# Patient Record
Sex: Male | Born: 1965 | Race: Black or African American | Hispanic: No | Marital: Married | Smoking: Never smoker
Health system: Southern US, Community
[De-identification: ages and names within clinical notes are randomized; demographics above are authoritative.]

## PROBLEM LIST (undated history)

## (undated) DIAGNOSIS — T7840XA Allergy, unspecified, initial encounter: Secondary | ICD-10-CM

## (undated) DIAGNOSIS — Z91010 Allergy to peanuts: Secondary | ICD-10-CM

## (undated) DIAGNOSIS — S83209A Unspecified tear of unspecified meniscus, current injury, unspecified knee, initial encounter: Secondary | ICD-10-CM

## (undated) DIAGNOSIS — H409 Unspecified glaucoma: Secondary | ICD-10-CM

## (undated) DIAGNOSIS — S83249A Other tear of medial meniscus, current injury, unspecified knee, initial encounter: Secondary | ICD-10-CM

## (undated) HISTORY — DX: Unspecified tear of unspecified meniscus, current injury, unspecified knee, initial encounter: S83.209A

## (undated) HISTORY — DX: Allergy to peanuts: Z91.010

## (undated) HISTORY — DX: Allergy, unspecified, initial encounter: T78.40XA

## (undated) HISTORY — PX: EYE SURGERY: SHX253

---

## 2005-02-24 ENCOUNTER — Ambulatory Visit (HOSPITAL_COMMUNITY): Admission: RE | Admit: 2005-02-24 | Discharge: 2005-02-24 | Payer: Self-pay | Admitting: Cardiology

## 2006-08-05 ENCOUNTER — Emergency Department (HOSPITAL_COMMUNITY): Admission: EM | Admit: 2006-08-05 | Discharge: 2006-08-05 | Payer: Self-pay | Admitting: Emergency Medicine

## 2007-09-14 ENCOUNTER — Emergency Department (HOSPITAL_COMMUNITY): Admission: EM | Admit: 2007-09-14 | Discharge: 2007-09-15 | Payer: Self-pay | Admitting: Emergency Medicine

## 2010-01-13 ENCOUNTER — Emergency Department (HOSPITAL_COMMUNITY): Admission: EM | Admit: 2010-01-13 | Discharge: 2010-01-13 | Payer: Self-pay | Admitting: Family Medicine

## 2010-02-26 ENCOUNTER — Emergency Department (HOSPITAL_COMMUNITY): Admission: EM | Admit: 2010-02-26 | Discharge: 2010-02-27 | Payer: Self-pay | Admitting: Emergency Medicine

## 2010-04-06 ENCOUNTER — Encounter: Admission: RE | Admit: 2010-04-06 | Discharge: 2010-04-06 | Payer: Self-pay | Admitting: Family Medicine

## 2011-09-30 ENCOUNTER — Ambulatory Visit: Payer: BC Managed Care – PPO

## 2011-10-08 ENCOUNTER — Encounter (INDEPENDENT_AMBULATORY_CARE_PROVIDER_SITE_OTHER): Payer: BC Managed Care – PPO

## 2011-10-08 DIAGNOSIS — Z0000 Encounter for general adult medical examination without abnormal findings: Secondary | ICD-10-CM

## 2011-11-02 ENCOUNTER — Emergency Department (HOSPITAL_COMMUNITY)
Admission: EM | Admit: 2011-11-02 | Discharge: 2011-11-02 | Discharge status: Absent without leave | Payer: Self-pay | Source: Home / Self Care

## 2011-11-05 ENCOUNTER — Encounter: Payer: Self-pay | Admitting: Family Medicine

## 2011-11-07 ENCOUNTER — Telehealth: Payer: Self-pay

## 2011-11-07 NOTE — Telephone Encounter (Signed)
PT RETURNING A CALL FROM OUR OFFICE NOT SURE IF IT IS ABOUT LABS, ED RX THAT NEEDED PRIOR AUTH   PT PHONE NUMBER IS ONLY A TEMPORARY NUMBER [phone]

## 2011-11-18 NOTE — Telephone Encounter (Signed)
LM WITH LADY TO CB.  IT LOOKS LIKE IN HIS CHART THAT WE NEEDED TO KNOW IF HE WANTED TO TRY VIAGRA INSTEAD

## 2011-11-20 ENCOUNTER — Telehealth: Payer: Self-pay

## 2011-11-20 NOTE — Telephone Encounter (Signed)
Pt calling back concerning last phone message. Asked pt if he would like to try a Rx for Viagra since his insurance does not cover Cialis. Pt stated he would like a Rx sent to [facility], [place]. Told pt how to get a coupon for free sample off of the Viagra website. Dr Patsy Lager, do you want to RX?

## 2011-11-20 NOTE — Telephone Encounter (Signed)
LMOM for pt that we were trying to reach him back again about a Rx that he had requested. Asked pt to CB if he still needs anything.

## 2011-11-23 ENCOUNTER — Other Ambulatory Visit: Payer: Self-pay | Admitting: Family Medicine

## 2011-11-23 MED ORDER — SILDENAFIL CITRATE 100 MG PO TABS: 50.0000 mg | ORAL_TABLET | Freq: Every day | ORAL | Status: DC | PRN

## 2011-11-25 NOTE — Progress Notes (Signed)
Called and told wife that Rx was mailed to pt.

## 2011-11-26 NOTE — Telephone Encounter (Signed)
Dr Patsy Lager sent in Rx for Viagra on 11/23/11

## 2011-11-26 NOTE — Progress Notes (Signed)
This encounter has been completed °

## 2012-06-28 ENCOUNTER — Ambulatory Visit (INDEPENDENT_AMBULATORY_CARE_PROVIDER_SITE_OTHER): Admitting: Physician Assistant

## 2012-06-28 VITALS — BP 132/82 | HR 51 | Temp 98.7°F | Resp 18 | Ht 69.0 in | Wt 165.0 lb

## 2012-06-28 DIAGNOSIS — Z23 Encounter for immunization: Secondary | ICD-10-CM

## 2012-06-28 NOTE — Progress Notes (Signed)
  Subjective:    Patient ID: Alejandro Beltran, male    DOB: 03-16-1966, 46 y.o.   MRN: [id number]  HPI  Pt presents to clinic with the need for TDap.  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  Unsure of when his last tetanus was.  Review of Systems     Objective:   Physical Exam  Vitals reviewed. Constitutional: He is oriented to person, place, and time. He appears well-developed and well-nourished.  HENT:  Head: Normocephalic and atraumatic.  Right Ear: External ear normal.  Left Ear: External ear normal.  Pulmonary/Chest: Effort normal.  Neurological: He is alert and oriented to person, place, and time.  Skin: Skin is warm and dry.  Psychiatric: He has a normal mood and affect. His behavior is normal. Judgment and thought content normal.          Assessment & Plan:   1. Need for diphtheria-tetanus-pertussis (Tdap) vaccine, adult/adolescent  Tdap vaccine greater than or equal to 7yo IM

## 2012-10-08 ENCOUNTER — Encounter: Payer: Self-pay | Admitting: Family Medicine

## 2012-10-08 ENCOUNTER — Ambulatory Visit (INDEPENDENT_AMBULATORY_CARE_PROVIDER_SITE_OTHER): Payer: 59 | Admitting: Family Medicine

## 2012-10-08 VITALS — BP 114/64 | HR 55 | Temp 97.8°F | Resp 16 | Ht 67.25 in | Wt 163.4 lb

## 2012-10-08 DIAGNOSIS — R51 Headache: Secondary | ICD-10-CM

## 2012-10-08 MED ORDER — TRAMADOL HCL 50 MG PO TABS: 50.0000 mg | ORAL_TABLET | Freq: Three times a day (TID) | ORAL | Status: DC | PRN
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
## 2012-10-08 NOTE — Patient Instructions (Addendum)
Use the tramadol as needed for headache.  If the headache is becoming worse, is not resolved in the next 2 days, or is associated with any other symptoms such as nausea, vomiting or weakness please let us know or otherwise seek care right away.

## 2012-11-11 ENCOUNTER — Ambulatory Visit (INDEPENDENT_AMBULATORY_CARE_PROVIDER_SITE_OTHER): Payer: 59 | Admitting: Family Medicine

## 2012-11-11 VITALS — BP 127/83 | HR 59 | Temp 98.0°F | Resp 16 | Ht 67.5 in | Wt 163.8 lb

## 2012-11-11 DIAGNOSIS — S0591XA Unspecified injury of right eye and orbit, initial encounter: Secondary | ICD-10-CM | POA: Insufficient documentation

## 2012-11-11 LAB — COMPREHENSIVE METABOLIC PANEL
ALT: 30 U/L (ref 0–53)
AST: 37 U/L (ref 0–37)
Alkaline Phosphatase: 62 U/L (ref 39–117)
Calcium: 9.7 mg/dL (ref 8.4–10.5)
Chloride: 99 mEq/L (ref 96–112)
Potassium: 4.4 mEq/L (ref 3.5–5.3)
Total Bilirubin: 0.6 mg/dL (ref 0.3–1.2)

## 2012-11-11 LAB — POCT CBC
Granulocyte percent: 68.3 %G (ref 37–80)
HCT, POC: 49.4 % (ref 43.5–53.7)
Hemoglobin: 15.5 g/dL (ref 14.1–18.1)
MPV: 9 fL (ref 0–99.8)
POC MID %: 7.1 %M (ref 0–12)
Platelet Count, POC: 209 10*3/uL (ref 142–424)
RBC: 5.29 M/uL (ref 4.69–6.13)
RDW, POC: 13.5 %

## 2012-11-11 LAB — POCT URINALYSIS DIPSTICK
Glucose, UA: NEGATIVE
Nitrite, UA: NEGATIVE
Urobilinogen, UA: 0.2
pH, UA: 7

## 2012-11-11 LAB — POCT UA - MICROSCOPIC ONLY
Bacteria, U Microscopic: NEGATIVE
RBC, urine, microscopic: NEGATIVE
WBC, Ur, HPF, POC: NEGATIVE

## 2012-11-11 LAB — LIPID PANEL
Cholesterol: 184 mg/dL (ref 0–200)
HDL: 54 mg/dL (ref >39)
Triglycerides: 44 mg/dL (ref <150)

## 2012-11-11 NOTE — Progress Notes (Signed)
[facility] [address], [place] [address] [phone]  Date:  11/11/2012   Name:  Alejandro Beltran   DOB:  1965-10-04   MRN:  [id number]  PCP:  No primary provider on file.    Chief Complaint: Annual Exam   History of Present Illness:  Alejandro Beltran is a 47 y.o. very pleasant male patient who presents with the following:  Here for a CPE today.  He is generally very heathy. He has been exercising as much as possible and following a healthy lifestyle He is doing well.  He has had a couple of HA as of late, but advil got rid of his symptoms.   He is fasting today- he would like to have his health maintenance labs as well as STI screening today.  He has no new partners but would like to be tested to be sure all negative ROS sheet is negative except for episodic LBP which has been with him for years. No current flare.   There is no problem list on file for this patient.   History reviewed. No pertinent past medical history.  Past Surgical History  Procedure Laterality Date  . Eye surgery      History  Substance Use Topics  . Smoking status: Never Smoker   . Smokeless tobacco: Not on file  . Alcohol Use: 1.0 oz/week    2 drink(s) per week    Family History  Problem Relation Age of Onset  . Hypertension Mother   . Anuerysm Father   . Diabetes Maternal Grandmother   . Stroke Maternal Grandmother   . Cancer Maternal Grandfather     Allergies  Allergen Reactions  . Peanuts (Peanut Oil)     Exotic nuts    Medication list has been reviewed and updated.  Current Outpatient Prescriptions on File Prior to Visit  Medication Sig Dispense Refill  . Multiple Vitamin (MULTIVITAMIN) tablet Take 1 tablet by mouth daily.      . sildenafil (VIAGRA) 100 MG tablet Take 0.5-1 tablets (50-100 mg total) by mouth daily as needed for erectile dysfunction. Start with a half tablet- can take a whole tablet if necessary  5 tablet  11  . traMADol (ULTRAM) 50 MG  tablet Take 1 tablet (50 mg total) by mouth every 8 (eight) hours as needed for pain.  30 tablet  0   No current facility-administered medications on file prior to visit.    Review of Systems:  As per HPI- otherwise negative.   Physical Examination: Filed Vitals:   11/11/12 1526  BP: 127/83  Pulse: 59  Temp: 98 F (36.7 C)  Resp: 16   Filed Vitals:   11/11/12 1526  Height: 5' 7.5" (1.715 m)  Weight: 163 lb 12.8 oz (74.299 kg)   Body mass index is 25.26 kg/(m^2). Ideal Body Weight: Weight in (lb) to have BMI = 25: 161.7  GEN: WDWN, NAD, Non-toxic, A & O x 3, trim build HEENT: Atraumatic, Normocephalic. Neck supple. No masses, No LAD.  Bilateral TM wnl, oropharynx normal.  PEERL,EOMI.   Ears and Nose: No external deformity. CV: RRR, No M/G/R. No JVD. No thrill. No extra heart sounds. PULM: CTA B, no wheezes, crackles, rhonchi. No retractions. No resp. distress. No accessory muscle use. ABD: S, NT, ND, +BS. No rebound. No HSM. EXTR: No c/c/e NEURO Normal gait.  PSYCH: Normally interactive. Conversant. Not depressed or anxious appearing.  Calm demeanor.  Gu: normal scrotum and contents, normal penis, DRE  is normal, prostate smooth  Results for orders placed in visit on 11/11/12  POCT CBC      Result Value Range   WBC 7.7  4.6 - 10.2 K/uL   Lymph, poc 1.9  0.6 - 3.4   POC LYMPH PERCENT 24.6  10 - 50 %L   MID (cbc) 0.5  0 - 0.9   POC MID % 7.1  0 - 12 %M   POC Granulocyte 5.3  2 - 6.9   Granulocyte percent 68.3  37 - 80 %G   RBC 5.29  4.69 - 6.13 M/uL   Hemoglobin 15.5  14.1 - 18.1 g/dL   HCT, POC 09.8  11.9 - 53.7 %   MCV 93.3  80 - 97 fL   MCH, POC 29.3  27 - 31.2 pg   MCHC 31.4 (*) 31.8 - 35.4 g/dL   RDW, POC 14.7     Platelet Count, POC 209  142 - 424 K/uL   MPV 9.0  0 - 99.8 fL  POCT URINALYSIS DIPSTICK      Result Value Range   Color, UA yellow     Clarity, UA clear     Glucose, UA neg     Bilirubin, UA neg     Ketones, UA neg     Spec Grav, UA 1.010      Blood, UA trace-intact     pH, UA 7.0     Protein, UA neg     Urobilinogen, UA 0.2     Nitrite, UA neg     Leukocytes, UA Negative    POCT UA - MICROSCOPIC ONLY      Result Value Range   WBC, Ur, HPF, POC neg     RBC, urine, microscopic neg     Bacteria, U Microscopic neg     Mucus, UA neg     Epithelial cells, urine per micros neg     Crystals, Ur, HPF, POC neg     Casts, Ur, LPF, POC neg     Yeast, UA neg      Assessment and Plan: Physical exam, annual - Plan: POCT CBC, Comprehensive metabolic panel, Lipid panel, PSA, POCT urinalysis dipstick, POCT UA - Microscopic Only, GC/chlamydia probe amp, urine, HIV antibody, RPR, Hepatitis B surface antigen, Hepatitis C antibody  Right eye injury  healthy male here for CPE.  Await labs for further follow- up Mild microhematuria- will plan to recheck a UA in about one month. Discussed with patient and ordered future labs.   Abbe Amsterdam, MD

## 2012-11-11 NOTE — Patient Instructions (Addendum)
I will be in touch with your labs as soon as they come in.  Let me know if you have any other problems in the meantime

## 2012-11-12 ENCOUNTER — Encounter: Payer: Self-pay | Admitting: Family Medicine

## 2012-11-12 LAB — HEPATITIS B SURFACE ANTIGEN: Hepatitis B Surface Ag: NEGATIVE

## 2012-11-12 LAB — HEPATITIS C ANTIBODY: HCV Ab: NEGATIVE

## 2012-11-12 NOTE — Addendum Note (Signed)
Addended by: Abbe Amsterdam C on: 11/12/2012 11:14 AM   Modules accepted: Orders

## 2012-11-12 NOTE — Progress Notes (Signed)
Old PSA numbers- 09/2011 0.66; 08/2007 0.6

## 2013-01-25 ENCOUNTER — Other Ambulatory Visit: Payer: Self-pay | Admitting: Emergency Medicine

## 2013-01-25 DIAGNOSIS — M25562 Pain in left knee: Secondary | ICD-10-CM

## 2013-01-26 ENCOUNTER — Ambulatory Visit (HOSPITAL_COMMUNITY): Admission: RE | Admit: 2013-01-26 | Payer: 59 | Source: Ambulatory Visit

## 2013-01-28 ENCOUNTER — Other Ambulatory Visit (HOSPITAL_COMMUNITY): Payer: 59

## 2013-01-28 ENCOUNTER — Ambulatory Visit (HOSPITAL_COMMUNITY)
Admission: RE | Admit: 2013-01-28 | Discharge: 2013-01-28 | Disposition: A | Discharge status: Home / Self Care | Payer: 59 | Source: Ambulatory Visit | Attending: Emergency Medicine | Admitting: Emergency Medicine

## 2013-01-28 DIAGNOSIS — M2240 Chondromalacia patellae, unspecified knee: Secondary | ICD-10-CM | POA: Insufficient documentation

## 2013-01-28 DIAGNOSIS — M25469 Effusion, unspecified knee: Secondary | ICD-10-CM | POA: Insufficient documentation

## 2013-01-28 DIAGNOSIS — M25562 Pain in left knee: Secondary | ICD-10-CM

## 2013-01-28 DIAGNOSIS — M25569 Pain in unspecified knee: Secondary | ICD-10-CM | POA: Insufficient documentation

## 2013-01-28 DIAGNOSIS — M6789 Other specified disorders of synovium and tendon, multiple sites: Secondary | ICD-10-CM | POA: Insufficient documentation

## 2013-03-23 ENCOUNTER — Ambulatory Visit (INDEPENDENT_AMBULATORY_CARE_PROVIDER_SITE_OTHER): Payer: 59 | Admitting: Family Medicine

## 2013-03-23 VITALS — BP 142/90 | HR 61 | Temp 98.1°F | Resp 16 | Ht 67.5 in | Wt 159.6 lb

## 2013-03-23 DIAGNOSIS — K625 Hemorrhage of anus and rectum: Secondary | ICD-10-CM

## 2013-03-23 DIAGNOSIS — M79609 Pain in unspecified limb: Secondary | ICD-10-CM | POA: Diagnosis not present

## 2013-03-23 DIAGNOSIS — M25562 Pain in left knee: Secondary | ICD-10-CM

## 2013-03-23 DIAGNOSIS — R3129 Other microscopic hematuria: Secondary | ICD-10-CM | POA: Diagnosis not present

## 2013-03-23 DIAGNOSIS — M25569 Pain in unspecified knee: Secondary | ICD-10-CM | POA: Diagnosis not present

## 2013-03-23 DIAGNOSIS — Z889 Allergy status to unspecified drugs, medicaments and biological substances status: Secondary | ICD-10-CM

## 2013-03-23 DIAGNOSIS — M79661 Pain in right lower leg: Secondary | ICD-10-CM

## 2013-03-23 DIAGNOSIS — Z9109 Other allergy status, other than to drugs and biological substances: Secondary | ICD-10-CM

## 2013-03-23 LAB — IFOBT (OCCULT BLOOD): IFOBT: NEGATIVE

## 2013-03-23 LAB — POCT URINALYSIS DIPSTICK
Bilirubin, UA: NEGATIVE
Blood, UA: NEGATIVE
Glucose, UA: NEGATIVE
Ketones, UA: 15
Nitrite, UA: NEGATIVE
Protein, UA: NEGATIVE
Spec Grav, UA: 1.015
pH, UA: 6

## 2013-03-23 MED ORDER — EPINEPHRINE 0.3 MG/0.3ML IJ SOAJ: 0.3000 mg | Freq: Once | INTRAMUSCULAR | Status: DC

## 2013-03-23 NOTE — Patient Instructions (Addendum)
We will be in touch with you regarding your GI referral.  If you do not hear from [place] in the next week or so please let us know.   If any other problems please be in touch Please give your orthopedist a call and schedule a follow-up.

## 2013-03-23 NOTE — Progress Notes (Signed)
[facility] [address] [place] [address] [phone]  Date:  03/23/2013   Name:  Alejandro Beltran   DOB:  [birth year]   MRN:  [id number]  PCP:  Orson Aloe MD, Tanna Furry, MD    Chief Complaint: Leg Pain, Knee Pain, 3 weeks ago saw blood in stool - want to get check and Medication Refill   History of Present Illness:  Alejandro Beltran is a 47 y.o. very pleasant male patient who presents with the following:  He has noted some pain in his right knee since April of this year.  He went to [facility] ortho and has an MRI and had a cortisone shot.  He was told he has a meniscal tear (he thinks) per MRI. He felt "great" after the shot for a pretty good while.  He has been running for exercise and did a 10k about a month ago.  Then yesterday he noted return of the knee pain while he was doing yoga.  It is not giving way, and he is able to get around.  However, he is not sure what he should do next.    2 days ago he was running and somehow hurt his right calf. He was able to keep running, but did notice pain.  His calf still feels a little tight and sore.  He is supposed to run the [place] 10[place] this weekend, but is not sure if he will be able to do this.   About 3 weeks ago he noted some blood in his stool. He has bright red bleeding, just one episode.  He did not notice any pain.  The stool was not hard.  He has blood in the toilet bowl, and is not sure if it was mixed in his stool.    He also needs a refill of his epipen (peanut allergy) and needs to recheck his UA from his visit in February.    Patient Active Problem List   Diagnosis Date Noted  . Right eye injury 11/11/2012    History reviewed. No pertinent past medical history.  Past Surgical History  Procedure Laterality Date  . Eye surgery      History  Substance Use Topics  . Smoking status: Never Smoker   . Smokeless tobacco: Not on file  . Alcohol Use: 1.0 oz/week    2 drink(s) per week    Family History   Problem Relation Age of Onset  . Hypertension Mother   . Anuerysm Father   . Diabetes Maternal Grandmother   . Stroke Maternal Grandmother   . Cancer Maternal Grandfather     Allergies  Allergen Reactions  . Peanuts (Peanut Oil)     Exotic nuts    Medication list has been reviewed and updated.  Current Outpatient Prescriptions on File Prior to Visit  Medication Sig Dispense Refill  . Multiple Vitamin (MULTIVITAMIN) tablet Take 1 tablet by mouth daily.      . sildenafil (VIAGRA) 100 MG tablet Take 0.5-1 tablets (50-100 mg total) by mouth daily as needed for erectile dysfunction. Start with a half tablet- can take a whole tablet if necessary  5 tablet  11  . traMADol (ULTRAM) 50 MG tablet Take 1 tablet (50 mg total) by mouth every 8 (eight) hours as needed for pain.  30 tablet  0   No current facility-administered medications on file prior to visit.    Review of Systems:  As per HPI- otherwise negative.   Physical Examination:  Filed Vitals:   03/23/13 1834  BP: 142/90  Pulse: 61  Temp: 98.1 F (36.7 C)  Resp: 16   Filed Vitals:   03/23/13 1834  Height: 5' 7.5" (1.715 m)  Weight: 159 lb 9.6 oz (72.394 kg)   Body mass index is 24.61 kg/(m^2). Ideal Body Weight: Weight in (lb) to have BMI = 25: 161.7  GEN: WDWN, NAD, Non-toxic, A & O x 3, looks well.   HEENT: Atraumatic, Normocephalic. Neck supple. No masses, No LAD. Ears and Nose: No external deformity. CV: RRR, No M/G/R. No JVD. No thrill. No extra heart sounds. PULM: CTA B, no wheezes, crackles, rhonchi. No retractions. No resp. distress. No accessory muscle use. ABD: S, NT, ND, +BS. No rebound. No HSM. EXTR: No c/c/e NEURO Normal gait.   Normal LE strength, DTR bilaterally PSYCH: Normally interactive. Conversant. Not depressed or anxious appearing.  Calm demeanor.  Left knee: mild tenderness along the medial joint line. Normal flexion and extension.  No crepitus or effusion Right calf:  He has a small area of  tenderness along the central gastroc muscle.  Achilles is intact and non- tender.  Normal strength of the calf muscle.   Rectal exam: normal, no bleeding or hemorhoid noted  Results for orders placed in visit on 03/23/13  POCT URINALYSIS DIPSTICK      Result Value Range   Color, UA yellow     Clarity, UA clear     Glucose, UA neg     Bilirubin, UA neg     Ketones, UA 15     Spec Grav, UA 1.015     Blood, UA neg     pH, UA 6.0     Protein, UA neg     Urobilinogen, UA 0.2     Nitrite, UA neg     Leukocytes, UA Negative    IFOBT (OCCULT BLOOD)      Result Value Range   IFOBT Negative       Assessment and Plan: Knee pain, left - Plan: EPINEPHrine (EPIPEN) 0.3 mg/0.3 mL SOAJ  History of allergic reaction - Plan: EPINEPHrine (EPIPEN) 0.3 mg/0.3 mL SOAJ, Ambulatory referral to Gastroenterology  Rectal bleeding - Plan: IFOBT POC (occult bld, rslt in office)  Calf pain, right  Microhematuria - Plan: POCT urinalysis dipstick  He will call MW tomorrow and schedule a recheck.  As he is injured, advised him not to run the 10K this weekend.  He may need a repair of his meniscal tear as he is a young and active person Refilled epipen Calf strain: no evidence of a serious injury.  Encouraged relative rest and gentle stretching.  He is using a heat patch and a calf brace- may continue these measures as needed.  Referral to GI for rectal bleeding.  Hemorrhoid seems likely but he is close to 50, so suspect GI will favor a colonoscopy Microhematuria (on dip only, not on micro at last OV) is now resolved.    Signed Abbe Amsterdam, MD

## 2013-03-29 ENCOUNTER — Encounter: Payer: Self-pay | Admitting: Gastroenterology

## 2013-04-12 ENCOUNTER — Encounter: Payer: Self-pay | Admitting: Gastroenterology

## 2013-04-12 ENCOUNTER — Ambulatory Visit (INDEPENDENT_AMBULATORY_CARE_PROVIDER_SITE_OTHER): Payer: 59 | Admitting: Gastroenterology

## 2013-04-12 VITALS — BP 100/68 | HR 60 | Ht 67.5 in | Wt 163.8 lb

## 2013-04-12 DIAGNOSIS — K921 Melena: Secondary | ICD-10-CM

## 2013-04-12 MED ORDER — PEG-KCL-NACL-NASULF-NA ASC-C 100 G PO SOLR: 1.0000 | Freq: Once | ORAL | Status: DC

## 2013-04-12 NOTE — Patient Instructions (Addendum)
You have been scheduled for a colonoscopy with propofol. Please follow written instructions given to you at your visit today.  Please pick up your prep kit at the pharmacy within the next 1-3 days. If you use inhalers (even only as needed), please bring them with you on the day of your procedure. Your physician has requested that you go to www.startemmi.com and enter the access code given to you at your visit today. This web site gives a general overview about your procedure. However, you should still follow specific instructions given to you by our office regarding your preparation for the procedure.  Thank you for choosing me and [facility].  Malcolm T. Stark, Jr., MD., FACG  

## 2013-04-12 NOTE — Progress Notes (Signed)
History of Present Illness: This is a 47 year old male with a history of hematochezia associated with a bowel movement on one occasion about 2 months ago. It has not recurred. He has no other GI complaints. Denies weight loss, abdominal pain, constipation, diarrhea, change in stool caliber, melena, nausea, vomiting, dysphagia, reflux symptoms, chest pain.  Review of Systems: Pertinent positive and negative review of systems were noted in the above HPI section. All other review of systems were otherwise negative.  Current Medications, Allergies, Past Medical History, Past Surgical History, Family History and Social History were reviewed in [employer] record.  Physical Exam: General: Well developed , well nourished, no acute distress Head: Normocephalic and atraumatic Eyes:  sclerae anicteric, EOMI Ears: Normal auditory acuity Mouth: No deformity or lesions Neck: Supple, no masses or thyromegaly Lungs: Clear throughout to auscultation Heart: Regular rate and rhythm; no murmurs, rubs or bruits Abdomen: Soft, non tender and non distended. No masses, hepatosplenomegaly or hernias noted. Normal Bowel sounds Rectal: Deferred to colonoscopy Musculoskeletal: Symmetrical with no gross deformities  Skin: No lesions on visible extremities Pulses:  Normal pulses noted Extremities: No clubbing, cyanosis, edema or deformities noted Neurological: Alert oriented x 4, grossly nonfocal Cervical Nodes:  No significant cervical adenopathy Inguinal Nodes: No significant inguinal adenopathy Psychological:  Alert and cooperative. Normal mood and affect  Assessment and Recommendations:  1. Hematochezia. I suspect a benign anorectal source of bleeding such as hemorrhoids. Need to exclude colorectal neoplasms. The risks, benefits, and alternatives to colonoscopy with possible biopsy and possible polypectomy were discussed with the patient and they consent to proceed.

## 2013-04-13 ENCOUNTER — Ambulatory Visit (AMBULATORY_SURGERY_CENTER): Payer: 59 | Admitting: Gastroenterology

## 2013-04-13 ENCOUNTER — Encounter: Payer: Self-pay | Admitting: Gastroenterology

## 2013-04-13 VITALS — BP 110/76 | HR 56 | Temp 96.8°F | Resp 34 | Ht 67.5 in | Wt 163.0 lb

## 2013-04-13 DIAGNOSIS — K921 Melena: Secondary | ICD-10-CM

## 2013-04-13 MED ORDER — SODIUM CHLORIDE 0.9 % IV SOLN: 500.0000 mL | INTRAVENOUS | Status: DC

## 2013-04-13 NOTE — Progress Notes (Signed)
Procedure ends, to recovery, report given and VSS. 

## 2013-04-13 NOTE — Op Note (Signed)
[facility] [address] [place], [address]   COLONOSCOPY PROCEDURE REPORT  PATIENT: Alejandro, Beltran  MR#: [id number] BIRTHDATE: [birth year] , 47  yrs. old GENDER: Male ENDOSCOPIST: Meryl Dare, MD, [facility] REFERRED XB:[id number] Copland, M.D. PROCEDURE DATE:  04/13/2013 PROCEDURE:   Colonoscopy, diagnostic ASA CLASS:   Class I INDICATIONS:hematochezia. MEDICATIONS: MAC sedation, administered by CRNA and propofol (Diprivan) 240mg  IV DESCRIPTION OF PROCEDURE:   After the risks benefits and alternatives of the procedure were thoroughly explained, informed consent was obtained.  A digital rectal exam revealed no abnormalities of the rectum.   The LB [id number]  endoscope was introduced through the anus and advanced to the cecum, which was identified by both the appendix and ileocecal valve. No adverse events experienced.   The quality of the prep was good, using MoviPrep  The instrument was then slowly withdrawn as the colon was fully examined.  COLON FINDINGS: A normal appearing cecum, ileocecal valve, and appendiceal orifice were identified.  The ascending, hepatic flexure, transverse, splenic flexure, descending, sigmoid colon and rectum appeared unremarkable.  No polyps or cancers were seen. Retroflexed views revealed moderate internal hemorrhoids. The time to cecum=1 minutes 11 seconds.  Withdrawal time=8 minutes 39 seconds.  The scope was withdrawn and the procedure completed.  COMPLICATIONS: There were no complications.  ENDOSCOPIC IMPRESSION: 1.  Normal colon 2.  Moderate internal hemorrhoids  RECOMMENDATIONS: 1.  Continue current colorectal screening recommendations for "routine risk" patients with a repeat colonoscopy in 10 years.   eSigned:  Meryl Dare, MD, [facility] 04/13/2013 3:08 PM

## 2013-04-13 NOTE — Progress Notes (Signed)
Patient did not experience any of the following events: a burn prior to discharge; a fall within the facility; wrong site/side/patient/procedure/implant event; or a hospital transfer or hospital admission upon discharge from the facility. (G8907) Patient did not have preoperative order for IV antibiotic SSI prophylaxis. (G8918)  

## 2013-04-13 NOTE — Patient Instructions (Addendum)
YOU HAD AN ENDOSCOPIC PROCEDURE TODAY AT THE [facility]: Refer to the procedure report that was given to you for any specific questions about what was found during the examination.  If the procedure report does not answer your questions, please call your gastroenterologist to clarify.  If you requested that your care partner not be given the details of your procedure findings, then the procedure report has been included in a sealed envelope for you to review at your convenience later.  YOU SHOULD EXPECT: Some feelings of bloating in the abdomen. Passage of more gas than usual.  Walking can help get rid of the air that was put into your GI tract during the procedure and reduce the bloating. If you had a lower endoscopy (such as a colonoscopy or flexible sigmoidoscopy) you may notice spotting of blood in your stool or on the toilet paper. If you underwent a bowel prep for your procedure, then you may not have a normal bowel movement for a few days.  DIET: Your first meal following the procedure should be a light meal and then it is ok to progress to your normal diet.  A half-sandwich or bowl of soup is an example of a good first meal.  Heavy or fried foods are harder to digest and may make you feel nauseous or bloated.  Likewise meals heavy in dairy and vegetables can cause extra gas to form and this can also increase the bloating.  Drink plenty of fluids but you should avoid alcoholic beverages for 24 hours.  ACTIVITY: Your care partner should take you home directly after the procedure.  You should plan to take it easy, moving slowly for the rest of the day.  You can resume normal activity the day after the procedure however you should NOT DRIVE or use heavy machinery for 24 hours (because of the sedation medicines used during the test).    SYMPTOMS TO REPORT IMMEDIATELY: A gastroenterologist can be reached at any hour.  During normal business hours, 8:30 AM to 5:00 PM Monday through Friday,  call [phone].  After hours and on weekends, please call the GI answering service at [phone] who will take a message and have the physician on call contact you.   Following lower endoscopy (colonoscopy or flexible sigmoidoscopy):  Excessive amounts of blood in the stool  Significant tenderness or worsening of abdominal pains  Swelling of the abdomen that is new, acute  Fever of 100F or higher   FOLLOW UP: If any biopsies were taken you will be contacted by phone or by letter within the next 1-3 weeks.  Call your gastroenterologist if you have not heard about the biopsies in 3 weeks.  Our staff will call the home number listed on your records the next business day following your procedure to check on you and address any questions or concerns that you may have at that time regarding the information given to you following your procedure. This is a courtesy call and so if there is no answer at the home number and we have not heard from you through the emergency physician on call, we will assume that you have returned to your regular daily activities without incident.  SIGNATURES/CONFIDENTIALITY: You and/or your care partner have signed paperwork which will be entered into your electronic medical record.  These signatures attest to the fact that that the information above on your After Visit Summary has been reviewed and is understood.  Full responsibility of the confidentiality of   this discharge information lies with you and/or your care-partner.  Information on hemorrhoids given to you today 

## 2013-04-15 ENCOUNTER — Telehealth: Payer: Self-pay | Admitting: *Deleted

## 2013-04-15 NOTE — Telephone Encounter (Signed)
Left message

## 2013-05-13 ENCOUNTER — Encounter (HOSPITAL_BASED_OUTPATIENT_CLINIC_OR_DEPARTMENT_OTHER): Payer: Self-pay | Admitting: *Deleted

## 2013-05-17 ENCOUNTER — Ambulatory Visit (HOSPITAL_BASED_OUTPATIENT_CLINIC_OR_DEPARTMENT_OTHER): Admission: RE | Admit: 2013-05-17 | Payer: 59 | Source: Ambulatory Visit | Admitting: Orthopedic Surgery

## 2013-05-17 HISTORY — DX: Unspecified glaucoma: H40.9

## 2013-05-17 HISTORY — DX: Other tear of medial meniscus, current injury, unspecified knee, initial encounter: S83.249A

## 2013-05-17 SURGERY — ARTHROSCOPY, KNEE, WITH MEDIAL MENISCECTOMY
Anesthesia: Choice | Site: Knee | Laterality: Left

## 2013-06-16 ENCOUNTER — Telehealth: Payer: Self-pay

## 2013-06-16 ENCOUNTER — Ambulatory Visit (INDEPENDENT_AMBULATORY_CARE_PROVIDER_SITE_OTHER): Payer: 59 | Admitting: Emergency Medicine

## 2013-06-16 DIAGNOSIS — T6391XA Toxic effect of contact with unspecified venomous animal, accidental (unintentional), initial encounter: Secondary | ICD-10-CM

## 2013-06-16 DIAGNOSIS — M25562 Pain in left knee: Secondary | ICD-10-CM

## 2013-06-16 DIAGNOSIS — Z889 Allergy status to unspecified drugs, medicaments and biological substances status: Secondary | ICD-10-CM

## 2013-06-16 MED ORDER — METHYLPREDNISOLONE ACETATE 80 MG/ML IJ SUSP
80.0000 mg | Freq: Once | INTRAMUSCULAR | Status: AC
  Administered 2013-06-16: 80 mg via INTRAMUSCULAR

## 2013-06-16 MED ORDER — EPINEPHRINE 0.3 MG/0.3ML IJ SOAJ: 0.3000 mg | Freq: Once | INTRAMUSCULAR | Status: DC

## 2013-06-16 NOTE — Progress Notes (Signed)
[facility] [address] [place] [address] [phone]  Date:  06/16/2013   Name:  Alejandro Beltran   DOB:  03-Dec-1965   MRN:  [id number]  PCP:  Hannah Beat, MD    Chief Complaint: Insect Bite   History of Present Illness:  Alejandro Beltran is a 47 y.o. very pleasant male patient who presents with the following:  Stung in the back of the neck by a wasp.  Has no history of insect allergy, rash, shortness of breath, difficulty swallowing or breathing.  No wheezing or shortness of breath.  No improvement with over the counter medications or other home remedies. Denies other complaint or health concern today.   Patient Active Problem List   Diagnosis Date Noted  . Right eye injury 11/11/2012    Past Medical History  Diagnosis Date  . Peanut allergy   . Acute meniscal tear of knee   . Glaucoma   . Acute medial meniscal tear     left    Past Surgical History  Procedure Laterality Date  . Eye surgery      rt retina sx    History  Substance Use Topics  . Smoking status: Never Smoker   . Smokeless tobacco: Never Used  . Alcohol Use: 1.0 oz/week    2 drink(s) per week     Comment: socially    Family History  Problem Relation Age of Onset  . Hypertension Mother   . Anuerysm Father   . Diabetes Maternal Grandmother   . Stroke Maternal Grandmother   . Cancer Maternal Grandfather   . Colon cancer Neg Hx     Allergies  Allergen Reactions  . Peanuts [Peanut Oil]     Exotic nuts    Medication list has been reviewed and updated.  Current Outpatient Prescriptions on File Prior to Visit  Medication Sig Dispense Refill  . brimonidine-timolol (COMBIGAN) 0.2-0.5 % ophthalmic solution Place 1 drop into both eyes every 12 (twelve) hours.      Marland Kitchen EPINEPHrine (EPIPEN) 0.3 mg/0.3 mL SOAJ injection Inject 0.3 mLs (0.3 mg total) into the muscle once.  2 Device  2  . Multiple Vitamin (MULTIVITAMIN) tablet Take 1 tablet by mouth daily.       No current  facility-administered medications on file prior to visit.    Review of Systems:  As per HPI, otherwise negative.     Physical Examination: Filed Vitals:   06/16/13 1626  BP: 120/76  Pulse: 62  Temp: 98.2 F (36.8 C)  Resp: 18   Filed Vitals:   06/16/13 1626  Height: 5\' 7"  (1.702 m)  Weight: 170 lb (77.111 kg)   Body mass index is 26.62 kg/(m^2). Ideal Body Weight: Weight in (lb) to have BMI = 25: 159.3   GEN: WDWN, NAD, Non-toxic, Alert & Oriented x 3 HEENT: Atraumatic, Normocephalic.  Ears and Nose: No external deformity. Chest Clear MS= EXTR: No clubbing/cyanosis/edema NEURO: Normal gait.  PSYCH: Normally interactive. Conversant. Not depressed or anxious appearing.  Calm demeanor.  SKIN:  No rash or swelling.  Assessment and Plan: Insect sting Benadryl Depo medrol   Signed,  Phillips Odor, MD

## 2013-06-16 NOTE — Telephone Encounter (Signed)
Pt would like to know if he could have an epipen called into his pharmacy [facility] Best# [phone]

## 2013-06-16 NOTE — Patient Instructions (Addendum)
Insect Sting Allergy An insect sting can cause pain, redness, and itching at the sting site. Symptoms of an allergic reaction are usually contained in the area of the sting site (localized). An allergic reaction usually occurs within minutes of an insect sting. Redness and swelling of the sting site may last as long as 1 week. SYMPTOMS   A local reaction at the sting site can cause:  Pain.  Redness.  Itching.  Swelling.  A systemic reaction can cause a reaction anywhere on your body. For example, you may develop the following:  Hives.  Generalized swelling.  Body aches.  Itching.  Dizziness.  Nausea or vomiting.  A more serious (anaphylactic) reaction can involve:  Difficulty breathing or wheezing.  Tongue or throat swelling.  Fainting. HOME CARE INSTRUCTIONS   If you are stung, look to see if the stinger is still in the skin. This can appear as a small, black dot at the sting site. The stinger can be removed by scraping it with a dull object such as a credit card or your fingernail. Do not use tweezers. Tweezers can squeeze the stinger and release more insect venom into the skin.  After the stinger has been removed, wash the sting site with soap and water or rubbing alcohol.  Put ice on the sting area.  Put ice in a plastic bag.  Place a towel between your skin and the bag.  Leave the ice on for 15-20 minutes, 3-4 times a day.  You can use a topical anti-itch cream, such as hydrocortisone cream, to help reduce itching.  You can take an oral antihistamine medicine to help decrease swelling and other symptoms.  Only take over-the-counter or prescription medicines for pain, discomfort, or fever as directed by your caregiver.  If prescribed, keep an epinephrine injection to temporarily treat emergency allergic reactions with you at all times. It is important to know how and when to give an epinephrine injection.  Avoid contact with stinging insects or the insect  thought to have caused your reaction.  Wear long pants when mowing grass or hiking. Wear gloves when gardening.  Use unscented deodorant and avoid strong perfumes when outdoors.  Wear a medical alert bracelet or necklace that describes your allergies.  Make sure your primary caregiver has a record of your insect sting reaction.  It may be helpful to consult with an allergy specialist. You may have other sensitivities that you are not aware of. SEEK IMMEDIATE MEDICAL CARE IF:  You experience wheezing or difficulty breathing.  You have difficulty swallowing, or you develop throat tightness.  You have mouth, tongue, or throat swelling.  You feel weak, or you faint.  You have coughing or a change in your voice.  You experience vomiting, diarrhea, or stomach cramps.  You have chest pain or lightheadedness.  You notice raised, red patches on the skin that itch. These may be early warning signs of a serious generalized or anaphylactic reaction. Call your local emergency services (911 in [place] immediately. MAKE SURE YOU:   Understand these instructions.  Will watch your condition.  Will get help right away if you are not doing well or get worse. FOR MORE INFORMATION American Academy of Allergy Asthma and Immunology: www.aaaai.org American College of Allergy, Asthma and Immunology: www.acaai.org Document Released: 08/07/2006 Document Revised: 12/01/2011 Document Reviewed: 09/18/2009 ExitCare Patient Information 2014 [employer].  

## 2013-06-16 NOTE — Telephone Encounter (Signed)
Epi pen sent in for him.

## 2013-06-23 LAB — HM COLONOSCOPY

## 2013-06-26 ENCOUNTER — Other Ambulatory Visit: Payer: Self-pay | Admitting: Physician Assistant

## 2013-06-26 DIAGNOSIS — Z91010 Allergy to peanuts: Secondary | ICD-10-CM | POA: Insufficient documentation

## 2013-06-26 DIAGNOSIS — S83207D Unspecified tear of unspecified meniscus, current injury, left knee, subsequent encounter: Secondary | ICD-10-CM

## 2013-06-26 DIAGNOSIS — S83242D Other tear of medial meniscus, current injury, left knee, subsequent encounter: Secondary | ICD-10-CM

## 2013-06-26 DIAGNOSIS — S83249A Other tear of medial meniscus, current injury, unspecified knee, initial encounter: Secondary | ICD-10-CM | POA: Insufficient documentation

## 2013-06-26 DIAGNOSIS — H409 Unspecified glaucoma: Secondary | ICD-10-CM | POA: Insufficient documentation

## 2013-06-26 DIAGNOSIS — S83209A Unspecified tear of unspecified meniscus, current injury, unspecified knee, initial encounter: Secondary | ICD-10-CM | POA: Insufficient documentation

## 2013-06-26 NOTE — H&P (Signed)
Alejandro Beltran is an 47 y.o. male.   Chief Complaint: left knee pain HPI: Alejandro Beltran is a 47 year old seen for follow-up from his persistent left knee pain with medial meniscus tear. 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 He is having pain on an intermittent basis. He had a left knee injection on 03/03/13 and this helped significantly but the pain has recurred.  He does walking/running 10K races and recently finished one.  Past Medical History  Diagnosis Date  . Peanut allergy   . Acute meniscal tear of knee   . Glaucoma   . Acute medial meniscal tear     left    Past Surgical History  Procedure Laterality Date  . Eye surgery      rt retina sx    Family History  Problem Relation Age of Onset  . Hypertension Mother   . Anuerysm Father   . Diabetes Maternal Grandmother   . Stroke Maternal Grandmother   . Cancer Maternal Grandfather   . Colon cancer Neg Hx    Social History:  reports that he has never smoked. He has never used smokeless tobacco. He reports that he drinks about 1.0 ounces of alcohol per week. He reports that he does not use illicit drugs.  Allergies:  Allergies  Allergen Reactions  . Peanuts [Peanut Oil]     Exotic nuts   Current Outpatient Prescriptions on File Prior to Visit  Medication Sig Dispense Refill  . brimonidine-timolol (COMBIGAN) 0.2-0.5 % ophthalmic solution Place 1 drop into both eyes every 12 (twelve) hours.      Marland Kitchen EPINEPHrine (EPIPEN) 0.3 mg/0.3 mL SOAJ injection Inject 0.3 mLs (0.3 mg total) into the muscle once.  2 Device  2  . Multiple Vitamin (MULTIVITAMIN) tablet Take 1 tablet by mouth daily.       No current facility-administered medications on file prior to visit.    (Not in a hospital admission)  No results found for this or any previous visit (from the past 48 hour(s)). No results found.  Review of Systems  Constitutional: Negative.   HENT:  Negative.   Eyes: Negative.   Respiratory: Negative.   Cardiovascular: Negative.   Gastrointestinal: Negative.   Genitourinary: Negative.   Musculoskeletal: Positive for joint pain.       Left knee pain  Skin: Negative.   Neurological: Negative.   Endo/Heme/Allergies: Negative.   Psychiatric/Behavioral: Negative.     Blood pressure 123/84, pulse 58, height 5\' 9"  (1.753 m), weight 74.844 kg (165 lb). Physical Exam  Constitutional: He is oriented to person, place, and time. He appears well-developed and well-nourished.  HENT:  Head: Normocephalic and atraumatic.  Eyes: Pupils are equal, round, and reactive to light.  Neck: Neck supple.  Cardiovascular: Normal rate.   Respiratory: Effort normal and breath sounds normal.  GI: Soft. Bowel sounds are normal.  Genitourinary:  Not pertinent to current symptomatology therefore not examined.  Musculoskeletal:  Examination of his left knee reveals pain on the medial joint line positive medial McMurray's 1+ effusion full range of motion knee is stable with normal patella tracking. Exam of the right knee reveals full range of motion without pain swelling weakness or instability. Vascular exam: pulses 2+ and symmetric.  Neurological: He is alert and oriented to person, place, and time.  Skin: Skin is warm and dry.  Psychiatric: He has a normal mood and affect.  Assessment Patient Active Problem List   Diagnosis Date Noted  . Peanut allergy   . Acute meniscal tear of knee   . Glaucoma   . Acute medial meniscal tear   . Right eye injury 11/11/2012    Plan We discussed left knee arthroscopy with attention to meniscal and chondral pathology. Discussed risks benefits and possible complications of the surgery in detail and he understands this completely.   Makya Phillis J 06/26/2013, 12:57 PM

## 2013-07-04 NOTE — Progress Notes (Signed)
UTR-have talked with wife x2 to have him call

## 2013-07-05 ENCOUNTER — Encounter (HOSPITAL_BASED_OUTPATIENT_CLINIC_OR_DEPARTMENT_OTHER): Admission: RE | Payer: Self-pay | Source: Ambulatory Visit

## 2013-07-05 ENCOUNTER — Ambulatory Visit (HOSPITAL_BASED_OUTPATIENT_CLINIC_OR_DEPARTMENT_OTHER): Admission: RE | Admit: 2013-07-05 | Payer: 59 | Source: Ambulatory Visit | Admitting: Orthopedic Surgery

## 2013-07-05 SURGERY — ARTHROSCOPY, KNEE, WITH MEDIAL MENISCECTOMY
Anesthesia: Choice | Site: Knee | Laterality: Left

## 2013-07-18 ENCOUNTER — Other Ambulatory Visit: Payer: Self-pay | Admitting: Family Medicine

## 2013-07-18 DIAGNOSIS — F411 Generalized anxiety disorder: Secondary | ICD-10-CM

## 2013-07-18 DIAGNOSIS — N529 Male erectile dysfunction, unspecified: Secondary | ICD-10-CM

## 2013-07-18 NOTE — Telephone Encounter (Signed)
Dr Patsy Lager, I don't see when you Rxd this for pt. Do you want to RF?

## 2013-07-19 NOTE — Telephone Encounter (Signed)
Called and LMOM- I am not sure if we have written this for him in the past.  We need to get a little more information and then I am glad to refill for him.  Will try him back later.

## 2013-07-20 MED ORDER — ALPRAZOLAM 0.25 MG PO TABS: 0.2500 mg | ORAL_TABLET | Freq: Two times a day (BID) | ORAL | Status: DC | PRN

## 2013-07-20 NOTE — Telephone Encounter (Signed)
Called to discuss with him.  Paper chart reveals that we have given him cialis. He is apparently now taking viagra with good results.   Alejandro Beltran also wishes to discuss anxiety and panic attacks.  He reports he has used lorazepam and xanax in the past. he is being bothered by panic attacks and feels that he could use some medication again.  He would like for me to rx a small amount of xanax, and he plans to see me within the next couple of weeks.  I will rx xanax 0.25 to his drug store and he will see me son

## 2014-08-08 ENCOUNTER — Ambulatory Visit (INDEPENDENT_AMBULATORY_CARE_PROVIDER_SITE_OTHER): Admitting: Family Medicine

## 2014-08-08 VITALS — BP 126/78 | HR 54 | Temp 97.5°F | Resp 18 | Ht 67.5 in | Wt 164.0 lb

## 2014-08-08 DIAGNOSIS — Z130 Encounter for screening for diseases of the blood and blood-forming organs and certain disorders involving the immune mechanism: Secondary | ICD-10-CM

## 2014-08-08 DIAGNOSIS — Z0000 Encounter for general adult medical examination without abnormal findings: Secondary | ICD-10-CM

## 2014-08-08 DIAGNOSIS — Z131 Encounter for screening for diabetes mellitus: Secondary | ICD-10-CM

## 2014-08-08 DIAGNOSIS — Z13220 Encounter for screening for lipoid disorders: Secondary | ICD-10-CM

## 2014-08-08 LAB — COMPREHENSIVE METABOLIC PANEL
ALT: 26 U/L (ref 0–53)
AST: 27 U/L (ref 0–37)
Albumin: 4.5 g/dL (ref 3.5–5.2)
Alkaline Phosphatase: 51 U/L (ref 39–117)
BILIRUBIN TOTAL: 0.4 mg/dL (ref 0.2–1.2)
BUN: 12 mg/dL (ref 6–23)
CALCIUM: 9.4 mg/dL (ref 8.4–10.5)
CHLORIDE: 102 meq/L (ref 96–112)
CO2: 32 mEq/L (ref 19–32)
CREATININE: 1 mg/dL (ref 0.50–1.35)
Glucose, Bld: 99 mg/dL (ref 70–99)
Potassium: 5.1 mEq/L (ref 3.5–5.3)
Sodium: 140 mEq/L (ref 135–145)
Total Protein: 7.1 g/dL (ref 6.0–8.3)

## 2014-08-08 LAB — POCT URINALYSIS DIPSTICK
BILIRUBIN UA: NEGATIVE
Glucose, UA: NEGATIVE
Ketones, UA: NEGATIVE
LEUKOCYTES UA: NEGATIVE
NITRITE UA: NEGATIVE
PH UA: 7
PROTEIN UA: NEGATIVE
RBC UA: NEGATIVE
Spec Grav, UA: 1.02
Urobilinogen, UA: 1

## 2014-08-08 LAB — POCT CBC
Granulocyte percent: 57.9 %G (ref 37–80)
HEMATOCRIT: 47.4 % (ref 43.5–53.7)
HEMOGLOBIN: 15 g/dL (ref 14.1–18.1)
LYMPH, POC: 2.1 (ref 0.6–3.4)
MCH: 29 pg (ref 27–31.2)
MCHC: 31.6 g/dL — AB (ref 31.8–35.4)
MCV: 91.7 fL (ref 80–97)
MID (cbc): 0.4 (ref 0–0.9)
MPV: 7.7 fL (ref 0–99.8)
POC GRANULOCYTE: 3.4 (ref 2–6.9)
POC LYMPH %: 35.5 % (ref 10–50)
POC MID %: 6.6 %M (ref 0–12)
Platelet Count, POC: 179 10*3/uL (ref 142–424)
RBC: 5.17 M/uL (ref 4.69–6.13)
RDW, POC: 14.4 %
WBC: 5.8 10*3/uL (ref 4.6–10.2)

## 2014-08-08 LAB — POCT UA - MICROSCOPIC ONLY
Bacteria, U Microscopic: NEGATIVE
CASTS, UR, LPF, POC: NEGATIVE
CRYSTALS, UR, HPF, POC: NEGATIVE
MUCUS UA: NEGATIVE
YEAST UA: NEGATIVE

## 2014-08-08 LAB — LIPID PANEL
CHOL/HDL RATIO: 2.9 ratio
CHOLESTEROL: 157 mg/dL (ref 0–200)
HDL: 54 mg/dL (ref >39)
LDL Cholesterol: 91 mg/dL (ref 0–99)
Triglycerides: 59 mg/dL (ref <150)
VLDL: 12 mg/dL (ref 0–40)
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
## 2014-08-08 NOTE — Patient Instructions (Signed)

## 2014-08-09 ENCOUNTER — Encounter: Payer: Self-pay | Admitting: Physician Assistant

## 2014-10-19 ENCOUNTER — Other Ambulatory Visit: Payer: Self-pay | Admitting: Orthopedic Surgery

## 2014-10-19 DIAGNOSIS — M25561 Pain in right knee: Secondary | ICD-10-CM

## 2014-10-19 DIAGNOSIS — M170 Bilateral primary osteoarthritis of knee: Secondary | ICD-10-CM

## 2014-10-28 ENCOUNTER — Ambulatory Visit
Admission: RE | Admit: 2014-10-28 | Discharge: 2014-10-28 | Disposition: A | Discharge status: Home / Self Care | Payer: 59 | Source: Ambulatory Visit | Attending: Orthopedic Surgery | Admitting: Orthopedic Surgery

## 2014-10-28 DIAGNOSIS — M25561 Pain in right knee: Secondary | ICD-10-CM

## 2014-10-28 DIAGNOSIS — M170 Bilateral primary osteoarthritis of knee: Secondary | ICD-10-CM

## 2014-12-22 ENCOUNTER — Encounter: Payer: Self-pay | Admitting: Family Medicine

## 2014-12-22 DIAGNOSIS — M1712 Unilateral primary osteoarthritis, left knee: Secondary | ICD-10-CM | POA: Insufficient documentation

## 2015-08-23 HISTORY — PX: MENISCUS REPAIR: SHX5179

## 2015-10-12 ENCOUNTER — Encounter: Payer: Self-pay | Admitting: Family Medicine

## 2015-10-17 ENCOUNTER — Encounter: Payer: Self-pay | Admitting: Family Medicine

## 2015-11-21 ENCOUNTER — Encounter: Payer: Self-pay | Admitting: *Deleted

## 2015-11-21 ENCOUNTER — Telehealth: Payer: Self-pay | Admitting: *Deleted

## 2015-11-21 NOTE — Telephone Encounter (Signed)
Pre-Visit Call completed with patient and chart updated.   Pre-Visit Info documented in Specialty Comments under [employer].    

## 2015-11-22 ENCOUNTER — Encounter: Admitting: Family Medicine

## 2015-11-23 ENCOUNTER — Encounter: Payer: Self-pay | Admitting: Family Medicine

## 2015-11-23 ENCOUNTER — Telehealth: Payer: Self-pay | Admitting: Family Medicine

## 2015-11-23 NOTE — Telephone Encounter (Signed)
Pt called in to reschedule appt. She didn't give a reason as to why. Should pt be charged a no show fee?

## 2015-11-23 NOTE — Telephone Encounter (Signed)
Marked for no charge per msg from [employer]. Mailing letter to pt with no show policy.

## 2015-12-04 ENCOUNTER — Telehealth: Payer: Self-pay | Admitting: Behavioral Health

## 2015-12-04 NOTE — Telephone Encounter (Signed)
Attempted to reach patient for Pre-Visit Call. Per recording, the patient's voice mailbox has not been set up at this time. Pre-Visit Info was previously updated on 11/21/15.

## 2015-12-05 ENCOUNTER — Encounter: Payer: Self-pay | Admitting: Family Medicine

## 2015-12-05 ENCOUNTER — Ambulatory Visit (INDEPENDENT_AMBULATORY_CARE_PROVIDER_SITE_OTHER): Admitting: Family Medicine

## 2015-12-05 VITALS — BP 118/88 | HR 70 | Ht 67.0 in | Wt 156.0 lb

## 2015-12-05 DIAGNOSIS — Z130 Encounter for screening for diseases of the blood and blood-forming organs and certain disorders involving the immune mechanism: Secondary | ICD-10-CM | POA: Diagnosis not present

## 2015-12-05 DIAGNOSIS — Z13220 Encounter for screening for lipoid disorders: Secondary | ICD-10-CM

## 2015-12-05 DIAGNOSIS — Z113 Encounter for screening for infections with a predominantly sexual mode of transmission: Secondary | ICD-10-CM

## 2015-12-05 DIAGNOSIS — N528 Other male erectile dysfunction: Secondary | ICD-10-CM

## 2015-12-05 DIAGNOSIS — Z23 Encounter for immunization: Secondary | ICD-10-CM | POA: Diagnosis not present

## 2015-12-05 DIAGNOSIS — Z131 Encounter for screening for diabetes mellitus: Secondary | ICD-10-CM

## 2015-12-05 DIAGNOSIS — Z0000 Encounter for general adult medical examination without abnormal findings: Secondary | ICD-10-CM | POA: Diagnosis not present

## 2015-12-05 DIAGNOSIS — R7303 Prediabetes: Secondary | ICD-10-CM | POA: Diagnosis not present

## 2015-12-05 DIAGNOSIS — Z125 Encounter for screening for malignant neoplasm of prostate: Secondary | ICD-10-CM

## 2015-12-05 DIAGNOSIS — M25562 Pain in left knee: Secondary | ICD-10-CM

## 2015-12-05 DIAGNOSIS — Z889 Allergy status to unspecified drugs, medicaments and biological substances status: Secondary | ICD-10-CM

## 2015-12-05 LAB — LIPID PANEL
CHOL/HDL RATIO: 3
Cholesterol: 132 mg/dL (ref 0–200)
HDL: 41.5 mg/dL (ref >39.00)
LDL Cholesterol: 75 mg/dL (ref 0–99)
NONHDL: 90.11
Triglycerides: 78 mg/dL (ref 0.0–149.0)
VLDL: 15.6 mg/dL (ref 0.0–40.0)

## 2015-12-05 LAB — CBC
HEMATOCRIT: 46 % (ref 39.0–52.0)
Hemoglobin: 15 g/dL (ref 13.0–17.0)
MCHC: 32.7 g/dL (ref 30.0–36.0)
MCV: 89.4 fl (ref 78.0–100.0)
Platelets: 205 10*3/uL (ref 150.0–400.0)
RBC: 5.14 Mil/uL (ref 4.22–5.81)
RDW: 12.9 % (ref 11.5–15.5)
WBC: 5.4 10*3/uL (ref 4.0–10.5)

## 2015-12-05 LAB — COMPREHENSIVE METABOLIC PANEL
ALT: 21 U/L (ref 0–53)
AST: 21 U/L (ref 0–37)
Albumin: 4.2 g/dL (ref 3.5–5.2)
Alkaline Phosphatase: 54 U/L (ref 39–117)
BILIRUBIN TOTAL: 0.3 mg/dL (ref 0.2–1.2)
BUN: 21 mg/dL (ref 6–23)
CO2: 32 meq/L (ref 19–32)
CREATININE: 0.97 mg/dL (ref 0.40–1.50)
Calcium: 9.7 mg/dL (ref 8.4–10.5)
Chloride: 101 mEq/L (ref 96–112)
GFR: 105.51 mL/min (ref >60.00)
GLUCOSE: 100 mg/dL — AB (ref 70–99)
Potassium: 4.4 mEq/L (ref 3.5–5.1)
SODIUM: 138 meq/L (ref 135–145)
TOTAL PROTEIN: 6.7 g/dL (ref 6.0–8.3)

## 2015-12-05 LAB — HEMOGLOBIN A1C: Hgb A1c MFr Bld: 6.4 % (ref 4.6–6.5)

## 2015-12-05 LAB — PSA: PSA: 0.46 ng/mL (ref 0.10–4.00)

## 2015-12-05 MED ORDER — EPINEPHRINE 0.3 MG/0.3ML IJ SOAJ: 0.3000 mg | Freq: Once | INTRAMUSCULAR | Status: DC

## 2015-12-05 MED ORDER — TADALAFIL 20 MG PO TABS: ORAL_TABLET | ORAL | Status: DC

## 2015-12-05 NOTE — Progress Notes (Signed)
[facility] [address], [address] [place], [place] [address] [phone] [phone]  Date:  12/05/2015   Name:  Alejandro Beltran   DOB:  09/15/1966   MRN:  [id number]  PCP:  Abbe AmsterdamOPLAND,[name], MD    Chief Complaint: Annual Exam   History of Present Illness:  Alejandro Galallya H Bath is a 50 y.o. very pleasant male patient who presents with the following:  History of ED, post traumatic glaucoma.  Here today for a CPE.  Last labs 07/2014- at that time his cholesterol, etc looked fine. We do PSA testing for him periodically  He is doing well overall, he has no complaints or symptoms today  [facility].  He uses viagra with success, no CP or other concerns with this medication.  However he would like to try cialis to see how it works for him- also wonders if it might be cheaper He has a history of knee surgery so he does yoga and cycling for exercise.  He cannot run really any longer due to his knees.  He still stays fit and active  He may need a knee replacement later on in life.    He would like a flu shot and STI testing today Patient Active Problem List   Diagnosis Date Noted  . Osteoarthritis of left knee 12/22/2014  . Peanut allergy   . Acute meniscal tear of knee   . Glaucoma   . Acute medial meniscal tear   . Right eye injury 11/11/2012    Past Medical History  Diagnosis Date  . Peanut allergy   . Acute meniscal tear of knee   . Glaucoma   . Acute medial meniscal tear     left  . Allergy     Past Surgical History  Procedure Laterality Date  . Eye surgery      rt retina sx  . Meniscus repair  08/2015    Social History  Substance Use Topics  . Smoking status: Passive Smoke Exposure - Never Smoker -- 1 years    Types: Cigars  . Smokeless tobacco: Never Used  . Alcohol Use: 1.2 oz/week    2 Standard drinks or equivalent per week     Comment: socially    Family History  Problem Relation Age of Onset  .  Hypertension Mother   . Anuerysm Father   . Diabetes Maternal Grandmother   . Stroke Maternal Grandmother   . Cancer Maternal Grandfather   . Colon cancer Neg Hx     Allergies  Allergen Reactions  . Peanuts [Peanut Oil] Anaphylaxis    Exotic nuts    Medication list has been reviewed and updated.  Current Outpatient Prescriptions on File Prior to Visit  Medication Sig Dispense Refill  . brimonidine-timolol (COMBIGAN) 0.2-0.5 % ophthalmic solution Place 1 drop into both eyes every 12 (twelve) hours.    . Multiple Vitamin (MULTIVITAMIN) tablet Take 1 tablet by mouth daily.    Marland Kitchen. EPINEPHrine (EPIPEN) 0.3 mg/0.3 mL SOAJ injection Inject 0.3 mLs (0.3 mg total) into the muscle once. (Patient not taking: Reported on 11/21/2015) 2 Device 2  . VIAGRA 100 MG tablet TAKE 1/2-1 TAB BY MOUTH DAILY AS NEEDED FOR ERECTILE DYSFUNCTION.START WITH 1/2 TAB CAN TAKE WHOLE (Patient not taking: Reported on 11/21/2015) 15 tablet 11   No current facility-administered medications on file prior to visit.    Review of Systems:  As per HPI- otherwise negative.  Physical Examination: Filed Vitals:   12/05/15 1108  BP: 118/88  Pulse: 70   Filed Vitals:   12/05/15 1108  Height:  (1.702 m)  Weight: 156 lb (70.761 kg)   Body mass index is 24.43 kg/(m^2). Ideal Body Weight: Weight in (lb) to have BMI = 25: 159.3  GEN: WDWN, NAD, Non-toxic, A & O x 3, look well, blind in the right eye  HEENT: Atraumatic, Normocephalic. Neck supple. No masses, No LAD.  Bilateral TM wnl, oropharynx normal.  Left PEERL,EOMI.   Ears and Nose: No external deformity. CV: RRR, No M/G/R. No JVD. No thrill. No extra heart sounds. PULM: CTA B, no wheezes, crackles, rhonchi. No retractions. No resp. distress. No accessory muscle use. ABD: S, NT, ND. No rebound. No HSM. EXTR: No c/c/e NEURO Normal gait.  PSYCH: Normally interactive. Conversant. Not depressed or anxious appearing.  Calm demeanor.  GU: normal testes, penis and  DRE   Assessment and Plan: Physical exam  Screening for prostate cancer - Plan: PSA  Screening for deficiency anemia - Plan: CBC  Screening for diabetes mellitus - Plan: Comprehensive metabolic panel, Hemoglobin A1c  Screening for hyperlipidemia - Plan: Lipid panel  History of allergic reaction - Plan: EPINEPHrine 0.3 mg/0.3 mL IJ SOAJ injection  Knee pain, left - Plan: EPINEPHrine 0.3 mg/0.3 mL IJ SOAJ injection  Immunization due - Plan: Flu Vaccine QUAD 36+ mos IM  Other male erectile dysfunction - Plan: tadalafil (CIALIS) 20 MG tablet  Screening for STD (sexually transmitted disease) - Plan: HIV antibody, RPR, Hepatitis B surface antibody, Hepatitis B surface antigen, Hepatitis C antibody  Well man check today- he is doing great and has no concerns Refilled epipen for his peanut allergy He would like to try cialis- rx for same Will plan further follow- up pending labs. He had a colonoscopy in 2014 due to bleeding- this was negative, he can repeat in 10 years     Signed Abbe Amsterdam, MD

## 2015-12-05 NOTE — Patient Instructions (Signed)
It was great to see you today- I will be in touch with your labs asap You got your flu shot today Try the cialis- if you prefer viagra just let me know and I can change it for you You had a colonoscopy in 2014- you can wait to have your next one in 2024 which is great news.  cologuard is also an option for you

## 2015-12-05 NOTE — Addendum Note (Signed)
Addended by: Abbe AmsterdamOPLAND, Etherine Mackowiak C on: 12/05/2015 07:18 PM   Modules accepted: Orders

## 2015-12-06 LAB — SYPHILIS: RPR W/REFLEX TO RPR TITER AND TREPONEMAL ANTIBODIES, TRADITIONAL SCREENING AND DIAGNOSIS ALGORITHM

## 2015-12-06 LAB — HEPATITIS B SURFACE ANTIGEN: Hepatitis B Surface Ag: NEGATIVE

## 2015-12-06 LAB — HEPATITIS C ANTIBODY: HCV Ab: NEGATIVE

## 2015-12-06 LAB — HEPATITIS B SURFACE ANTIBODY, QUANTITATIVE: Hep B S AB Quant (Post): 0 m[IU]/mL

## 2015-12-06 LAB — HIV ANTIBODY (ROUTINE TESTING W REFLEX): HIV 1&2 Ab, 4th Generation: NONREACTIVE

## 2015-12-11 ENCOUNTER — Other Ambulatory Visit: Payer: Self-pay

## 2015-12-11 DIAGNOSIS — N528 Other male erectile dysfunction: Secondary | ICD-10-CM

## 2015-12-11 MED ORDER — TADALAFIL 20 MG PO TABS: ORAL_TABLET | ORAL | Status: DC

## 2015-12-12 ENCOUNTER — Telehealth: Payer: Self-pay

## 2015-12-12 ENCOUNTER — Other Ambulatory Visit: Payer: Self-pay | Admitting: Family Medicine

## 2015-12-12 DIAGNOSIS — N528 Other male erectile dysfunction: Secondary | ICD-10-CM

## 2015-12-12 MED ORDER — TADALAFIL 20 MG PO TABS: ORAL_TABLET | ORAL | Status: DC

## 2015-12-12 NOTE — Telephone Encounter (Signed)
Informed patient of lab results.  Advised him he is not immune to Hepatitis B and the vaccine is recommended.  Patient understands and appointment made.

## 2015-12-17 ENCOUNTER — Telehealth: Payer: Self-pay | Admitting: Family Medicine

## 2015-12-17 ENCOUNTER — Telehealth: Payer: Self-pay

## 2015-12-17 DIAGNOSIS — N528 Other male erectile dysfunction: Secondary | ICD-10-CM

## 2015-12-17 MED ORDER — SILDENAFIL CITRATE 100 MG PO TABS: ORAL_TABLET | ORAL | Status: DC

## 2015-12-17 NOTE — Telephone Encounter (Signed)
DC cialis and used viagra instead due to insurance preference

## 2015-12-17 NOTE — Telephone Encounter (Signed)
Called patient to inform Viagra is preferred alternative to Cialis. Patient agreed ok to Rx Viagra.

## 2015-12-18 ENCOUNTER — Ambulatory Visit (INDEPENDENT_AMBULATORY_CARE_PROVIDER_SITE_OTHER): Admitting: Family Medicine

## 2015-12-18 ENCOUNTER — Ambulatory Visit

## 2015-12-18 VITALS — BP 111/74 | HR 61 | Temp 98.0°F | Resp 16 | Ht 67.0 in | Wt 157.0 lb

## 2015-12-18 DIAGNOSIS — H109 Unspecified conjunctivitis: Secondary | ICD-10-CM

## 2015-12-18 MED ORDER — TOBRAMYCIN 0.3 % OP SOLN: 1.0000 [drp] | OPHTHALMIC | Status: DC
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
## 2015-12-18 NOTE — Patient Instructions (Addendum)

## 2016-02-01 ENCOUNTER — Telehealth: Payer: Self-pay | Admitting: Family Medicine

## 2016-02-01 DIAGNOSIS — F4310 Post-traumatic stress disorder, unspecified: Secondary | ICD-10-CM

## 2016-02-01 NOTE — Telephone Encounter (Signed)
Relation to pt: self Call back number: fax [phone]    Reason for call:  Patient requesting a referral to Dr. Lauralyn Primesarl Carter  [address] [place] (suite [phone]) [phone]. Patient states due to veterans counseling for ptsd

## 2016-02-05 ENCOUNTER — Ambulatory Visit: Admitting: Internal Medicine

## 2016-02-05 ENCOUNTER — Telehealth: Payer: Self-pay | Admitting: Family Medicine

## 2016-02-05 NOTE — Telephone Encounter (Signed)
Patient had scheduled appointment with Dr. Drue NovelPaz at 2:15. Arrived at 2:31. Informed of the 10 minute policy. Skyped Kaylyn to ask if he could still be seen. Informed by CMA that he could not and that he needed to see Dr. Patsy Lageropland because of B12. Informed patient who was not pleased. Did not wish to reschedule.

## 2016-02-12 NOTE — Telephone Encounter (Signed)
Charge or no charge? °

## 2016-02-12 NOTE — Telephone Encounter (Signed)
No Charge 

## 2016-03-03 ENCOUNTER — Telehealth: Payer: Self-pay | Admitting: Family Medicine

## 2016-03-03 DIAGNOSIS — R400 Somnolence: Secondary | ICD-10-CM

## 2016-03-03 NOTE — Telephone Encounter (Signed)
Please advise 

## 2016-03-03 NOTE — Telephone Encounter (Signed)
Pt called in to request a referral to a sleep study location. Pt has tricare so he says that the referral has to be sent to insurance first then to the referral location. Pt says that the sleep study provided the following information to give to PCP office.    [facility] - [place], [place] -- (Codes: [id number] also [phone])    CB: [phone]- if any questions.

## 2016-03-21 ENCOUNTER — Ambulatory Visit: Admitting: Internal Medicine

## 2016-03-21 ENCOUNTER — Ambulatory Visit (INDEPENDENT_AMBULATORY_CARE_PROVIDER_SITE_OTHER): Admitting: Internal Medicine

## 2016-03-21 ENCOUNTER — Encounter: Payer: Self-pay | Admitting: Internal Medicine

## 2016-03-21 VITALS — BP 108/76 | HR 56 | Temp 98.2°F | Ht 67.0 in | Wt 159.1 lb

## 2016-03-21 DIAGNOSIS — R739 Hyperglycemia, unspecified: Secondary | ICD-10-CM

## 2016-03-21 DIAGNOSIS — R5383 Other fatigue: Secondary | ICD-10-CM

## 2016-03-21 LAB — HEMOGLOBIN A1C
Hgb A1c MFr Bld: 5.7 % — ABNORMAL HIGH (ref <5.7)
Mean Plasma Glucose: 117 mg/dL

## 2016-03-21 LAB — FOLATE: Folate: >24 ng/mL (ref >5.4)

## 2016-03-21 LAB — VITAMIN B12: Vitamin B-12: 896 pg/mL (ref 200–1100)

## 2016-03-21 NOTE — Progress Notes (Signed)
Subjective:    Patient ID: Alejandro Beltran, male    DOB: 1966/07/20, 50 y.o.   MRN: [id number]  DOS:  03/21/2016 Type of visit - description : Acute visit Interval history: One-month history of fatigue described as simply lack of energy, feeling tired. He is needing to take naps during the afternoons. Denies taking any new medications. No recent changes in his weight    Review of Systems No chest pain difficulty breathing or lower extremity edema No dyspnea on exertion No nausea or vomiting. No blood in the stools. Stress levels at baseline. Slightly decreased libido?  Past Medical History  Diagnosis Date  . Peanut allergy   . Acute meniscal tear of knee   . Glaucoma   . Acute medial meniscal tear     left  . Allergy     Past Surgical History  Procedure Laterality Date  . Eye surgery      rt retina sx  . Meniscus repair  08/2015    Social History   Social History  . Marital Status: Married    Spouse Name: N/A  . Number of Children: N/A  . 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 Smoking status: Passive Smoke Exposure - Never Smoker -- 1 years    Types: Cigars  . Smokeless tobacco: Never Used  . Alcohol Use: 1.2 oz/week    2 Standard drinks or equivalent per week     Comment: socially  . Drug Use: No  . Sexual Activity:    Partners: Female    Pharmacist, hospitalBirth Control/ Protection: Condom   Other Topics Concern  . Not on file   Social History Narrative        Medication List       This list is accurate as of: 03/21/16  9:30 PM.  Always use your most recent med list.               COMBIGAN 0.2-0.5 % ophthalmic solution  Generic drug:  brimonidine-timolol  Place 1 drop into both eyes every 12 (twelve) hours.     EPINEPHrine 0.3 mg/0.3 mL Soaj injection  Commonly known as:  EPI-PEN  Inject 0.3 mLs (0.3 mg total) into the muscle once.     multivitamin tablet  Take 1 tablet by mouth daily.       sildenafil 100 MG tablet  Commonly known as:  VIAGRA  Take a 1/2 or whole tablet daily as needed for ED           Objective:   Physical Exam BP 108/76 mmHg  Pulse 56  Temp(Src) 98.2 F (36.8 C) (Oral)  Ht 5\' 7"  (1.702 m)  Wt 159 lb 2 oz (72.179 kg)  BMI 24.92 kg/m2  SpO2 98% General:   Well developed, well nourished . NAD.  HEENT:  Normocephalic . Face symmetric, atraumatic. Neck: No thyromegaly Lungs:  CTA B Normal respiratory effort, no intercostal retractions, no accessory muscle use. Heart: RRR,  no murmur.  no pretibial edema bilaterally  Abdomen:  Not distended, soft, non-tender. No rebound or rigidity.  Skin: Not pale. Not jaundice Neurologic:  alert & oriented X3.  Speech normal, gait appropriate for age and unassisted Psych--  Cognition and judgment appear intact.  Cooperative with normal attention span and concentration.  Behavior appropriate. No anxious or depressed appearing.    Assessment & Plan:   Fatigue: Fatigue as described above, the patient reports  is going to do a sleep study next week, OSA certainly could account for his symptoms although he does not have the typical phenotype. Labs from 3 months ago reviewed , all very good except for the A1C To  be complete we will get and B12, folic acid Prediabetes:  A1c concept discussed with the patient, repeat A1c Follow-up PCP 1 month

## 2016-03-21 NOTE — Progress Notes (Signed)
Pre visit review using our clinic review tool, if applicable. No additional management support is needed unless otherwise documented below in the visit note. 

## 2016-03-21 NOTE — Patient Instructions (Signed)
GO TO THE LAB : Get the blood work     GO TO THE FRONT DESK Schedule your next appointment for a  checkup with your primary doctor in one month

## 2016-03-27 ENCOUNTER — Encounter: Payer: Self-pay | Admitting: Family

## 2016-04-02 ENCOUNTER — Encounter: Payer: Self-pay | Admitting: Family Medicine

## 2016-04-02 DIAGNOSIS — R0683 Snoring: Secondary | ICD-10-CM | POA: Insufficient documentation

## 2016-06-23 ENCOUNTER — Ambulatory Visit

## 2016-06-23 ENCOUNTER — Encounter: Payer: Self-pay | Admitting: Family Medicine

## 2016-06-23 ENCOUNTER — Ambulatory Visit (HOSPITAL_BASED_OUTPATIENT_CLINIC_OR_DEPARTMENT_OTHER)
Admission: RE | Admit: 2016-06-23 | Discharge: 2016-06-23 | Disposition: A | Discharge status: Home / Self Care | Source: Ambulatory Visit | Attending: Family Medicine | Admitting: Family Medicine

## 2016-06-23 ENCOUNTER — Ambulatory Visit (INDEPENDENT_AMBULATORY_CARE_PROVIDER_SITE_OTHER): Admitting: Family Medicine

## 2016-06-23 VITALS — BP 118/78 | HR 57 | Temp 98.3°F | Ht 66.0 in | Wt 164.6 lb

## 2016-06-23 DIAGNOSIS — S62524A Nondisplaced fracture of distal phalanx of right thumb, initial encounter for closed fracture: Secondary | ICD-10-CM | POA: Diagnosis not present

## 2016-06-23 DIAGNOSIS — Z23 Encounter for immunization: Secondary | ICD-10-CM

## 2016-06-23 DIAGNOSIS — S6991XA Unspecified injury of right wrist, hand and finger(s), initial encounter: Secondary | ICD-10-CM | POA: Diagnosis not present

## 2016-06-23 DIAGNOSIS — X58XXXA Exposure to other specified factors, initial encounter: Secondary | ICD-10-CM | POA: Insufficient documentation

## 2016-06-23 NOTE — Progress Notes (Signed)
Musculoskeletal Exam  Patient: Alejandro Beltran DOB: Nov 14, 1965  DOS: 06/23/2016  SUBJECTIVE:  Chief Complaint:   Chief Complaint  Patient presents with  . Wrist Pain    Pt reports falling down the stairs and landing on his wrist/ Pt reports using ibuprofen with mild relief and also some cream to the area (does not know what kind) with temporary relief     Alejandro Galallya H Buch is a 50 y.o.  male for evaluation and treatment of R wrist pain.   Onset:  1 day ago.  sudden, fell down stairs, thinks he may have landed on his arm.  Location: wrist, thumb, middle finger Character:  aching  Progression of issue:  is unchanged Associated symptoms: Swelling and bruising Treatment: to date has been rest, ice and OTC NSAIDS.  Neurovascular symptoms: no  ROS: Musculoskeletal/Extremities: +R wrist and thumb and middle finger pain Neurologic: no numbness, no tingling no weakness   Past Medical History:  Diagnosis Date  . Acute medial meniscal tear    left  . Acute meniscal tear of knee   . Allergy   . Glaucoma   . Peanut allergy    Past Surgical History:  Procedure Laterality Date  . EYE SURGERY     rt retina sx  . MENISCUS REPAIR  08/2015   Family History  Problem Relation Age of Onset  . Hypertension Mother   . Anuerysm Father   . Diabetes Maternal Grandmother   . Stroke Maternal Grandmother   . Cancer Maternal Grandfather   . Colon cancer Neg Hx    Current Outpatient Prescriptions  Medication Sig Dispense Refill  . brimonidine-timolol (COMBIGAN) 0.2-0.5 % ophthalmic solution Place 1 drop into both eyes every 12 (twelve) hours.    Marland Kitchen. EPINEPHrine 0.3 mg/0.3 mL IJ SOAJ injection Inject 0.3 mLs (0.3 mg total) into the muscle once. 2 Device 2  . Multiple Vitamin (MULTIVITAMIN) tablet Take 1 tablet by mouth daily.    . sildenafil (VIAGRA) 100 MG tablet Take a 1/2 or whole tablet daily as needed for ED 18 tablet 3   Allergies  Allergen Reactions  . Peanuts [Peanut Oil] Anaphylaxis     Exotic nuts   Social History   Social History  . 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 Smoking status: Passive Smoke Exposure - Never Smoker    Years: 1.00    Types: Cigars  . Smokeless tobacco: Never Used  . Alcohol use 1.2 oz/week    2 Standard drinks or equivalent per week     Comment: socially  . Drug use: No  . Sexual activity: Yes    Partners: Female    Birth control/ protection: Condom   Objective:  VITAL SIGNS: BP 118/78 (BP Location: Right Arm, Patient Position: Sitting, Cuff Size: Large)   Pulse (!) 57   Temp 98.3 F (36.8 C) (Oral)   Ht 5\' 6"  (1.676 m)   Wt 164 lb 9.6 oz (74.7 kg)   SpO2 99%   BMI 26.57 kg/m  Constitutional: Well formed, well developed. No acute distress. HENT: Normocephalic, atraumatic.  Moist mucous membranes.  Eyes:  EOM grossly intact. Conjunctiva normal.  Neck:  Full range of motion.   Cardiovascular: Brisk cap refill Thorax & Lungs:  Nml resp effort Extremities: No clubbing. No cyanosis. No edema.  Skin: Warm. Dry. No erythema. No rash. +subungual hematoma of thumb medially; there is ecchymosis of the medial  meat of palmar side of thumb.  Musculoskeletal: R wrist.   Normal active range of motion: no.  Decreased flexion and extension Normal passive range of motion: no. Decreased flexion and extension Tenderness to palpation: yes-lateral portion of first digit, dorsal prox phalanx of 3rd digit; +snuffbox tenderness and over the dorsal wrist in proximity to lunate and triquetrum. Deformity: no Ecchymosis: yes Neurologic: Normal sensory function. No focal deficits noted. No clonus. Psychiatric: Normal mood. Age appropriate judgment and insight. Alert & oriented x 3.    I applied an aluminum toad finger splint on his thumb to protect that area.   Assessment:  Right wrist injury, initial encounter - Plan: DG Wrist Complete Right, DG Hand Complete  Right, PR APPLY FINGER SPLINT,DYNAMIC  Closed nondisplaced fracture of distal phalanx of right thumb, initial encounter - Plan: PR APPLY FINGER SPLINT,DYNAMIC, PR FINGER SPLINT, STATIC  Encounter for immunization - Plan: HM COLONOSCOPY, DG Wrist Complete Right, DG Hand Complete Right, Flu Vaccine QUAD 36+ mos IM  Plan: Orders as above. XR unofficially shows no carpal fx or dislocation. I do appreciate a non-displaced volar distal first phalanx fracture. He was placed in a frog splint for protection. I do not believe his subungual hematoma represents an open fracture given the location of the fx on XR. F/u in 1 week if symptoms worsen or fail to improve with the wrist. He has a thumb spica splint at home that he will use. Ice, Motrin and Tylenol for pain. Offered stronger pain medicine for the next few days, but the patient declined. F/u in 2 weeks for the thumb fx. The patient voiced understanding and agreement to the plan.   Jilda Roche Arnot, DO 06/23/16  5:13 PM

## 2016-06-23 NOTE — Progress Notes (Signed)
Pre visit review using our clinic review tool, if applicable. No additional management support is needed unless otherwise documented below in the visit note. 

## 2016-06-27 ENCOUNTER — Telehealth: Payer: Self-pay | Admitting: Family Medicine

## 2016-06-27 NOTE — Telephone Encounter (Signed)
Patient was given a monkey brace for his broken thumb. He has lost it. He thinks maybe he took it off in the bathroom to wash his hands and left it. He would like to know if he could get another one?   Patient phone: [phone]

## 2016-06-30 ENCOUNTER — Ambulatory Visit: Admitting: Family Medicine

## 2016-06-30 DIAGNOSIS — Z0289 Encounter for other administrative examinations: Secondary | ICD-10-CM

## 2016-07-02 ENCOUNTER — Encounter: Payer: Self-pay | Admitting: Family Medicine

## 2016-07-02 NOTE — Telephone Encounter (Signed)
Tried to reach the pt by phone but unable to do so.   Pt has an VM that has not been set-up.//AB/CMA

## 2016-07-03 NOTE — Telephone Encounter (Signed)
Tried again to reach the pt by phone regarding brace for his hand,but was not able to do so.  No VM set up.//AB/CMA

## 2016-07-04 NOTE — Telephone Encounter (Signed)
Patient returned your call and stated that he no longer needs a new brace. He found his.

## 2016-07-07 ENCOUNTER — Ambulatory Visit: Admitting: Family Medicine

## 2016-07-07 DIAGNOSIS — Z0289 Encounter for other administrative examinations: Secondary | ICD-10-CM

## 2016-07-08 ENCOUNTER — Encounter: Payer: Self-pay | Admitting: Family Medicine

## 2016-07-10 ENCOUNTER — Ambulatory Visit (HOSPITAL_BASED_OUTPATIENT_CLINIC_OR_DEPARTMENT_OTHER)
Admission: RE | Admit: 2016-07-10 | Discharge: 2016-07-10 | Disposition: A | Discharge status: Home / Self Care | Source: Ambulatory Visit | Attending: Family Medicine | Admitting: Family Medicine

## 2016-07-10 ENCOUNTER — Ambulatory Visit: Admitting: Family Medicine

## 2016-07-10 ENCOUNTER — Other Ambulatory Visit: Payer: Self-pay | Admitting: Family Medicine

## 2016-07-10 DIAGNOSIS — M25531 Pain in right wrist: Secondary | ICD-10-CM

## 2016-07-10 DIAGNOSIS — S62521A Displaced fracture of distal phalanx of right thumb, initial encounter for closed fracture: Secondary | ICD-10-CM

## 2016-07-10 DIAGNOSIS — Z0289 Encounter for other administrative examinations: Secondary | ICD-10-CM

## 2016-07-11 ENCOUNTER — Ambulatory Visit (INDEPENDENT_AMBULATORY_CARE_PROVIDER_SITE_OTHER): Admitting: Family Medicine

## 2016-07-11 ENCOUNTER — Encounter: Payer: Self-pay | Admitting: Family Medicine

## 2016-07-11 VITALS — BP 112/80 | HR 57 | Temp 98.0°F | Ht 68.0 in | Wt 161.2 lb

## 2016-07-11 DIAGNOSIS — S62521D Displaced fracture of distal phalanx of right thumb, subsequent encounter for fracture with routine healing: Secondary | ICD-10-CM

## 2016-07-11 DIAGNOSIS — S6991XD Unspecified injury of right wrist, hand and finger(s), subsequent encounter: Secondary | ICD-10-CM

## 2016-07-11 DIAGNOSIS — M25531 Pain in right wrist: Secondary | ICD-10-CM

## 2016-07-11 DIAGNOSIS — S62521A Displaced fracture of distal phalanx of right thumb, initial encounter for closed fracture: Secondary | ICD-10-CM

## 2016-07-11 NOTE — Progress Notes (Signed)
Pre visit review using our clinic review tool, if applicable. No additional management support is needed unless otherwise documented below in the visit note. 

## 2016-07-11 NOTE — Progress Notes (Signed)
Chief Complaint  Patient presents with  . Wrist Pain    Pt reports his middle finger is still hurting/ Pt has XRay yesterday and wants to review results    Subjective: Patient is a 50 y.o. male here for F/u for his wrist and thumb.  R thumb fx Tuft fx 2 weeks ago after sustaining a fall. He has been using an aluminum toad splint for protection and support. His thumb is feeling better.  R wrist Wrist is still painful, feels better when he wears his splint. No swelling, new numbness or tingling. Initial and f/u XR's were unremarkable.   R middle finger Still hurting since the fall, close to knuckle. No numbness or tingling, no swelling or bruising. XR's have been unremarkable of this region as well.   ROS: MSK: As noted above  Family History  Problem Relation Age of Onset  . Hypertension Mother   . Anuerysm Father   . Diabetes Maternal Grandmother   . Stroke Maternal Grandmother   . Cancer Maternal Grandfather   . Colon cancer Neg Hx    Past Medical History:  Diagnosis Date  . Acute medial meniscal tear    left  . Acute meniscal tear of knee   . Allergy   . Glaucoma   . Peanut allergy    Allergies  Allergen Reactions  . Peanuts [Peanut Oil] Anaphylaxis    Exotic nuts    Current Outpatient Prescriptions:  .  brimonidine-timolol (COMBIGAN) 0.2-0.5 % ophthalmic solution, Place 1 drop into both eyes every 12 (twelve) hours., Disp: , Rfl:  .  EPINEPHrine 0.3 mg/0.3 mL IJ SOAJ injection, Inject 0.3 mLs (0.3 mg total) into the muscle once., Disp: 2 Device, Rfl: 2 .  Multiple Vitamin (MULTIVITAMIN) tablet, Take 1 tablet by mouth daily., Disp: , Rfl:  .  sildenafil (VIAGRA) 100 MG tablet, Take a 1/2 or whole tablet daily as needed for ED, Disp: 18 tablet, Rfl: 3  Objective: BP 112/80 (BP Location: Left Arm, Patient Position: Sitting, Cuff Size: Small)   Pulse (!) 57   Temp 98 F (36.7 C) (Oral)   Ht 5\' 8"  (1.727 m)   Wt 161 lb 3.2 oz (73.1 kg)   SpO2 98%   BMI 24.51  kg/m  General: Awake, appears stated age Heart: Brisk cap refil Lungs: No accessory muscle use Neuro: Sensation intact to light touch on R hand  MSK: +R snuff box tenderness, +TTP over the extensor tendon on 3rd R digit. +Pain with MCP extension of R 3rd digit. No swelling over thumb, mild TTP. Psych: Age appropriate judgment and insight, normal affect and mood  Assessment and Plan: Wrist pain, acute, right - Plan: MR WRIST RIGHT WO CONTRAST  Finger injury, right, subsequent encounter  Closed fracture of tuft of distal phalanx of right thumb - Plan: DG Finger Thumb Right  Orders as above. Will officially r/o scaphoid fx as he is still significantly tender over the snuff box. Recommended buddy taping his finger as it is likely a sprain/jam. Avoid aggravating motions. Continue conservative management for nondisplaced fx that is clinically improving. Continue splint for another 2 weeks.  F/u in 2 weeks, XR before the visit. The patient voiced understanding and agreement to the plan.  Jilda Rocheicholas Paul FargoWendling, DO 07/11/16  11:24 AM

## 2016-07-14 ENCOUNTER — Other Ambulatory Visit (HOSPITAL_BASED_OUTPATIENT_CLINIC_OR_DEPARTMENT_OTHER)

## 2016-07-14 ENCOUNTER — Other Ambulatory Visit: Payer: Self-pay | Admitting: Family Medicine

## 2016-07-14 ENCOUNTER — Ambulatory Visit (INDEPENDENT_AMBULATORY_CARE_PROVIDER_SITE_OTHER)

## 2016-07-14 ENCOUNTER — Ambulatory Visit

## 2016-07-14 DIAGNOSIS — M25531 Pain in right wrist: Secondary | ICD-10-CM

## 2016-07-14 DIAGNOSIS — T148XXD Other injury of unspecified body region, subsequent encounter: Secondary | ICD-10-CM | POA: Diagnosis not present

## 2016-07-14 DIAGNOSIS — M25431 Effusion, right wrist: Secondary | ICD-10-CM | POA: Diagnosis not present

## 2016-07-14 DIAGNOSIS — W19XXXD Unspecified fall, subsequent encounter: Secondary | ICD-10-CM | POA: Diagnosis not present

## 2016-07-14 DIAGNOSIS — M778 Other enthesopathies, not elsewhere classified: Secondary | ICD-10-CM

## 2016-07-14 DIAGNOSIS — S62521A Displaced fracture of distal phalanx of right thumb, initial encounter for closed fracture: Secondary | ICD-10-CM

## 2016-07-15 ENCOUNTER — Telehealth: Payer: Self-pay | Admitting: Family Medicine

## 2016-07-15 NOTE — Telephone Encounter (Signed)
Pt called in. He says that he is returning a call. Pt says that no voicemail was left.

## 2016-07-31 ENCOUNTER — Encounter: Payer: Self-pay | Admitting: Family Medicine

## 2016-07-31 ENCOUNTER — Ambulatory Visit (INDEPENDENT_AMBULATORY_CARE_PROVIDER_SITE_OTHER): Admitting: Family Medicine

## 2016-07-31 VITALS — BP 137/83 | HR 66 | Temp 98.6°F | Wt 165.8 lb

## 2016-07-31 DIAGNOSIS — S62521D Displaced fracture of distal phalanx of right thumb, subsequent encounter for fracture with routine healing: Secondary | ICD-10-CM

## 2016-07-31 DIAGNOSIS — S63501D Unspecified sprain of right wrist, subsequent encounter: Secondary | ICD-10-CM | POA: Diagnosis not present

## 2016-07-31 DIAGNOSIS — S62521A Displaced fracture of distal phalanx of right thumb, initial encounter for closed fracture: Secondary | ICD-10-CM

## 2016-07-31 NOTE — Progress Notes (Signed)
Pre visit review using our clinic review tool, if applicable. No additional management support is needed unless otherwise documented below in the visit note. 

## 2016-07-31 NOTE — Progress Notes (Signed)
[facility] [address], [address] [place], [place] [id number] [phone] [phone]  Date:  07/31/2016   Name:  Alejandro Beltran   DOB:  04/12/1966   MRN:  [id number]  PCP:  Abbe AmsterdamOPLAND,[name], MD    Chief Complaint: Follow-up (Pt here for f/u visit for wrist pain. Pt states that his finger is still bothering him. )   History of Present Illness:  Alejandro Beltran is a 50 y.o. very pleasant male patient who presents with the following:  Seen here last month with a wrist/ hand injury-  He was going down some steps and slipped on carpet- he broke his fall with his right wrist.  He fell on 10/1 and was seen on 10/2- originally found to have a tuft fracture of the distal phalanx of the thumb, but as time went by he developed more sx in his wrist  He also eventually had an MRI of his wrist - see below The tuft fracture of the distal right thumb is better- not painful at this time Currently his wrist feels ok when he has his splint on, but when he removes the splint he will have more pain.  He also notes that his 2nd and 3rd MCP joints are swollen and stiff, and he cannot use the hand as well as he normally can He is wearing an OTC right wrist soft splint and has buddy taped the long and index fingers.  He is generally in good health and is feeling well otherwise  He is right handed Dg Wrist Complete Right  Result Date: 07/10/2016 CLINICAL DATA:  Patient fell downstairs in the beginning of October. Pain. EXAM: RIGHT WRIST - COMPLETE 3+ VIEW COMPARISON:  Been an right hand radiographs 06/23/2016 FINDINGS: There is no evidence of fracture or dislocation. There is no evidence of arthropathy or other focal bone abnormality. Soft tissues are unremarkable. Carpal bones appear aligned. Scaphoid appears intact. IMPRESSION: Negative. Electronically Signed   By: Tollie Ethavid  Kwon M.D.   On: 07/10/2016 18:30   Mr Wrist Right Wo Contrast  Result Date:  07/14/2016 CLINICAL DATA:  Status post fall.  Right wrist pain. EXAM: MR OF THE RIGHT WRIST WITHOUT CONTRAST TECHNIQUE: Multiplanar, multisequence MR imaging of the right wrist was performed. No intravenous contrast was administered. COMPARISON:  None. FINDINGS: Ligaments: Increased signal in the scapholunate ligament likely reflecting mild ligament strain without disruption. Intact lunotriquetral ligament. Triangular fibrocartilage: Intact TFCC. Tendons: Intact flexor compartment tendons. Mild tendinosis of the extensor carpi ulnaris with a short-segment split tear Carpal tunnel/median nerve: Normal carpal tunnel. Normal median nerve. Guyon's canal: Normal. Joint/cartilage: Small radiocarpal joint effusion with mild synovitis. Mild osteoarthritis of the first carpometacarpal joint. Bones/carpal alignment: Mild marrow edema in the dorsal aspect of the hamate likely reflecting osseous contusion. Normal alignment. No aggressive osseous lesion. Other: No fluid collection or hematoma. Mild edema in the pronator quadratus muscle likely reflecting mild muscle strain. IMPRESSION: 1. No acute osseous injury of the right wrist. 2. Mild edema in the pronator quadratus muscle likely reflecting mild muscle strain. 3. Increased signal in the scapholunate ligament likely reflecting mild ligament strain without disruption. 4. Mild tendinosis of the extensor carpi ulnaris with a short-segment split tear 5. Mild osseous contusion of the dorsal aspect of the hamate Electronically Signed   By: Elige KoHetal  Patel   On: 07/14/2016 15:51   Dg Hand Complete Right  Result Date: 07/10/2016 CLINICAL DATA:  Patient fell in early  October. Right thumb tuft fracture. ) EXAM: RIGHT HAND - COMPLETE 3+ VIEW COMPARISON:  06/23/2016 FINDINGS: Unchanged appearing minimally displaced closed comminuted tuft fracture of the thumb. No new fracture lucencies, bone destruction or malalignment is identified. The carpal bones and distal radius as well as ulna  appear unremarkable. IMPRESSION: Known tuft fracture without significant change in alignment. Electronically Signed   By: Tollie Eth M.D.   On: 07/10/2016 18:32     Patient Active Problem List   Diagnosis Date Noted  . Snoring 04/02/2016  . Osteoarthritis of left knee 12/22/2014  . Peanut allergy   . Acute meniscal tear of knee   . Glaucoma   . Acute medial meniscal tear   . Right eye injury 11/11/2012    Past Medical History:  Diagnosis Date  . Acute medial meniscal tear    left  . Acute meniscal tear of knee   . Allergy   . Glaucoma   . Peanut allergy     Past Surgical History:  Procedure Laterality Date  . EYE SURGERY     rt retina sx  . MENISCUS REPAIR  08/2015    Social History  Substance Use Topics  . Smoking status: Passive Smoke Exposure - Never Smoker    Years: 1.00    Types: Cigars  . Smokeless tobacco: Never Used  . Alcohol use 1.2 oz/week    2 Standard drinks or equivalent per week     Comment: socially    Family History  Problem Relation Age of Onset  . Hypertension Mother   . Anuerysm Father   . Diabetes Maternal Grandmother   . Stroke Maternal Grandmother   . Cancer Maternal Grandfather   . Colon cancer Neg Hx     Allergies  Allergen Reactions  . Peanuts [Peanut Oil] Anaphylaxis    Exotic nuts    Medication list has been reviewed and updated.  Current Outpatient Prescriptions on File Prior to Visit  Medication Sig Dispense Refill  . brimonidine-timolol (COMBIGAN) 0.2-0.5 % ophthalmic solution Place 1 drop into both eyes every 12 (twelve) hours.    Marland Kitchen EPINEPHrine 0.3 mg/0.3 mL IJ SOAJ injection Inject 0.3 mLs (0.3 mg total) into the muscle once. 2 Device 2  . Multiple Vitamin (MULTIVITAMIN) tablet Take 1 tablet by mouth daily.    . sildenafil (VIAGRA) 100 MG tablet Take a 1/2 or whole tablet daily as needed for ED 18 tablet 3   No current facility-administered medications on file prior to visit.     Review of Systems:  As per HPI-  otherwise negative.   Physical Examination: Vitals:   07/31/16 1355  BP: 137/83  Pulse: 66  Temp: 98.6 F (37 C)   Vitals:   07/31/16 1355  Weight: 165 lb 12.8 oz (75.2 kg)   Body mass index is 25.21 kg/m. Ideal Body Weight:    GEN: WDWN, NAD, Non-toxic, A & O x 3, looks well, history of chronic right eye injury HEENT: Atraumatic, Normocephalic. Neck supple. No masses, No LAD. Ears and Nose: No external deformity. CV: RRR, No M/G/R. No JVD. No thrill. No extra heart sounds. PULM: CTA B, no wheezes, crackles, rhonchi. No retractions. No resp. distress. No accessory muscle use. EXTR: No c/c/e NEURO Normal gait.  PSYCH: Normally interactive. Conversant. Not depressed or anxious appearing.  Calm demeanor.  Right wrist:  He has mild swelling/ thickening of the 2nd and 3rd MCP.  He has some weakness to flexion and extension of the long and index  fingers.  The area over the tuft fracture is not tender.  He has pain with flexion of the wrist    Assessment and Plan: Sprain of right wrist, subsequent encounter - Plan: Ambulatory referral to Hand Surgery  Closed fracture of tuft of distal phalanx of right thumb  Here today following a wrist and hand injury that occurred 5 weeks ago.  He is still having sx and is getting concerned.  Advised him that I think this injury will continue to improve, but at this time will refer him to hand surgery for an opinion.  He may also possibly benefit from hand therapy.  He states understanding   Signed Abbe AmsterdamJessica Copland, MD

## 2016-07-31 NOTE — Patient Instructions (Signed)
It was good to see you today- I will get you seen by hand asap. I requested Dr. Janee Mornhompson unless someone else can see you sooner

## 2016-09-25 ENCOUNTER — Ambulatory Visit (HOSPITAL_BASED_OUTPATIENT_CLINIC_OR_DEPARTMENT_OTHER)

## 2016-10-13 ENCOUNTER — Other Ambulatory Visit: Payer: Self-pay | Admitting: Family Medicine

## 2016-10-13 DIAGNOSIS — N528 Other male erectile dysfunction: Secondary | ICD-10-CM

## 2016-10-13 MED ORDER — SILDENAFIL CITRATE 100 MG PO TABS: ORAL_TABLET | ORAL | 3 refills | Status: DC

## 2016-10-13 NOTE — Telephone Encounter (Signed)
Relation to ZO:XWRUpt:self Call back number:[phone] Pharmacy: [facility] [place] [address] [phone] (Phone) [phone] (Fax)     Reason for call:  Patient requesting a refill sildenafil (VIAGRA) 100 MG tablet

## 2016-12-01 ENCOUNTER — Encounter (HOSPITAL_COMMUNITY): Payer: Self-pay | Admitting: Emergency Medicine

## 2016-12-01 ENCOUNTER — Ambulatory Visit (HOSPITAL_COMMUNITY)
Admission: EM | Admit: 2016-12-01 | Discharge: 2016-12-01 | Disposition: A | Discharge status: Home / Self Care | Attending: Emergency Medicine | Admitting: Emergency Medicine

## 2016-12-01 DIAGNOSIS — S161XXA Strain of muscle, fascia and tendon at neck level, initial encounter: Secondary | ICD-10-CM

## 2016-12-01 MED ORDER — TRAMADOL HCL 50 MG PO TABS: 50.0000 mg | ORAL_TABLET | Freq: Four times a day (QID) | ORAL | 0 refills | Status: DC | PRN

## 2016-12-01 MED ORDER — METAXALONE 800 MG PO TABS: 800.0000 mg | ORAL_TABLET | Freq: Three times a day (TID) | ORAL | 0 refills | Status: DC

## 2016-12-01 MED ORDER — DICLOFENAC SODIUM 75 MG PO TBEC: 75.0000 mg | DELAYED_RELEASE_TABLET | Freq: Two times a day (BID) | ORAL | 0 refills | Status: DC

## 2016-12-01 NOTE — Discharge Instructions (Signed)
People tend to feel worse over the next several days, but most people are back to normal in 1 week. A small number of people will have persistent pain for up to six weeks. Take the diclofenac on a regular basis as directed. You may take up to 1 gram of tylenol 4 times a day. This with the NSAID is an extremely effective combination for pain.    Some people may require physical therapy. Early range of motion neck exercises has been shown to speed recovery. Start doing them as soon as possible. Start doing small range and amplitude movements of your neck, first in one direction, then the other. Repeat this 10 times in each direction every hour while awake. Do these to the maximum comfortable range. You may do this sitting up or lying down.
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
## 2017-01-05 ENCOUNTER — Ambulatory Visit (INDEPENDENT_AMBULATORY_CARE_PROVIDER_SITE_OTHER): Payer: Self-pay | Admitting: Family Medicine

## 2017-01-05 VITALS — BP 134/82 | HR 65 | Temp 98.7°F | Ht 68.0 in | Wt 156.6 lb

## 2017-01-05 DIAGNOSIS — Z125 Encounter for screening for malignant neoplasm of prostate: Secondary | ICD-10-CM

## 2017-01-05 DIAGNOSIS — Z1329 Encounter for screening for other suspected endocrine disorder: Secondary | ICD-10-CM

## 2017-01-05 DIAGNOSIS — Z113 Encounter for screening for infections with a predominantly sexual mode of transmission: Secondary | ICD-10-CM

## 2017-01-05 DIAGNOSIS — Z0000 Encounter for general adult medical examination without abnormal findings: Secondary | ICD-10-CM

## 2017-01-05 DIAGNOSIS — R5383 Other fatigue: Secondary | ICD-10-CM

## 2017-01-05 DIAGNOSIS — Z131 Encounter for screening for diabetes mellitus: Secondary | ICD-10-CM

## 2017-01-05 DIAGNOSIS — Z13220 Encounter for screening for lipoid disorders: Secondary | ICD-10-CM

## 2017-01-05 DIAGNOSIS — Z130 Encounter for screening for diseases of the blood and blood-forming organs and certain disorders involving the immune mechanism: Secondary | ICD-10-CM

## 2017-01-05 LAB — CBC
HCT: 44.7 % (ref 39.0–52.0)
Hemoglobin: 14.6 g/dL (ref 13.0–17.0)
MCHC: 32.7 g/dL (ref 30.0–36.0)
MCV: 90.2 fl (ref 78.0–100.0)
Platelets: 174 10*3/uL (ref 150.0–400.0)
RBC: 4.95 Mil/uL (ref 4.22–5.81)
RDW: 13.2 % (ref 11.5–15.5)
WBC: 5.1 10*3/uL (ref 4.0–10.5)

## 2017-01-05 LAB — HIV ANTIBODY (ROUTINE TESTING W REFLEX): HIV 1&2 Ab, 4th Generation: NONREACTIVE

## 2017-01-05 LAB — COMPREHENSIVE METABOLIC PANEL
ALT: 21 U/L (ref 0–53)
AST: 30 U/L (ref 0–37)
Albumin: 4.6 g/dL (ref 3.5–5.2)
Alkaline Phosphatase: 57 U/L (ref 39–117)
BUN: 19 mg/dL (ref 6–23)
CHLORIDE: 102 meq/L (ref 96–112)
CO2: 32 meq/L (ref 19–32)
CREATININE: 1.17 mg/dL (ref 0.40–1.50)
Calcium: 9.7 mg/dL (ref 8.4–10.5)
GFR: 84.61 mL/min (ref >60.00)
Glucose, Bld: 96 mg/dL (ref 70–99)
Potassium: 4.5 mEq/L (ref 3.5–5.1)
SODIUM: 138 meq/L (ref 135–145)
Total Bilirubin: 0.6 mg/dL (ref 0.2–1.2)
Total Protein: 7 g/dL (ref 6.0–8.3)

## 2017-01-05 LAB — LIPID PANEL
CHOL/HDL RATIO: 3
Cholesterol: 158 mg/dL (ref 0–200)
HDL: 48.5 mg/dL (ref >39.00)
LDL CALC: 95 mg/dL (ref 0–99)
NonHDL: 109.94
TRIGLYCERIDES: 76 mg/dL (ref 0.0–149.0)
VLDL: 15.2 mg/dL (ref 0.0–40.0)

## 2017-01-05 LAB — VITAMIN D 25 HYDROXY (VIT D DEFICIENCY, FRACTURES): VITD: 37.62 ng/mL (ref 30.00–100.00)

## 2017-01-05 LAB — HEMOGLOBIN A1C: HEMOGLOBIN A1C: 6.1 % (ref 4.6–6.5)

## 2017-01-05 LAB — TSH: TSH: 1.69 u[IU]/mL (ref 0.35–4.50)

## 2017-01-05 LAB — PSA: PSA: 0.52 ng/mL (ref 0.10–4.00)

## 2017-01-05 NOTE — Progress Notes (Signed)
Pre visit review using our clinic review tool, if applicable. No additional management support is needed unless otherwise documented below in the visit note. 

## 2017-01-05 NOTE — Patient Instructions (Addendum)
It was nice to see you today!  I will be in touch with your labs asap Keep up the good work with physical activity and diet- your weight and BP look fine.

## 2017-01-05 NOTE — Progress Notes (Signed)
[facility] [address], [address] [place], [place] [address] [phone]  Date:  01/05/2017   Name:  Alejandro Beltran   DOB:  [birth year]   MRN:  [id number]  PCP:  Abbe Amsterdam, MD    Chief Complaint: Annual Exam (Pt here for CPE. )   History of Present Illness:  Alejandro Beltran is a 51 y.o. very pleasant male patient who presents with the following:  CPE done last March- history of ED, post- traumatic glaucoma, knee problems Last labs were in March of 2017  His knee bothers him more now.  It is stopping him from exercising as much as he used to.  He has noted some back pain as well recently.  He was in the [employer] and had some injuries. He is not ready to consider surgery at this point  He does go to the [place] [place]  He is not fasting today- he had a couple of smoothies He uses cialis as needed for ED- he prefers this to viagra Would like STI screening today and a vitamin D level No CP or SOB Overall he is feeling quite well History of peanut allergy- has epipen  BP Readings from Last 3 Encounters:  01/05/17 134/82  12/01/16 120/76  07/31/16 137/83   Wt Readings from Last 3 Encounters:  01/05/17 156 lb 9.6 oz (71 kg)  07/31/16 165 lb 12.8 oz (75.2 kg)  07/11/16 161 lb 3.2 oz (73.1 kg)     Lab Results  Component Value Date   HGBA1C 5.7 (H) 03/21/2016     Lab Results  Component Value Date   PSA 0.46 12/05/2015   PSA 0.60 11/11/2012     Patient Active Problem List   Diagnosis Date Noted  . Snoring 04/02/2016  . Osteoarthritis of left knee 12/22/2014  . Peanut allergy   . Acute meniscal tear of knee   . Glaucoma   . Acute medial meniscal tear   . Right eye injury 11/11/2012    Past Medical History:  Diagnosis Date  . Acute medial meniscal tear    left  . Acute meniscal tear of knee   . Allergy   . Glaucoma   . Peanut allergy     Past Surgical History:  Procedure Laterality Date  .  EYE SURGERY     rt retina sx  . MENISCUS REPAIR  08/2015    Social History  Substance Use Topics  . Smoking status: Passive Smoke Exposure - Never Smoker    Years: 1.00    Types: Cigars  . Smokeless tobacco: Never Used  . Alcohol use 1.2 oz/week    2 Standard drinks or equivalent per week     Comment: socially    Family History  Problem Relation Age of Onset  . Hypertension Mother   . Anuerysm Father   . Diabetes Maternal Grandmother   . Stroke Maternal Grandmother   . Cancer Maternal Grandfather   . Colon cancer Neg Hx     Allergies  Allergen Reactions  . Peanuts [Peanut Oil] Anaphylaxis    Exotic nuts    Medication list has been reviewed and updated.  Current Outpatient Prescriptions on File Prior to Visit  Medication Sig Dispense Refill  . brimonidine-timolol (COMBIGAN) 0.2-0.5 % ophthalmic solution Place 1 drop into both eyes every 12 (twelve) hours.    . diclofenac (VOLTAREN) 75 MG EC tablet Take 1 tablet (75 mg total) by mouth 2 (two)  times daily. Take with food 30 tablet 0  . EPINEPHrine 0.3 mg/0.3 mL IJ SOAJ injection Inject 0.3 mLs (0.3 mg total) into the muscle once. 2 Device 2  . metaxalone (SKELAXIN) 800 MG tablet Take 1 tablet (800 mg total) by mouth 3 (three) times daily. 30 tablet 0  . Multiple Vitamin (MULTIVITAMIN) tablet Take 1 tablet by mouth daily.    . sildenafil (VIAGRA) 100 MG tablet Take a 1/2 or whole tablet daily as needed for ED 18 tablet 3  . traMADol (ULTRAM) 50 MG tablet Take 1 tablet (50 mg total) by mouth every 6 (six) hours as needed. 20 tablet 0   No current facility-administered medications on file prior to visit.     Review of Systems:  As per HPI- otherwise negative.   Physical Examination: Vitals:   01/05/17 1031  BP: 134/82  Pulse: 65  Temp: 98.7 F (37.1 C)   Vitals:   01/05/17 1031  Weight: 156 lb 9.6 oz (71 kg)  Height:  (1.727 m)   Body mass index is 23.81 kg/m. Ideal Body Weight: Weight in (lb) to have  BMI = 25: 164.1  GEN: WDWN, NAD, Non-toxic, A & O x 3, normal weight, looks well HEENT: Atraumatic, Normocephalic. Neck supple. No masses, No LAD.  Bilateral TM wnl, oropharynx normal.  PEERL,EOMI on left, right eye has chronic damage Ears and Nose: No external deformity. CV: RRR, No M/G/R. No JVD. No thrill. No extra heart sounds. PULM: CTA B, no wheezes, crackles, rhonchi. No retractions. No resp. distress. No accessory muscle use. ABD: S, NT, ND, +BS. No rebound. No HSM. EXTR: No c/c/e NEURO Normal gait.  PSYCH: Normally interactive. Conversant. Not depressed or anxious appearing.  Calm demeanor.  Normal testes and penis   Assessment and Plan: Physical exam  Screening for prostate cancer - Plan: PSA  Screening for hyperlipidemia - Plan: Lipid panel  Screening for deficiency anemia - Plan: CBC  Screening for thyroid disorder - Plan: TSH  Screening for diabetes mellitus - Plan: Comprehensive metabolic panel, Hemoglobin A1c  Routine screening for STI (sexually transmitted infection) - Plan: Hepatitis B surface antibody, Hepatitis C antibody, HIV antibody, RPR, Hepatitis B surface antigen  Fatigue, unspecified type - Plan: Vitamin D (25 hydroxy)  Here today for a CPE Labs pending as above Continue cialis for ED prn- does not need a refill today Continue exercise and healthy lifestyle   Signed Abbe Amsterdam, MD

## 2017-01-06 ENCOUNTER — Encounter: Payer: Self-pay | Admitting: Family Medicine

## 2017-01-06 DIAGNOSIS — R7303 Prediabetes: Secondary | ICD-10-CM | POA: Insufficient documentation

## 2017-01-06 LAB — RPR

## 2017-01-06 LAB — HEPATITIS B SURFACE ANTIGEN: Hepatitis B Surface Ag: NEGATIVE

## 2017-01-06 LAB — HEPATITIS C ANTIBODY: HCV Ab: NEGATIVE

## 2017-01-06 LAB — HEPATITIS B SURFACE ANTIBODY, QUANTITATIVE: Hep B S AB Quant (Post): <5 m[IU]/mL

## 2017-01-08 ENCOUNTER — Telehealth: Payer: Self-pay | Admitting: Family Medicine

## 2017-01-08 DIAGNOSIS — N528 Other male erectile dysfunction: Secondary | ICD-10-CM

## 2017-01-08 MED ORDER — TADALAFIL 10 MG PO TABS: ORAL_TABLET | ORAL | 2 refills | Status: DC

## 2017-01-08 NOTE — Telephone Encounter (Signed)
Caller name: Relationship to patient: Self Can be reached: [phone]  Pharmacy:  [facility] [address] ([address]), [place] - [address] [phone] (Phone) [phone] (Fax)     Reason for call: Provider was suppose to change his Rx for sildenafil (VIAGRA) 100 MG tablet

## 2017-01-08 NOTE — Telephone Encounter (Signed)
He wanted to change from viagra to cialis. Will do so for him, send to [facility]   Meds ordered this encounter  Medications  . tadalafil (CIALIS) 10 MG tablet    Sig: Take 5 to 20 mg by mouth as needed for ED.  Max 20 mg in 24 hours    Dispense:  90 tablet    Refill:  2

## 2017-02-04 DIAGNOSIS — M25562 Pain in left knee: Secondary | ICD-10-CM | POA: Insufficient documentation

## 2017-03-24 ENCOUNTER — Telehealth: Payer: Self-pay | Admitting: Family Medicine

## 2017-03-24 NOTE — Telephone Encounter (Signed)
Pt called in because he said that he no longer want to take CIALIS he don't like it. Pt would like to switch to Viagra. Pt would like to have Rx sent to:    Pharmacy: [facility] [place] - [address]

## 2017-03-26 MED ORDER — SILDENAFIL CITRATE 100 MG PO TABS
50.0000 mg | ORAL_TABLET | Freq: Every day | ORAL | 5 refills | Status: DC | PRN
Start: 2017-03-26 — End: 2018-05-23

## 2017-05-14 ENCOUNTER — Other Ambulatory Visit: Payer: Self-pay | Admitting: Family Medicine

## 2017-05-14 DIAGNOSIS — Z889 Allergy status to unspecified drugs, medicaments and biological substances status: Secondary | ICD-10-CM

## 2017-05-14 DIAGNOSIS — M25562 Pain in left knee: Secondary | ICD-10-CM

## 2017-09-29 IMAGING — CR DG HAND COMPLETE 3+V*R*
3 series · 3 of 3 positions shown · non-contrast
Comparison: 09/24/2010

CLINICAL DATA: Pt fell down steps 2 days ago and is having pain at
1st MCP area and proximal phalanx of 3rd digit

EXAM:
RIGHT HAND - COMPLETE 3+ VIEW

[x hand pa right]
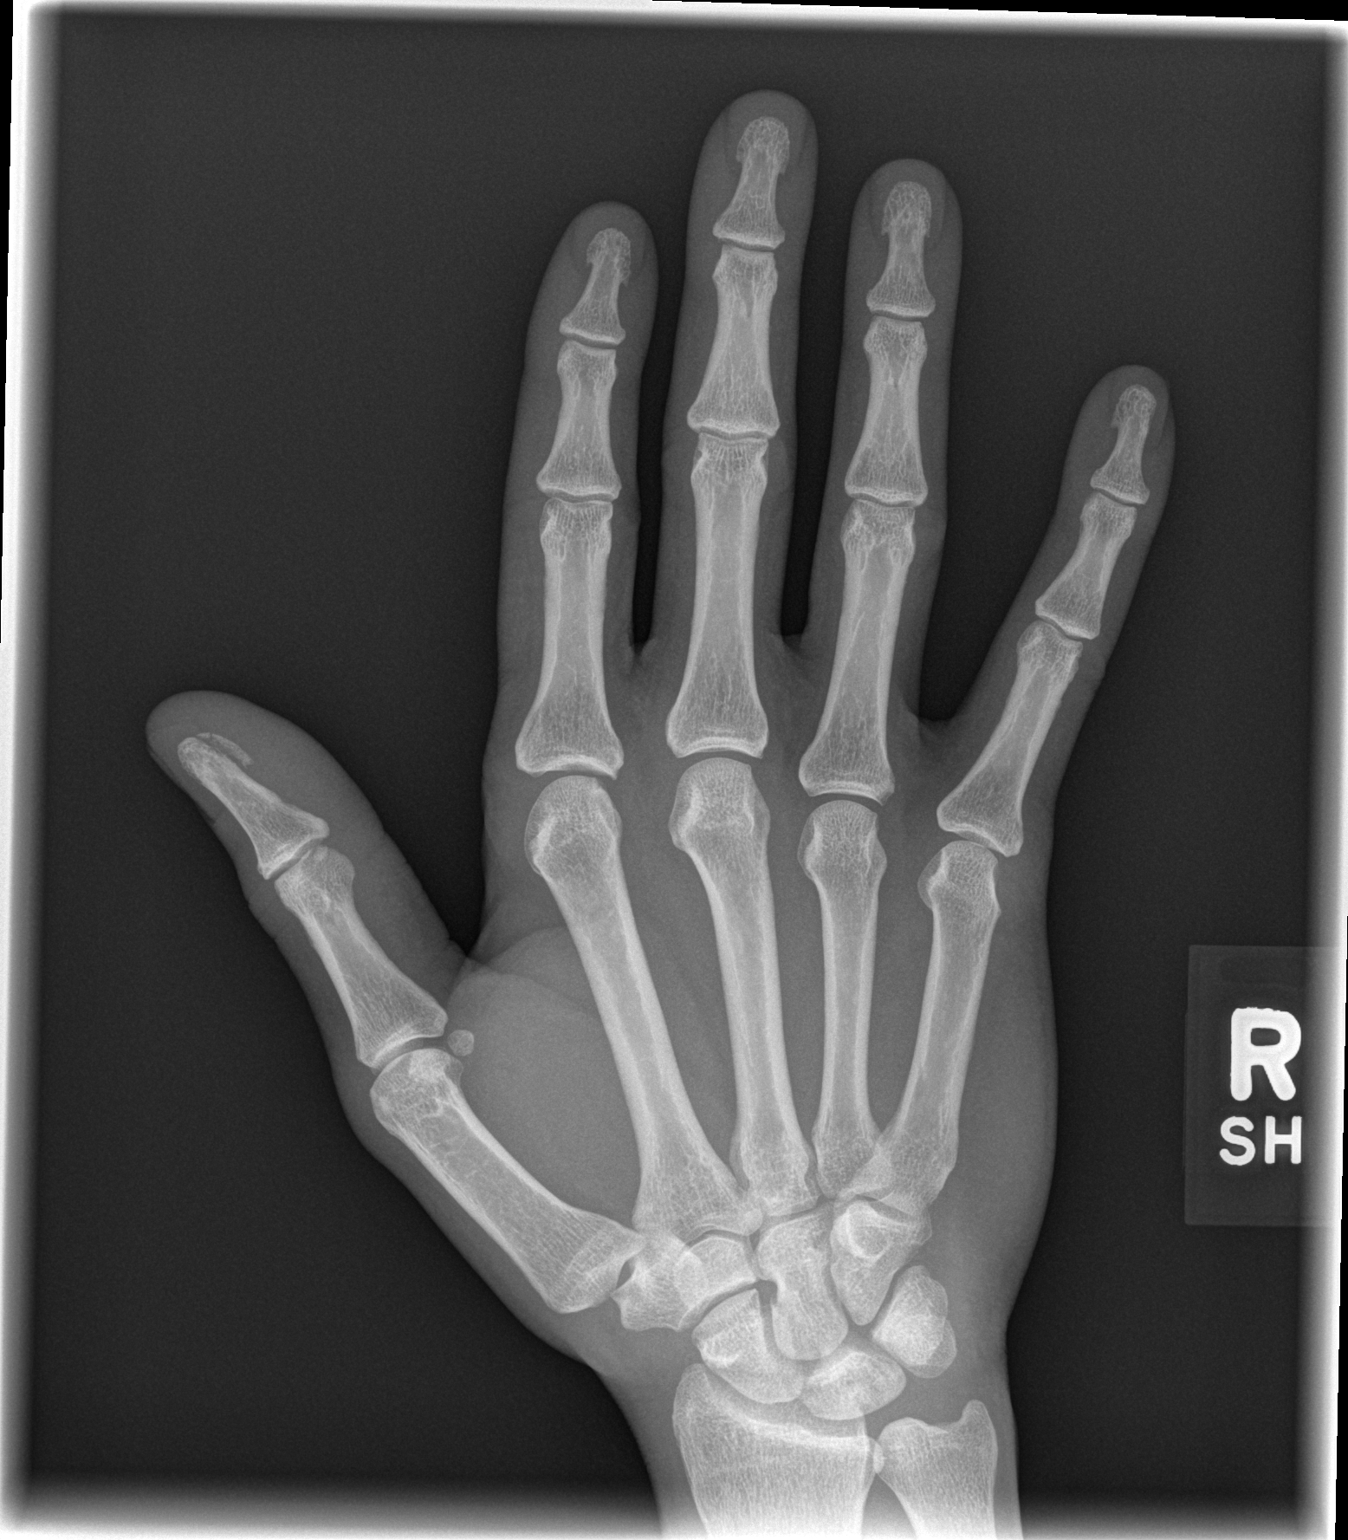

[x hand oblique right]
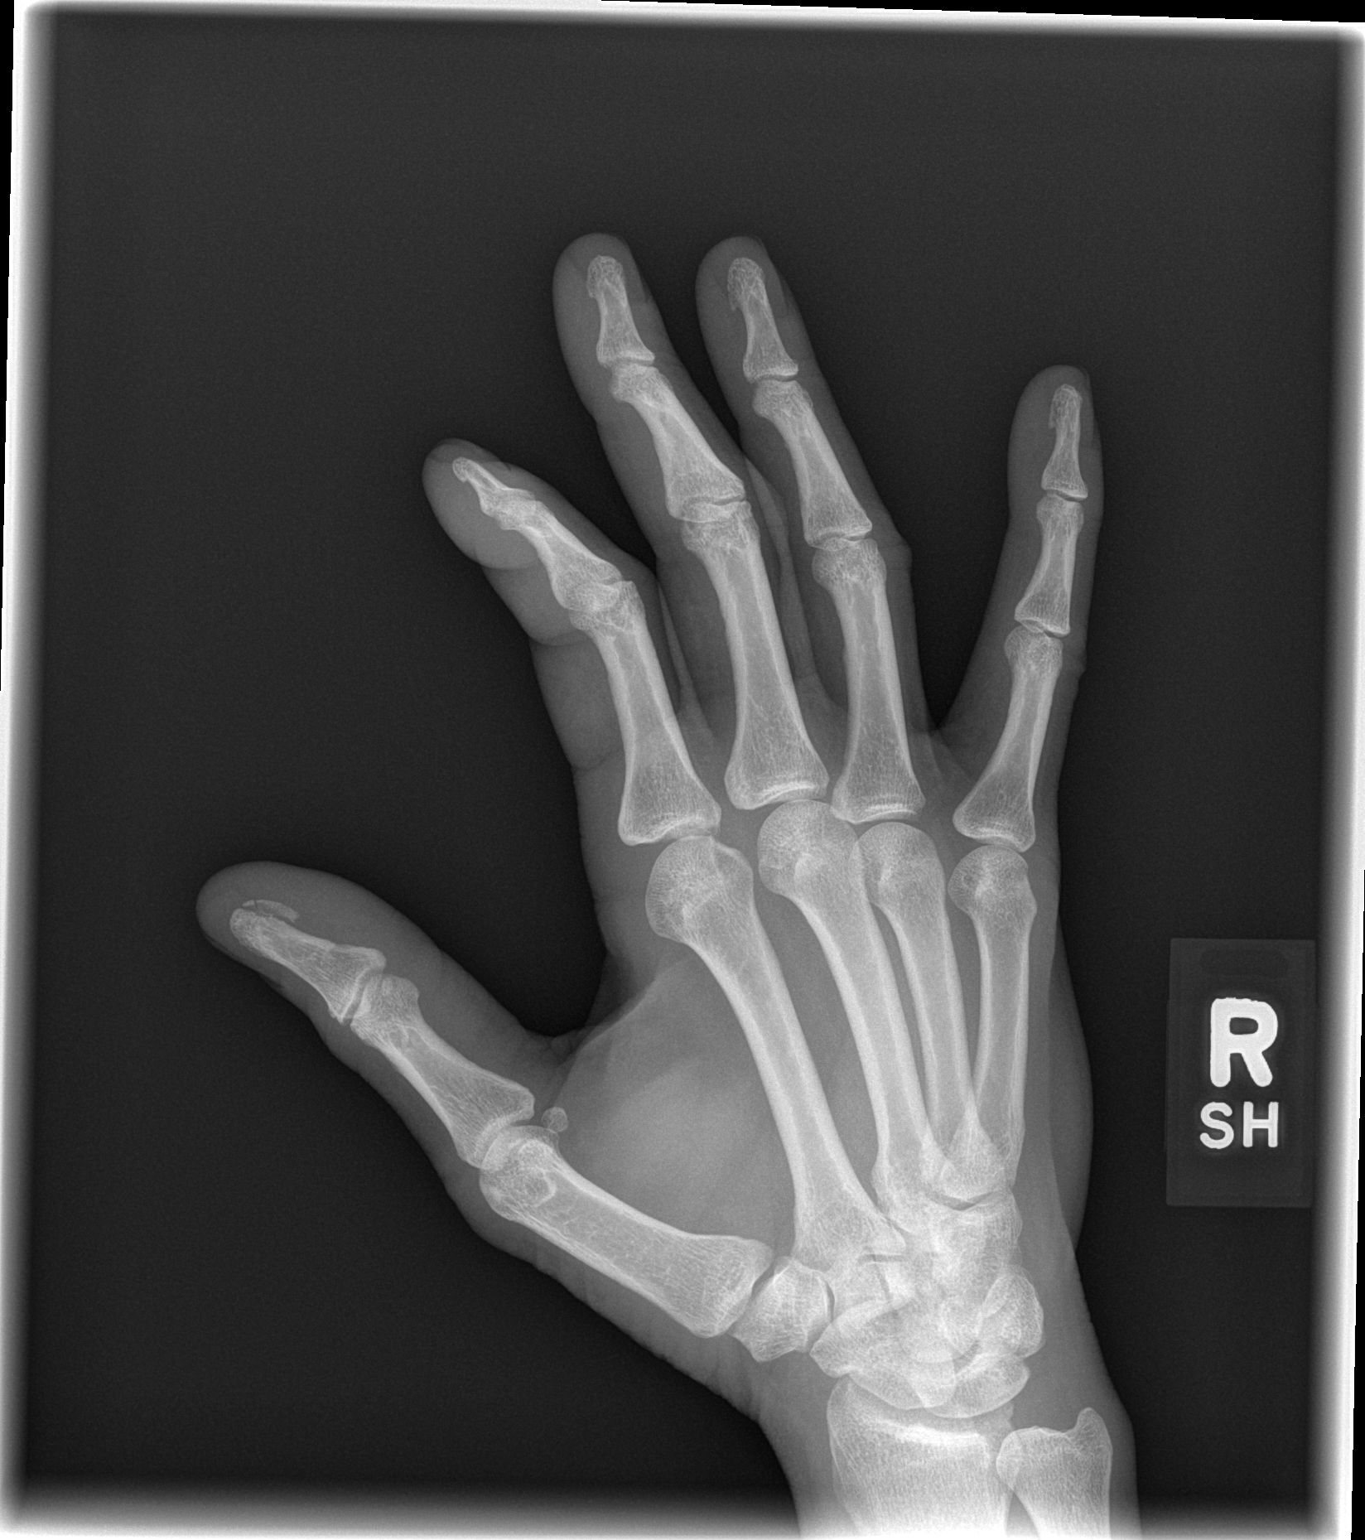

[x hand lat right]
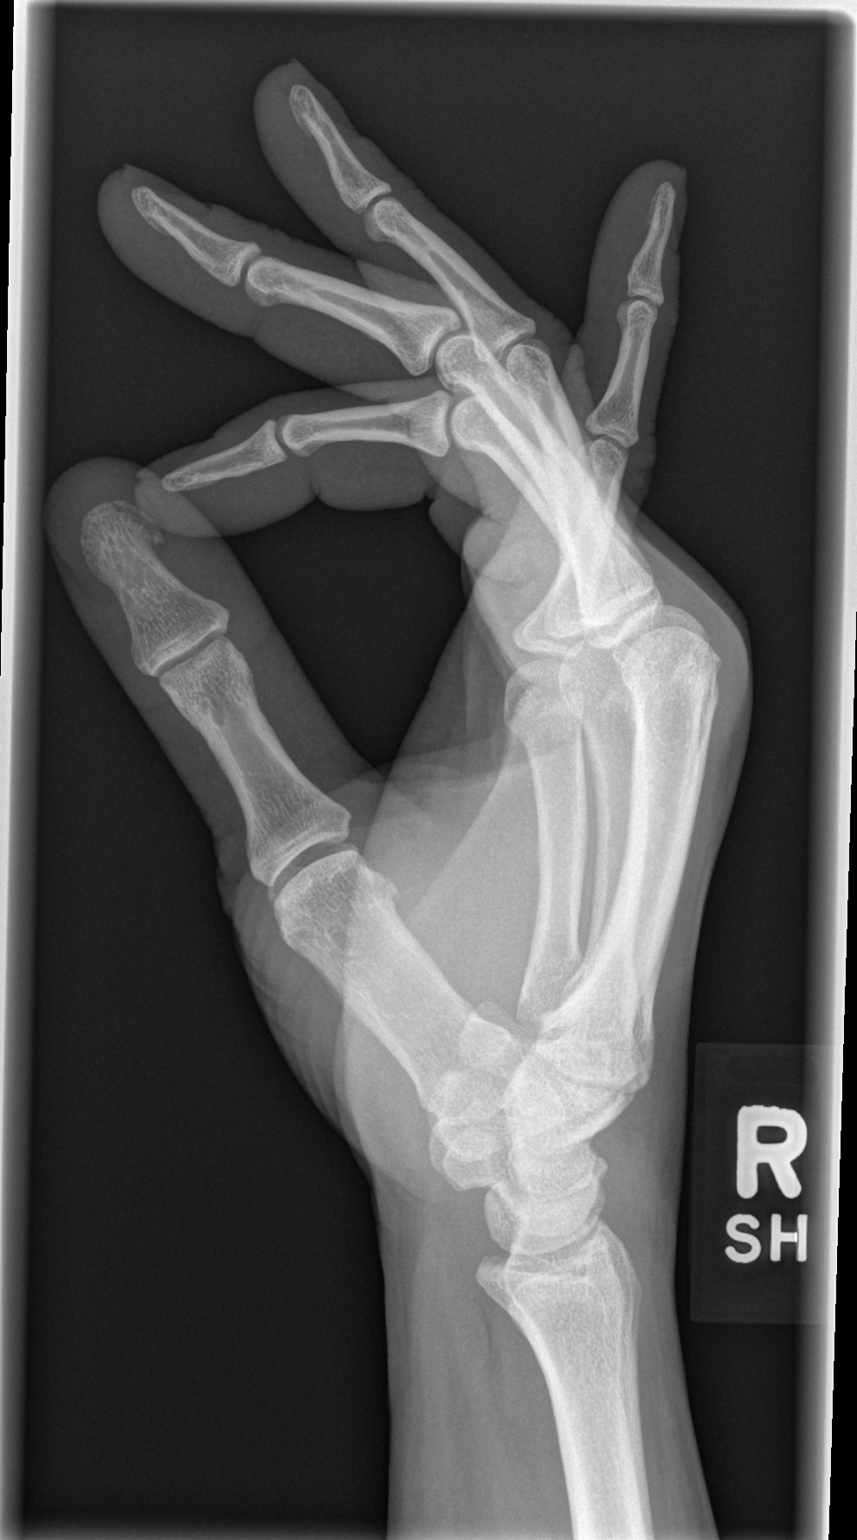

[3 of 3 positions shown; findings below may reference images not displayed]

FINDINGS: There is a fracture of the distal tuft of the right thumb, along the
anterior medial margin, nondisplaced.

No other fractures. The joints are normally spaced and aligned. Mild
distal right thumb soft tissue swelling.
IMPRESSION: 1. Nondisplaced fracture of the distal tuft of the right thumb.
2. No other fractures.  No dislocation.

## 2017-09-29 IMAGING — DX DG WRIST COMPLETE 3+V*R*
4 series · 4 of 4 positions shown · non-contrast
Comparison: None.

CLINICAL DATA: Pt fell down steps yesterday and is having pain
lateral wrist with pain at 1st mcp area

EXAM:
RIGHT WRIST - COMPLETE 3+ VIEW

[wrist pa]
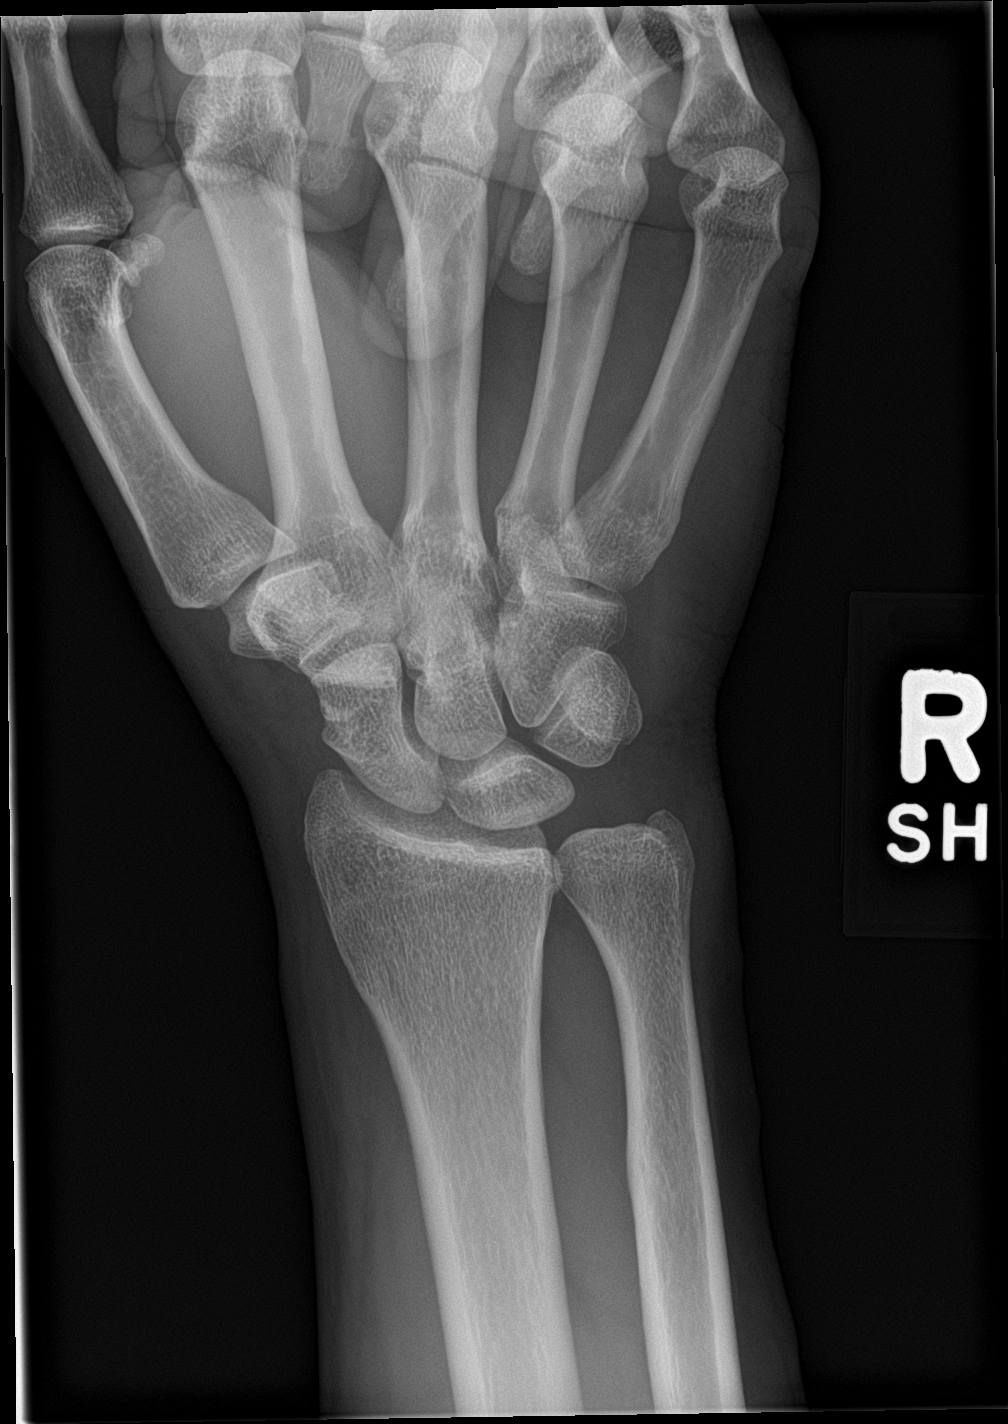

[wrist obl]
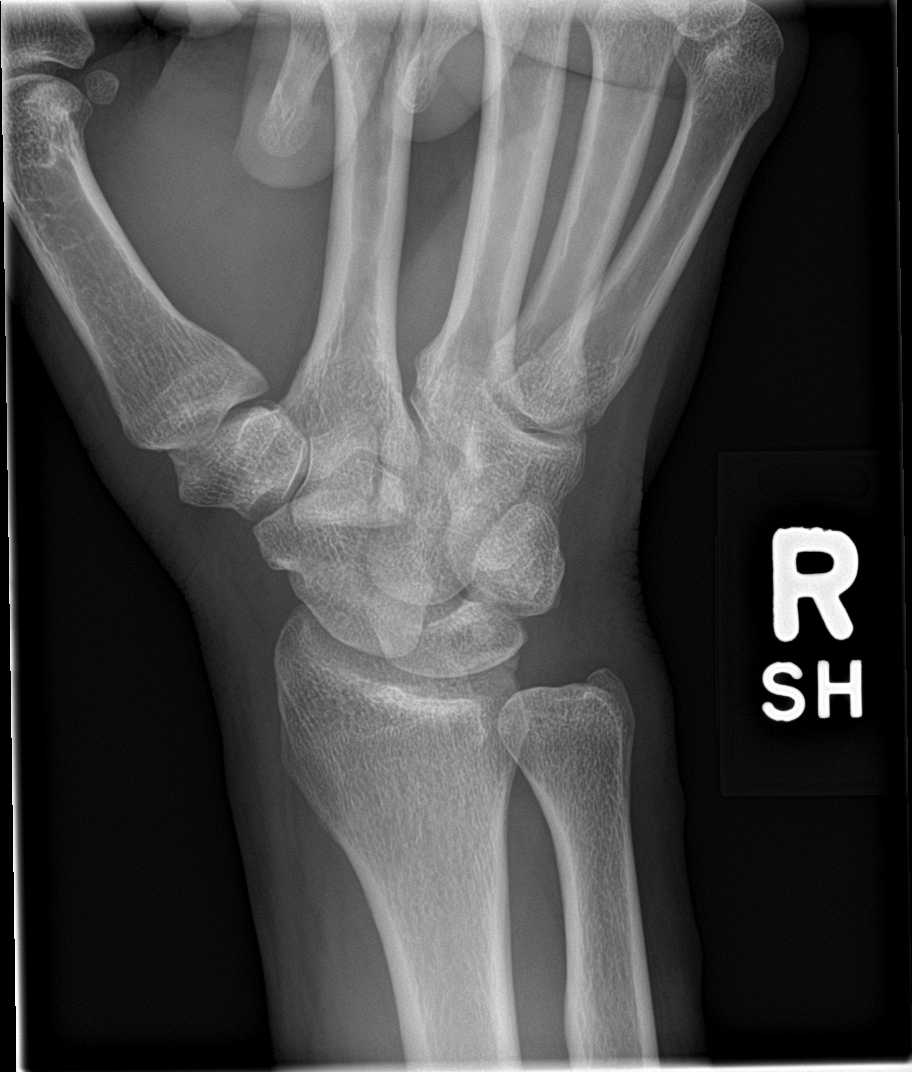

[wrist lat]
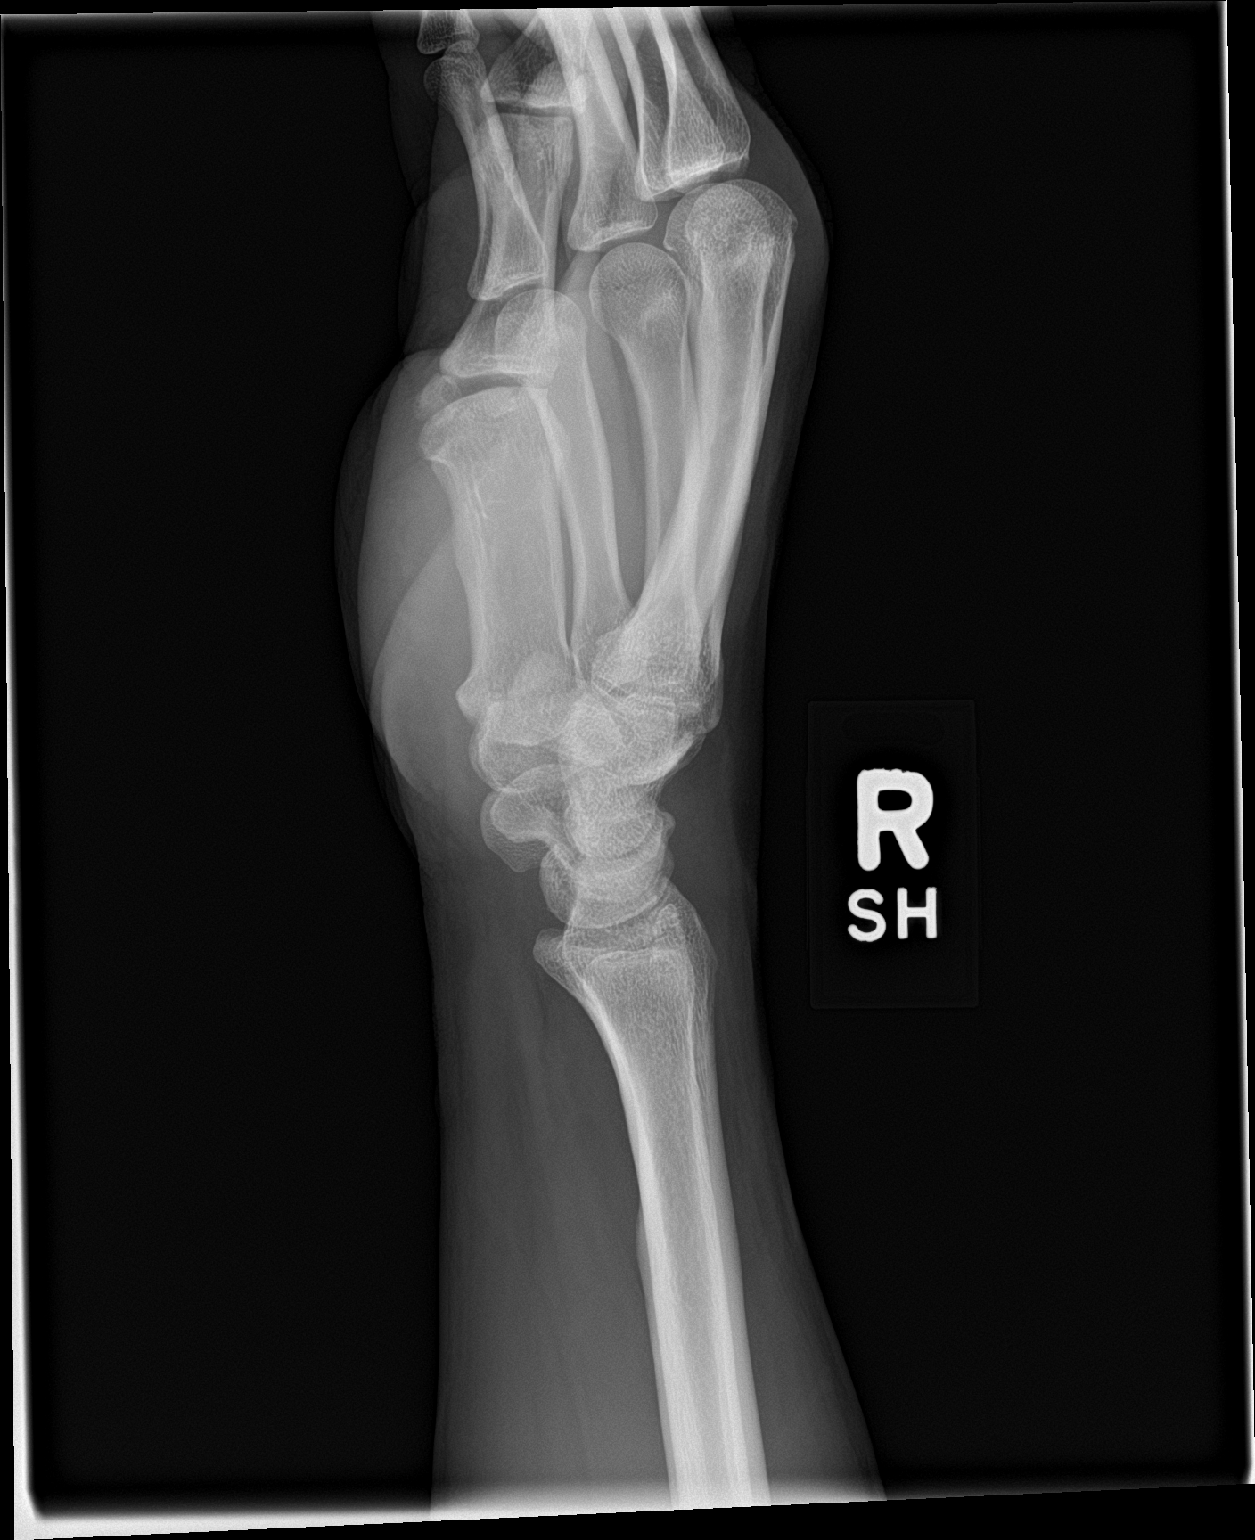

[wrist navicular]
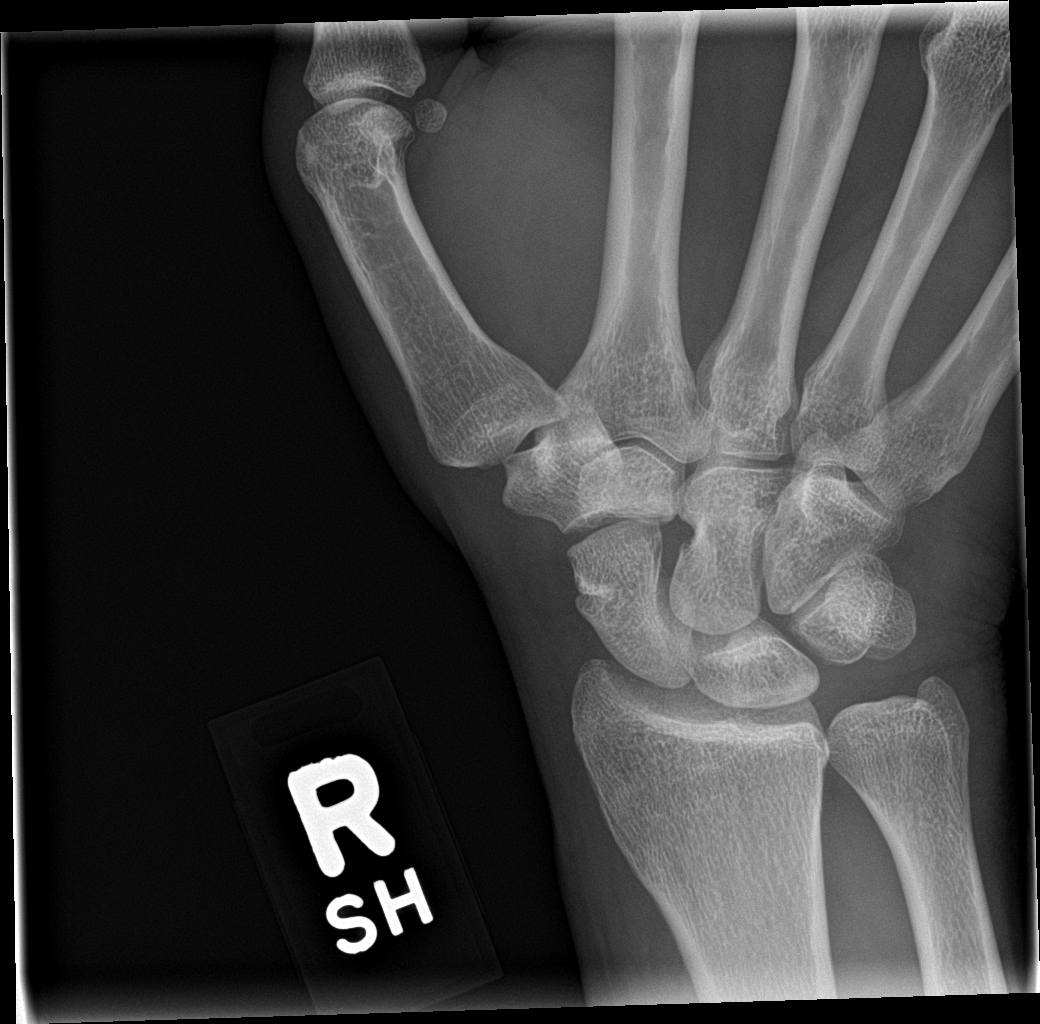

[4 of 4 positions shown; findings below may reference images not displayed]

FINDINGS: There is no evidence of fracture or dislocation. There is no
evidence of arthropathy or other focal bone abnormality. Soft
tissues are unremarkable.
IMPRESSION: Negative.

## 2017-10-11 ENCOUNTER — Encounter (HOSPITAL_COMMUNITY): Payer: Self-pay | Admitting: Family Medicine

## 2017-10-11 ENCOUNTER — Ambulatory Visit (HOSPITAL_COMMUNITY)
Admission: EM | Admit: 2017-10-11 | Discharge: 2017-10-11 | Disposition: A | Discharge status: Home / Self Care | Attending: Urgent Care | Admitting: Urgent Care

## 2017-10-11 DIAGNOSIS — M79644 Pain in right finger(s): Secondary | ICD-10-CM

## 2017-10-11 DIAGNOSIS — S6991XA Unspecified injury of right wrist, hand and finger(s), initial encounter: Secondary | ICD-10-CM

## 2017-10-11 MED ORDER — CEPHALEXIN 500 MG PO CAPS: 500.0000 mg | ORAL_CAPSULE | Freq: Three times a day (TID) | ORAL | 0 refills | Status: DC

## 2017-10-11 NOTE — ED Triage Notes (Signed)
Pt here for nail issue on the right ring finger. sts worried its infected. The nail is off but denies injury.

## 2017-10-11 NOTE — ED Provider Notes (Signed)
  MRN: [id number] DOB: 01/08/1966  Subjective:   Alejandro Beltran is a 52 y.o. male presenting for 3 week history of right ring finger nail injury. Reports that it slammed in a door. Has finger swelling, pain (that has been improving slowly), redness. He has carefully removed nail in pieces but is very worried about infection given that he still has pain over remaining nail bed. Denies active redness, pain, fever, drainage of pus or bleeding.   Alejandro Beltran is allergic to peanuts [peanut oil].  Alejandro Beltran  has a past medical history of Acute medial meniscal tear, Acute meniscal tear of knee, Allergy, Glaucoma, and Peanut allergy. Also  has a past surgical history that includes Eye surgery and Meniscus repair (08/2015).  Objective:   Vitals: BP 132/89   Pulse (!) 59   Temp 98.2 F (36.8 C)   Resp 18   SpO2 99%   Physical Exam  Constitutional: He is oriented to person, place, and time. He appears well-developed and well-nourished.  Cardiovascular: Normal rate.  Pulmonary/Chest: Effort normal.  Musculoskeletal:       Right hand: He exhibits tenderness (over area outlined near proximal base of nail). He exhibits normal range of motion, no bony tenderness, normal capillary refill and no swelling. Normal sensation noted. Normal strength noted.       Hands: Neurological: He is alert and oriented to person, place, and time.   Assessment and Plan :   Injury to fingernail of right hand, initial encounter  Finger pain, right  Start Keflex as patient is very concerned about nail. I tried to reassure patient that he is healing well but he does still have tenderness. Counseled patient on potential for adverse effects with medications prescribed today, patient verbalized understanding. Return-to-clinic precautions discussed, patient verbalized understanding.     Wallis BambergMani, Destyn Parfitt, PA-C 10/11/17 2255

## 2017-10-24 ENCOUNTER — Emergency Department (HOSPITAL_COMMUNITY)
Admission: EM | Admit: 2017-10-24 | Discharge: 2017-10-24 | Disposition: A | Discharge status: Home / Self Care | Attending: Emergency Medicine | Admitting: Emergency Medicine

## 2017-10-24 ENCOUNTER — Encounter (HOSPITAL_COMMUNITY): Payer: Self-pay | Admitting: Emergency Medicine

## 2017-10-24 DIAGNOSIS — M5442 Lumbago with sciatica, left side: Secondary | ICD-10-CM | POA: Diagnosis not present

## 2017-10-24 DIAGNOSIS — Z7722 Contact with and (suspected) exposure to environmental tobacco smoke (acute) (chronic): Secondary | ICD-10-CM | POA: Insufficient documentation

## 2017-10-24 DIAGNOSIS — M545 Low back pain: Secondary | ICD-10-CM | POA: Diagnosis present

## 2017-10-24 DIAGNOSIS — Z91010 Allergy to peanuts: Secondary | ICD-10-CM | POA: Insufficient documentation

## 2017-10-24 DIAGNOSIS — Z79899 Other long term (current) drug therapy: Secondary | ICD-10-CM | POA: Insufficient documentation

## 2017-10-24 MED ORDER — IBUPROFEN 400 MG PO TABS
600.0000 mg | ORAL_TABLET | Freq: Once | ORAL | Status: AC
  Administered 2017-10-24: 600 mg via ORAL
  Filled 2017-10-24: qty 1

## 2017-10-24 MED ORDER — CYCLOBENZAPRINE HCL 10 MG PO TABS
5.0000 mg | ORAL_TABLET | Freq: Once | ORAL | Status: AC
  Administered 2017-10-24: 5 mg via ORAL
  Filled 2017-10-24: qty 1

## 2017-10-24 MED ORDER — CYCLOBENZAPRINE HCL 10 MG PO TABS: 10.0000 mg | ORAL_TABLET | Freq: Two times a day (BID) | ORAL | 0 refills | Status: DC | PRN

## 2017-10-24 NOTE — ED Provider Notes (Signed)
[facility] EMERGENCY DEPARTMENT Provider Note   CSN: [id number] Arrival date & time: 10/24/17  1758     History   Chief Complaint Chief Complaint  Patient presents with  . Back Pain    HPI Alejandro Beltran is a 52 y.o. male with history of left meniscal tear presents today for evaluation of gradual onset, progressively worsening low back pain which began yesterday around 5911 AM.  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  Pain is in the lower thoracic and lumbar spine with radiation to the bilateral buttocks.  He also notes that for the past 2 weeks he has been having pain and cramping sensation traveling down the posterior aspect of his bilateral lower extremities.  Pain in his back is described as a sharp sensation which worsens with bending and he finds that he has constantly changing positions.  He denies any known history of trauma, bending, lifting, or twisting injury.  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  He denies any numbness, tingling, weakness, fevers, chills, bowel or bladder incontinence, saddle anesthesia, history of cancer, or IV drug use.  He has tried Epsom salt baths but no medications with mild relief of his symptoms.   The history is provided by the patient.    Past Medical History:  Diagnosis Date  . Acute medial meniscal tear    left  . Acute meniscal tear of knee   . Allergy   . Glaucoma   . Peanut allergy     Patient Active Problem List   Diagnosis Date Noted  . Pre-diabetes 01/06/2017  . Snoring 04/02/2016  . Osteoarthritis of left knee 12/22/2014  . Peanut allergy   . Acute meniscal tear of knee   . Glaucoma   . Acute medial meniscal tear   . Right eye injury 11/11/2012    Past Surgical History:  Procedure Laterality Date  . EYE SURGERY     rt retina sx  . MENISCUS REPAIR  08/2015        Home Medications    Prior to Admission medications   Medication Sig Start Date End Date Taking? Authorizing Provider  brimonidine-timolol (COMBIGAN) 0.2-0.5 % ophthalmic solution Place 1 drop into both eyes every 12 (twelve) hours.    [provider]  cephALEXin (KEFLEX) 500 MG capsule Take 1 capsule (500 mg total) by mouth 3 (three) times daily. 10/11/17   Wallis BambergMani, Mario, PA-C  cyclobenzaprine (FLEXERIL) 10 MG tablet Take 1 tablet (10 mg total) by mouth 2 (two) times daily as needed for muscle spasms. 10/24/17   Luevenia MaxinFawze, Maren Wiesen A, PA-C  diclofenac (VOLTAREN) 75 MG EC tablet Take 1 tablet (75 mg total) by mouth 2 (two) times daily. Take with food 12/01/16   Domenick GongMortenson, Ashley, MD  EPINEPHrine (EPIPEN 2-PAK) 0.3 mg/0.3 mL IJ SOAJ injection Inject 0.3 mLs (0.3 mg total) into the muscle once as needed. 05/14/17   Copland, Gwenlyn FoundJessica C, MD  metaxalone (SKELAXIN) 800 MG tablet Take 1 tablet (800 mg total) by mouth 3 (three) times daily. 12/01/16   Domenick GongMortenson, Ashley, MD  Multiple Vitamin (MULTIVITAMIN) tablet Take 1 tablet by mouth daily.    [provider]  sildenafil (VIAGRA) 100 MG tablet Take 0.5-1 tablets (50-100 mg total) by mouth daily as needed for erectile dysfunction. 03/26/17   Copland, Gwenlyn FoundJessica C,  MD  traMADol (ULTRAM) 50 MG tablet Take 1 tablet (50 mg total) by mouth every 6 (six) hours as needed. 12/01/16   Domenick Gong, MD    Family History Family History  Problem Relation Age of Onset  . Hypertension Mother   . Anuerysm Father   . Diabetes Maternal Grandmother   . Stroke Maternal Grandmother   . Cancer Maternal Grandfather   . Colon cancer Neg Hx     Social History Social History   Tobacco Use  . Smoking status: Passive Smoke Exposure - Never Smoker  . Smokeless tobacco: Never Used  Substance Use Topics  . Alcohol use: Yes    Alcohol/week: 1.2 oz    Types: 2 Standard drinks or equivalent per week    Comment: socially  . Drug use: No     Allergies    Peanuts [peanut oil]   Review of Systems Review of Systems  Constitutional: Negative for chills and fever.  Gastrointestinal: Negative for nausea and vomiting.  Musculoskeletal: Positive for back pain. Negative for neck pain.  Neurological: Negative for syncope, weakness and numbness.       No bowel or bladder incontinence     Physical Exam Updated Vital Signs BP 129/72 (BP Location: Right Arm)   Pulse (!) 54   Temp 98.5 F (36.9 C) (Oral)   Resp 16   Ht 5\' 8"  (1.727 m)   Wt 73 kg (161 lb)   SpO2 99%   BMI 24.48 kg/m    Physical Exam  Constitutional: He is oriented to person, place, and time. He appears well-developed and well-nourished. No distress.  HENT:  Head: Normocephalic and atraumatic.  Eyes: Conjunctivae are normal. Right eye exhibits no discharge. Left eye exhibits no discharge.  Neck: No JVD present. No tracheal deviation present.  Cardiovascular: Normal rate.  Pulmonary/Chest: Effort normal.  Abdominal: He exhibits no distension.  Musculoskeletal: He exhibits tenderness. He exhibits no edema.  No midline spine tenderness to palpation, bilateral para thoracic and paralumbar muscle tenderness and spasm, left worse than right.  No deformity, crepitus, or step-off noted.  5/5 strength of BUE and BLE major muscle groups.  Pain elicited with flexion and extension of the lumbar spine.  Straight leg raise positive on the left.  Neurological: He is alert and oriented to person, place, and time. No cranial nerve deficit or sensory deficit. He exhibits normal muscle tone.  Fluent speech, no facial droop, sensation intact to soft touch of bilateral lower extremities, normal gait, and patient able to heel walk and toe walk without difficulty.   Skin: No erythema.  Psychiatric: He has a normal mood and affect. His behavior is normal.  Nursing note and vitals reviewed.    ED Treatments / Results  Labs (all labs ordered are listed, but only abnormal results are  displayed) Labs Reviewed - No data to display  EKG  EKG Interpretation None       Radiology No results found.  Procedures Procedures (including critical care time)  Medications Ordered in ED Medications  cyclobenzaprine (FLEXERIL) tablet 5 mg (not administered)  ibuprofen (ADVIL,MOTRIN) tablet 600 mg (not administered)     Initial Impression / Assessment and Plan / ED Course  I have reviewed the triage vital signs and the nursing notes.  Pertinent labs & imaging results that were available during my care of the patient were reviewed by me and considered in my medical decision making (see chart for details).      Patient with back pain.  Afebrile, vital signs are stable.  No neurological deficits and normal neuro exam.  Patient can walk but states is painful, especially with bending.  No loss of bowel or bladder control.  No concern for cauda equina.  Doubt discitis or spinal abscess.  No fever, night sweats, weight loss, h/o cancer, IVDU.  RICE protocol and pain medicine indicated and discussed with patient.  He will follow-up with his primary care physician or an orthopedist for reevaluation of his symptoms.  Discussed indications for return to the ED. Pt verbalized understanding of and agreement with plan and is safe for discharge home at this time. No complaints prior to discharge.   Final Clinical Impressions(s) / ED Diagnoses   Final diagnoses:  Acute bilateral low back pain with left-sided sciatica    ED Discharge Orders        Ordered    cyclobenzaprine (FLEXERIL) 10 MG tablet  2 times daily PRN     10/24/17 1918      Bennye Alm 10/24/17 1919  Wynetta Fines, MD 10/25/17 [phone]

## 2017-10-24 NOTE — Discharge Instructions (Signed)
1. Medications: Alternate 600 mg of ibuprofen and [phone] mg of Tylenol every 3 hours as needed for pain. Do not exceed 4000 mg of Tylenol daily.  You may take Flexeril as needed for muscle spasm but do not drive, drink alcohol, or operate heavy machinery while on this medication as it may make you drowsy. 2. Treatment: rest, drink plenty of fluids, gentle stretching as discussed, alternate ice and heat 3. Follow Up: Please followup with your primary doctor or orthopedist in 5 days for discussion of your diagnoses and further evaluation after today's visit; Return to the ER for worsening back pain, difficulty walking, loss of bowel or bladder control or other concerning symptoms

## 2017-10-24 NOTE — ED Triage Notes (Signed)
Pt presents to ED for assessment of lower back pain with bilateral low limb cramping x 1 week.  Hx of chronic back injury that "flares up".   Mid and lower back pain.

## 2017-12-08 NOTE — Progress Notes (Addendum)
[facility] [address], [address] [place], [place] [address] [phone] [phone]  Date:  12/09/2017   Name:  Alejandro Beltran   DOB:  12-May-1966   MRN:  [id number]  PCP:  Pearline Cablesopland, [name] C, MD    Chief Complaint: Leg Cramps (worse at night, legs and feet, worsening) and Back Pain (in between shoulders, and middle back, injury to back years ago in [employer])   History of Present Illness:  Alejandro Beltran is a 52 y.o. very pleasant male patient who presents with the following: Last seen by myself about a year ago- [facility]  Here today with concern of leg and foot cramps History of glaucoma, pre-diabetes History of right eye injury in the past   He has noted "serious cramps in my legs and my feet" for 2-3 months now. He tried increasing his dietary potassium but that did not help These mostly occur at night but can occur other times as well.   Does not happen every day, but often enough May last for a few minutes No new or strenuous exercise program Can occur in either leg- in the calves, anterior tibs and feet He did take some yellow mustard last night- he is not sure if this helped or not  He also has noted some back pains - he has noted this for about 2 months Started in his lower back, but now seems to have moved into his mid back and also into his left shoudler  Was in the ER on 2/2 for his back, they gave him some flexeril- 10 pills per chart  He is not sure if this helped   He also visited Dr. Farris HasKramer for his shoulder and back - saw him once but did not feel like they made a good connection,  He would like to seek another opinion   He slammed his right ring finger in a door a few months ago- went to UC on 1/20- was given some keflex The nail turned black and fell off and has not come back yet- he wonders if the nail is going to grow back  Lab Results  Component Value Date   HGBA1C 6.1 01/05/2017      Patient Active Problem List   Diagnosis Date Noted  . Pre-diabetes 01/06/2017  . Snoring 04/02/2016  . Osteoarthritis of left knee 12/22/2014  . Peanut allergy   . Acute meniscal tear of knee   . Glaucoma   . Acute medial meniscal tear   . Right eye injury 11/11/2012    Past Medical History:  Diagnosis Date  . Acute medial meniscal tear    left  . Acute meniscal tear of knee   . Allergy   . Glaucoma   . Peanut allergy     Past Surgical History:  Procedure Laterality Date  . EYE SURGERY     rt retina sx  . MENISCUS REPAIR  08/2015    Social History   Tobacco Use  . Smoking status: Passive Smoke Exposure - Never Smoker  . Smokeless tobacco: Never Used  Substance Use Topics  . Alcohol use: Yes    Alcohol/week: 1.2 oz    Types: 2 Standard drinks or equivalent per week    Comment: socially  . Drug use: No    Family History  Problem Relation Age of Onset  . Hypertension Mother   . Anuerysm Father   . Diabetes  Maternal Grandmother   . Stroke Maternal Grandmother   . Cancer Maternal Grandfather   . Colon cancer Neg Hx     Allergies  Allergen Reactions  . Peanuts [Peanut Oil] Anaphylaxis    Exotic nuts    Medication list has been reviewed and updated.  Current Outpatient Medications on File Prior to Visit  Medication Sig Dispense Refill  . brimonidine-timolol (COMBIGAN) 0.2-0.5 % ophthalmic solution Place 1 drop into both eyes every 12 (twelve) hours.    . cyclobenzaprine (FLEXERIL) 10 MG tablet Take 1 tablet (10 mg total) by mouth 2 (two) times daily as needed for muscle spasms. 10 tablet 0  . diclofenac (VOLTAREN) 75 MG EC tablet Take 1 tablet (75 mg total) by mouth 2 (two) times daily. Take with food 30 tablet 0  . EPINEPHrine (EPIPEN 2-PAK) 0.3 mg/0.3 mL IJ SOAJ injection Inject 0.3 mLs (0.3 mg total) into the muscle once as needed. 2 Device 2  . metaxalone (SKELAXIN) 800 MG tablet Take 1 tablet (800 mg total) by mouth 3 (three) times daily. 30  tablet 0  . Multiple Vitamin (MULTIVITAMIN) tablet Take 1 tablet by mouth daily.    . sildenafil (VIAGRA) 100 MG tablet Take 0.5-1 tablets (50-100 mg total) by mouth daily as needed for erectile dysfunction. 30 tablet 5  . traMADol (ULTRAM) 50 MG tablet Take 1 tablet (50 mg total) by mouth every 6 (six) hours as needed. 20 tablet 0   No current facility-administered medications on file prior to visit.     Review of Systems:  As per HPI- otherwise negative.   Physical Examination: Vitals:   12/09/17 1449  BP: 104/82  Pulse: 76  Resp: 16  SpO2: 98%   Vitals:   12/09/17 1449  Weight: 163 lb 3.2 oz (74 kg)  Height: 5\' 8"  (1.727 m)   Body mass index is 24.81 kg/m. Ideal Body Weight: Weight in (lb) to have BMI = 25: 164.1  GEN: WDWN, NAD, Non-toxic, A & O x 3, looks well, chronic right eye changes  HEENT: Atraumatic, Normocephalic. Neck supple. No masses, No LAD. Ears and Nose: No external deformity. CV: RRR, No M/G/R. No JVD. No thrill. No extra heart sounds. PULM: CTA B, no wheezes, crackles, rhonchi. No retractions. No resp. distress. No accessory muscle use. ABD: S, NT, ND, +BS. No rebound. No HSM. EXTR: No c/c/e NEURO Normal gait.  PSYCH: Normally interactive. Conversant. Not depressed or anxious appearing.  Calm demeanor.  Right ring finger- nail is starting to come back at medial nail bed. Reassured pt that it will likely grow back fully but may be a bit bumpy due to nailbed trauma Calves and feet are benign- no cords, tenderness, swelling, heat He seems to have left RCT tendonitis.  He is tender with ROM of the left shoulder and to palpation of the RCT insertion at the prox humerus  Assessment and Plan: Muscle cramps - Plan: Comprehensive metabolic panel, CK (Creatine Kinase), Ferritin  Chronic left shoulder pain - Plan: Ambulatory referral to Sports Medicine  Traumatic avulsion of nail plate of finger, initial encounter  Muscle cramps- these are really bothersome  to pt.  Will obtain labs to look for any cause and follow-up with him Dr. Pearletha Forge to see him tomorrow about his shoulder Reassurance regarding his fingernail, seems to be growing back Will plan further follow- up pending labs.   Signed Abbe Amsterdam, MD  Received his labs 3/24- gave him a call.  Went over labs- they are really  normal.  Slight elevation of BUN and Ck are non- specific.   He has tried pickle juice, mustard already and did not seem to really help- however he has been better the last couple of days actually He may be a bit dehydrated as evidenced by BUN so he will try and hydrate.  He will also trying drinking some tonic water daily for a few days.  I will send him a copy of his labs, and he will let me know if sx do not resolve.  Will also give him some flexeril to use if needed for muscle spasm  Results for orders placed or performed in visit on 12/09/17  Comprehensive metabolic panel  Result Value Ref Range   Sodium 138 135 - 145 mEq/L   Potassium 4.1 3.5 - 5.1 mEq/L   Chloride 100 96 - 112 mEq/L   CO2 29 19 - 32 mEq/L   Glucose, Bld 79 70 - 99 mg/dL   BUN 25 (H) 6 - 23 mg/dL   Creatinine, Ser 1.61 0.40 - 1.50 mg/dL   Total Bilirubin 0.2 0.2 - 1.2 mg/dL   Alkaline Phosphatase 53 39 - 117 U/L   AST 30 0 - 37 U/L   ALT 28 0 - 53 U/L   Total Protein 7.7 6.0 - 8.3 g/dL   Albumin 5.0 3.5 - 5.2 g/dL   Calcium 09.6 8.4 - 04.5 mg/dL   GFR 40.98 >11.91 mL/min  CK (Creatine Kinase)  Result Value Ref Range   Total CK 291 (H) 7 - 232 U/L  Ferritin  Result Value Ref Range   Ferritin 132.4 22.0 - 322.0 ng/mL

## 2017-12-09 ENCOUNTER — Encounter: Payer: Self-pay | Admitting: Family Medicine

## 2017-12-09 ENCOUNTER — Ambulatory Visit (INDEPENDENT_AMBULATORY_CARE_PROVIDER_SITE_OTHER): Admitting: Family Medicine

## 2017-12-09 VITALS — BP 104/82 | HR 76 | Resp 16 | Ht 68.0 in | Wt 163.2 lb

## 2017-12-09 DIAGNOSIS — M25512 Pain in left shoulder: Secondary | ICD-10-CM | POA: Diagnosis not present

## 2017-12-09 DIAGNOSIS — R252 Cramp and spasm: Secondary | ICD-10-CM

## 2017-12-09 DIAGNOSIS — G8929 Other chronic pain: Secondary | ICD-10-CM | POA: Diagnosis not present

## 2017-12-09 DIAGNOSIS — S61309A Unspecified open wound of unspecified finger with damage to nail, initial encounter: Secondary | ICD-10-CM | POA: Diagnosis not present

## 2017-12-09 NOTE — Patient Instructions (Signed)
Good to see you today!  Please see Dr. Pearletha ForgeHudnall tomorrow at 3pm (3rd floor sports med) for your shoulder We will get some labs today and look for any cause of your cramps. I will be in touch with a plan

## 2017-12-10 ENCOUNTER — Encounter: Payer: Self-pay | Admitting: Family Medicine

## 2017-12-10 ENCOUNTER — Ambulatory Visit (INDEPENDENT_AMBULATORY_CARE_PROVIDER_SITE_OTHER): Admitting: Family Medicine

## 2017-12-10 ENCOUNTER — Ambulatory Visit: Payer: Self-pay

## 2017-12-10 VITALS — BP 125/90 | HR 67 | Ht 68.0 in | Wt 162.0 lb

## 2017-12-10 DIAGNOSIS — M25512 Pain in left shoulder: Secondary | ICD-10-CM

## 2017-12-10 LAB — COMPREHENSIVE METABOLIC PANEL
ALK PHOS: 53 U/L (ref 39–117)
ALT: 28 U/L (ref 0–53)
AST: 30 U/L (ref 0–37)
Albumin: 5 g/dL (ref 3.5–5.2)
BILIRUBIN TOTAL: 0.2 mg/dL (ref 0.2–1.2)
BUN: 25 mg/dL — ABNORMAL HIGH (ref 6–23)
CALCIUM: 10.2 mg/dL (ref 8.4–10.5)
CO2: 29 mEq/L (ref 19–32)
Chloride: 100 mEq/L (ref 96–112)
Creatinine, Ser: 1.2 mg/dL (ref 0.40–1.50)
GFR: 81.87 mL/min (ref >60.00)
Glucose, Bld: 79 mg/dL (ref 70–99)
POTASSIUM: 4.1 meq/L (ref 3.5–5.1)
Sodium: 138 mEq/L (ref 135–145)
TOTAL PROTEIN: 7.7 g/dL (ref 6.0–8.3)

## 2017-12-10 LAB — CK: Total CK: 291 U/L — ABNORMAL HIGH (ref 7–232)

## 2017-12-10 LAB — FERRITIN: FERRITIN: 132.4 ng/mL (ref 22.0–322.0)

## 2017-12-10 MED ORDER — NITROGLYCERIN 0.2 MG/HR TD PT24: MEDICATED_PATCH | TRANSDERMAL | 1 refills | Status: DC

## 2017-12-10 NOTE — Patient Instructions (Signed)
You have a partial thickness tear of your supraspinatus (rotator cuff) along with biceps tenosynovitis. Try to avoid painful activities (overhead activities, lifting with extended arm) as much as possible. Aleve 2 tabs twice a day with food OR ibuprofen 3 tabs three times a day with food for pain and inflammation as needed. Can take tylenol in addition to this. Nitro patches 1/4th patch to affected shoulder, change daily I wouldn't recommend an injection with you having a partial tear. Start physical therapy at [facility] with transition to home exercise program. Do home exercise program with theraband and scapular stabilization exercises daily 3 sets of 10 once a day. Follow up with me in 6 weeks.

## 2017-12-13 ENCOUNTER — Encounter: Payer: Self-pay | Admitting: Family Medicine

## 2017-12-13 DIAGNOSIS — M25512 Pain in left shoulder: Secondary | ICD-10-CM | POA: Insufficient documentation

## 2017-12-13 MED ORDER — CYCLOBENZAPRINE HCL 10 MG PO TABS: ORAL_TABLET | ORAL | 0 refills | Status: DC

## 2017-12-13 NOTE — Assessment & Plan Note (Signed)
2/2 partial thickness supraspinatus tear.  Aleve or ibuprofen as needed.  Start nitro patches, physical therapy with home exercise program.  Advised against subacromial injection with partial tear.  F/u in 6 weeks.

## 2017-12-13 NOTE — Progress Notes (Signed)
PCP and consultation requested by: Copland, Gwenlyn Found, MD  Subjective:   HPI: Patient is a 52 y.o. male here for left shoulder pain.  Patient reports for about 2-3 months he has had lateral left shoulder pain. Pain level 5 out of 10 and sharp. Worse at night difficult lying on the shoulder.  Pain can radiate to the back of the shoulder as well. Pain is worse with lifting items. Denies known injury or trauma. No numbness, skin changes. He is right-handed.  Past Medical History:  Diagnosis Date  . Acute medial meniscal tear    left  . Acute meniscal tear of knee   . Allergy   . Glaucoma   . Peanut allergy     Current Outpatient Medications on File Prior to Visit  Medication Sig Dispense Refill  . brimonidine-timolol (COMBIGAN) 0.2-0.5 % ophthalmic solution Place 1 drop into both eyes every 12 (twelve) hours.    . cyclobenzaprine (FLEXERIL) 10 MG tablet Take 1/2 or 1 twice daily as needed for muscle spasm.  Do not take with any other sedating substance, do not take if driving 20 tablet 0  . diclofenac (VOLTAREN) 75 MG EC tablet Take 1 tablet (75 mg total) by mouth 2 (two) times daily. Take with food 30 tablet 0  . EPINEPHrine (EPIPEN 2-PAK) 0.3 mg/0.3 mL IJ SOAJ injection Inject 0.3 mLs (0.3 mg total) into the muscle once as needed. 2 Device 2  . Multiple Vitamin (MULTIVITAMIN) tablet Take 1 tablet by mouth daily.    . sildenafil (VIAGRA) 100 MG tablet Take 0.5-1 tablets (50-100 mg total) by mouth daily as needed for erectile dysfunction. 30 tablet 5  . traMADol (ULTRAM) 50 MG tablet Take 1 tablet (50 mg total) by mouth every 6 (six) hours as needed. 20 tablet 0   No current facility-administered medications on file prior to visit.     Past Surgical History:  Procedure Laterality Date  . EYE SURGERY     rt retina sx  . MENISCUS REPAIR  08/2015    Allergies  Allergen Reactions  . Peanuts [Peanut Oil] Anaphylaxis    Exotic nuts    Social History   Socioeconomic History   . Marital status: Married    Spouse name: Not on file  . Number of children: Not on file  . Years of education: Not on file  . Highest education level: Not on file  Occupational History  . Occupation: Group Optometrist: [employer]  Social Needs  . Financial resource strain: Not on file  . Food insecurity:    Worry: Not on file    Inability: Not on file  . Transportation needs:    Medical: Not on file    Non-medical: Not on file  Tobacco Use  . Smoking status: Passive Smoke Exposure - Never Smoker  . Smokeless tobacco: Never Used  Substance and Sexual Activity  . Alcohol use: Yes    Alcohol/week: 1.2 oz    Types: 2 Standard drinks or equivalent per week    Comment: socially  . Drug use: No  . Sexual activity: Yes    Partners: Female    Birth control/protection: Condom  Lifestyle  . Physical activity:    Days per week: Not on file    Minutes per session: Not on file  . Stress: Not on file  Relationships  . Social connections:    Talks on phone: Not on file    Gets together: Not on file  Attends religious service: Not on file    Active member of club or organization: Not on file    Attends meetings of clubs or organizations: Not on file    Relationship status: Not on file  . Intimate partner violence:    Fear of current or ex partner: Not on file    Emotionally abused: Not on file    Physically abused: Not on file    Forced sexual activity: Not on file  Other Topics Concern  . Not on file  Social History Narrative  . Not on file    Family History  Problem Relation Age of Onset  . Hypertension Mother   . Anuerysm Father   . Diabetes Maternal Grandmother   . Stroke Maternal Grandmother   . Cancer Maternal Grandfather   . Colon cancer Neg Hx     BP 125/90   Pulse 67   Ht 5\' 8"  (1.727 m)   Wt 162 lb (73.5 kg)   BMI 24.63 kg/m   Review of Systems: See HPI above.     Objective:  Physical Exam:  Gen: NAD, comfortable in exam  room  Left shoulder: No swelling, ecchymoses.  No gross deformity. TTP over proximal biceps tendon.  No AC, other tenderness. FROM with painful arc. Positive Hawkins, Neers. Negative Yergasons. Strength 5-/5 with empty can and 5/5 resisted internal/external rotation.  Pain empty can. Negative apprehension. NV intact distally.  Right shoulder: No swelling, ecchymoses.  No gross deformity. No TTP. FROM. Strength 5/5 with empty can and resisted internal/external rotation. NV intact distally.  MSK u/s left shoulder: Biceps tendon intact with small amount of biceps tenosynovitis.  Subscapularis intact without tear.  AC joint with mild arthropathy but no geyser sign.  Infraspinatus intact but with small focal hyperechoic area intrasubstance.  Supraspinatus with partial thickness insertional tear with cortical irregularity at footplate - 5mm of greatest retraction.  No subacromial bursitis.   Assessment & Plan:  1. Left shoulder pain - 2/2 partial thickness supraspinatus tear.  Aleve or ibuprofen as needed.  Start nitro patches, physical therapy with home exercise program.  Advised against subacromial injection with partial tear.  F/u in 6 weeks.

## 2017-12-13 NOTE — Addendum Note (Signed)
Addended by: Abbe AmsterdamOPLAND, Ermine Stebbins C on: 12/13/2017 05:14 PM   Modules accepted: Orders

## 2017-12-28 ENCOUNTER — Telehealth: Payer: Self-pay | Admitting: Family Medicine

## 2017-12-28 NOTE — Telephone Encounter (Signed)
Tried to call pt about possible interaction between his nitroglycerin patches and viagra.  Did not reach, will have to try him back

## 2017-12-29 NOTE — Telephone Encounter (Signed)
Called him to discuss recent possible drug interaction warning I got from his pharmacy He takes prn viagra and also was recently given nitro patches for a shoulder issue.   He is aware of this possible interaction and will not use these 2 meds within 48 hours of each other

## 2018-01-08 NOTE — Progress Notes (Addendum)
[facility] [address], [address] [place], [place] [address] [phone]  Date:  01/11/2018   Name:  Alejandro Beltran   DOB:  11-13-1965   MRN:  [id number]  PCP:  Pearline Cables, MD    Chief Complaint: Annual Exam   History of Present Illness:  Alejandro Beltran is a 52 y.o. very pleasant male patient who presents with the following:  Here today for a CPE History of glaucoma, pre-diabetes, right eye injury years ago  Labs: full done a year ago, but he recently had a CMP Colon: 2014, done by Dr. Russella Dar it looks like. He thinks he had an early colon due to bleeding, we are not sure when his next colon was to be due- heard back from Dr. Russella Dar, due for 10 year recall colonoscopy in 03/2023.  tdap UTD  Last seen a month ago for muscle cramps in his legs and feet, treated with flexeril He also saw Dr. Pearletha Forge for his left shoulder issue   The leg cramps are better- not happening as often/  He is still using the flexeril- he is using this daily at bedtime to help prevent sx.  Advised that this is not a long term med- he will use up what he has and then stop, will let me know if cramps worsen again  He is not fasting- he ate breakfast and then a few crackers His shoulder is better but not yet well - he is doing some yoga and cycling, and is still using the occasional nitro patch  He used to be a runner but had to stop due to knee pain - still wishes that he could get back into running  BP Readings from Last 3 Encounters:  01/11/18 128/80  12/10/17 125/90  12/09/17 104/82   Wt Readings from Last 3 Encounters:  01/11/18 164 lb (74.4 kg)  12/10/17 162 lb (73.5 kg)  12/09/17 163 lb 3.2 oz (74 kg)     Patient Active Problem List   Diagnosis Date Noted  . Left shoulder pain 12/13/2017  . Pre-diabetes 01/06/2017  . Snoring 04/02/2016  . Osteoarthritis of left knee 12/22/2014  . Peanut allergy   . Acute meniscal tear of knee   .  Glaucoma   . Acute medial meniscal tear   . Right eye injury 11/11/2012    Past Medical History:  Diagnosis Date  . Acute medial meniscal tear    left  . Acute meniscal tear of knee   . Allergy   . Glaucoma   . Peanut allergy     Past Surgical History:  Procedure Laterality Date  . EYE SURGERY     rt retina sx  . MENISCUS REPAIR  08/2015    Social History   Tobacco Use  . Smoking status: Passive Smoke Exposure - Never Smoker  . Smokeless tobacco: Never Used  Substance Use Topics  . Alcohol use: Yes    Alcohol/week: 1.2 oz    Types: 2 Standard drinks or equivalent per week    Comment: socially  . Drug use: No    Family History  Problem Relation Age of Onset  . Hypertension Mother   . Anuerysm Father   . Diabetes Maternal Grandmother   . Stroke Maternal Grandmother   . Cancer Maternal Grandfather   . Colon cancer Neg Hx     Allergies  Allergen Reactions  . Other Anaphylaxis    "nuts" -  pt does not recall what kind, he can eat peanuts and peanut butter  . Peanuts [Peanut Oil] Anaphylaxis    Exotic nuts    Medication list has been reviewed and updated.  Current Outpatient Medications on File Prior to Visit  Medication Sig Dispense Refill  . brimonidine-timolol (COMBIGAN) 0.2-0.5 % ophthalmic solution Place 1 drop into both eyes every 12 (twelve) hours.    . cyclobenzaprine (FLEXERIL) 10 MG tablet Take 1/2 or 1 twice daily as needed for muscle spasm.  Do not take with any other sedating substance, do not take if driving 20 tablet 0  . diclofenac (VOLTAREN) 75 MG EC tablet Take 1 tablet (75 mg total) by mouth 2 (two) times daily. Take with food 30 tablet 0  . EPINEPHrine (EPIPEN 2-PAK) 0.3 mg/0.3 mL IJ SOAJ injection Inject 0.3 mLs (0.3 mg total) into the muscle once as needed. 2 Device 2  . Multiple Vitamin (MULTIVITAMIN) tablet Take 1 tablet by mouth daily.    . nitroGLYCERIN (NITRODUR - DOSED IN MG/24 HR) 0.2 mg/hr patch Apply 1/4th patch to affected  shoulder, change daily 30 patch 1  . sildenafil (VIAGRA) 100 MG tablet Take 0.5-1 tablets (50-100 mg total) by mouth daily as needed for erectile dysfunction. 30 tablet 5  . traMADol (ULTRAM) 50 MG tablet Take 1 tablet (50 mg total) by mouth every 6 (six) hours as needed. 20 tablet 0   No current facility-administered medications on file prior to visit.     Review of Systems:  As per HPI- otherwise negative. No fever or chills No CP or SOB No digestion concerns No urinary concerns No penile discharge or testicular masses/bumps No skin concerns or abnormal moles noted    Physical Examination: Vitals:   01/11/18 1406  BP: 128/80  Pulse: (!) 58  Resp: 16  SpO2: 98%   Vitals:   01/11/18 1406  Weight: 164 lb (74.4 kg)  Height: 5\' 8"  (1.727 m)   Body mass index is 24.94 kg/m. Ideal Body Weight: Weight in (lb) to have BMI = 25: 164.1  GEN: WDWN, NAD, Non-toxic, A & O x 3, looks well, s/p right eye injury  HEENT: Atraumatic, Normocephalic. Neck supple. No masses, No LAD.  Bilateral TM wnl, oropharynx normal.  PEERL,EOMI.   Ears and Nose: No external deformity. CV: RRR, No M/G/R. No JVD. No thrill. No extra heart sounds. PULM: CTA B, no wheezes, crackles, rhonchi. No retractions. No resp. distress. No accessory muscle use. ABD: S, NT, ND, +BS. No rebound. No HSM. EXTR: No c/c/e NEURO Normal gait.  PSYCH: Normally interactive. Conversant. Not depressed or anxious appearing.  Calm demeanor.    Assessment and Plan: Physical exam  Screening for hyperlipidemia - Plan: Lipid panel  Screening for diabetes mellitus - Plan: Hemoglobin A1c  Screening for prostate cancer - Plan: PSA  Screening for deficiency anemia - Plan: CBC  Routine screening for STI (sexually transmitted infection) - Plan: RPR, HIV antibody, Hepatitis C antibody, Hepatitis B surface antibody, Hepatitis B surface antigen  Labs pending as above He declines GC/CMZ but does wish to have STI blood work as  above Message to dr Russella Dar re: his last colon, I am not sure when he is due again - heard back from him, 03/2023; will alert pt along with his labs Signed Abbe Amsterdam, MD Received his labs 4/23, letter to pt 5/9- added code for "high risk sexual behavior" as [employer] did not want to cover for routine screening   Results for orders  placed or performed in visit on 01/11/18  CBC  Result Value Ref Range   WBC 4.7 4.0 - 10.5 K/uL   RBC 4.76 4.22 - 5.81 Mil/uL   Platelets 178.0 150.0 - 400.0 K/uL   Hemoglobin 14.4 13.0 - 17.0 g/dL   HCT 09.843.9 11.939.0 - 14.752.0 %   MCV 92.1 78.0 - 100.0 fl   MCHC 32.8 30.0 - 36.0 g/dL   RDW 82.913.6 56.211.5 - 13.015.5 %  Lipid panel  Result Value Ref Range   Cholesterol 153 0 - 200 mg/dL   Triglycerides 865.7100.0 0.0 - 149.0 mg/dL   HDL 84.6947.20 >62.95>39.00 mg/dL   VLDL 28.420.0 0.0 - 13.240.0 mg/dL   LDL Cholesterol 86 0 - 99 mg/dL   Total CHOL/HDL Ratio 3    NonHDL 105.72   PSA  Result Value Ref Range   PSA 0.56 0.10 - 4.00 ng/mL  Hemoglobin A1c  Result Value Ref Range   Hgb A1c MFr Bld 6.1 4.6 - 6.5 %  RPR  Result Value Ref Range   RPR Ser Ql NON-REACTIVE NON-REACTI  HIV antibody  Result Value Ref Range   HIV 1&2 Ab, 4th Generation NON-REACTIVE NON-REACTI  Hepatitis C antibody  Result Value Ref Range   Hepatitis C Ab NON-REACTIVE NON-REACTI   SIGNAL TO CUT-OFF 0.02 <1.00  Hepatitis B surface antibody  Result Value Ref Range   Hepatitis B-Post <5 (L) > OR = 10 mIU/mL  Hepatitis B surface antigen  Result Value Ref Range   Hepatitis B Surface Ag NON-REACTIVE NON-REACTI

## 2018-01-11 ENCOUNTER — Ambulatory Visit (INDEPENDENT_AMBULATORY_CARE_PROVIDER_SITE_OTHER): Admitting: Family Medicine

## 2018-01-11 ENCOUNTER — Encounter: Payer: Self-pay | Admitting: Family Medicine

## 2018-01-11 VITALS — BP 128/80 | HR 58 | Resp 16 | Ht 68.0 in | Wt 164.0 lb

## 2018-01-11 DIAGNOSIS — Z113 Encounter for screening for infections with a predominantly sexual mode of transmission: Secondary | ICD-10-CM

## 2018-01-11 DIAGNOSIS — Z130 Encounter for screening for diseases of the blood and blood-forming organs and certain disorders involving the immune mechanism: Secondary | ICD-10-CM

## 2018-01-11 DIAGNOSIS — Z13220 Encounter for screening for lipoid disorders: Secondary | ICD-10-CM | POA: Diagnosis not present

## 2018-01-11 DIAGNOSIS — Z7251 High risk heterosexual behavior: Secondary | ICD-10-CM | POA: Diagnosis not present

## 2018-01-11 DIAGNOSIS — Z131 Encounter for screening for diabetes mellitus: Secondary | ICD-10-CM | POA: Diagnosis not present

## 2018-01-11 DIAGNOSIS — Z0000 Encounter for general adult medical examination without abnormal findings: Secondary | ICD-10-CM

## 2018-01-11 DIAGNOSIS — Z125 Encounter for screening for malignant neoplasm of prostate: Secondary | ICD-10-CM

## 2018-01-11 NOTE — Patient Instructions (Signed)
Good to see you today- take care and  I will be in touch with your labs asap I will touch base with Dr. Russella DarStark and try to find out when you need your next colonoscopy   Health Maintenance, Male A healthy lifestyle and preventive care is important for your health and wellness. Ask your health care provider about what schedule of regular examinations is right for you. What should I know about weight and diet? Eat a Healthy Diet  Eat plenty of vegetables, fruits, whole grains, low-fat dairy products, and lean protein.  Do not eat a lot of foods high in solid fats, added sugars, or salt.  Maintain a Healthy Weight Regular exercise can help you achieve or maintain a healthy weight. You should:  Do at least 150 minutes of exercise each week. The exercise should increase your heart rate and make you sweat (moderate-intensity exercise).  Do strength-training exercises at least twice a week.  Watch Your Levels of Cholesterol and Blood Lipids  Have your blood tested for lipids and cholesterol every 5 years starting at 52 years of age. If you are at high risk for heart disease, you should start having your blood tested when you are 52 years old. You may need to have your cholesterol levels checked more often if: ? Your lipid or cholesterol levels are high. ? You are older than 52 years of age. ? You are at high risk for heart disease.  What should I know about cancer screening? Many types of cancers can be detected early and may often be prevented. Lung Cancer  You should be screened every year for lung cancer if: ? You are a current smoker who has smoked for at least 30 years. ? You are a former smoker who has quit within the past 15 years.  Talk to your health care provider about your screening options, when you should start screening, and how often you should be screened.  Colorectal Cancer  Routine colorectal cancer screening usually begins at 52 years of age and should be repeated  every 5-10 years until you are 52 years old. You may need to be screened more often if early forms of precancerous polyps or small growths are found. Your health care provider may recommend screening at an earlier age if you have risk factors for colon cancer.  Your health care provider may recommend using home test kits to check for hidden blood in the stool.  A small camera at the end of a tube can be used to examine your colon (sigmoidoscopy or colonoscopy). This checks for the earliest forms of colorectal cancer.  Prostate and Testicular Cancer  Depending on your age and overall health, your health care provider may do certain tests to screen for prostate and testicular cancer.  Talk to your health care provider about any symptoms or concerns you have about testicular or prostate cancer.  Skin Cancer  Check your skin from head to toe regularly.  Tell your health care provider about any new moles or changes in moles, especially if: ? There is a change in a mole's size, shape, or color. ? You have a mole that is larger than a pencil eraser.  Always use sunscreen. Apply sunscreen liberally and repeat throughout the day.  Protect yourself by wearing long sleeves, pants, a wide-brimmed hat, and sunglasses when outside.  What should I know about heart disease, diabetes, and high blood pressure?  If you are 4118-52 years of age, have your blood pressure checked  every 3-5 years. If you are 19 years of age or older, have your blood pressure checked every year. You should have your blood pressure measured twice-once when you are at a hospital or clinic, and once when you are not at a hospital or clinic. Record the average of the two measurements. To check your blood pressure when you are not at a hospital or clinic, you can use: ? An automated blood pressure machine at a pharmacy. ? A home blood pressure monitor.  Talk to your health care provider about your target blood pressure.  If you are  between 66-21 years old, ask your health care provider if you should take aspirin to prevent heart disease.  Have regular diabetes screenings by checking your fasting blood sugar level. ? If you are at a normal weight and have a low risk for diabetes, have this test once every three years after the age of 4. ? If you are overweight and have a high risk for diabetes, consider being tested at a younger age or more often.  A one-time screening for abdominal aortic aneurysm (AAA) by ultrasound is recommended for men aged 56-75 years who are current or former smokers. What should I know about preventing infection? Hepatitis B If you have a higher risk for hepatitis B, you should be screened for this virus. Talk with your health care provider to find out if you are at risk for hepatitis B infection. Hepatitis C Blood testing is recommended for:  Everyone born from [birth year] through 1965.  Anyone with known risk factors for hepatitis C.  Sexually Transmitted Diseases (STDs)  You should be screened each year for STDs including gonorrhea and chlamydia if: ? You are sexually active and are younger than 52 years of age. ? You are older than 52 years of age and your health care provider tells you that you are at risk for this type of infection. ? Your sexual activity has changed since you were last screened and you are at an increased risk for chlamydia or gonorrhea. Ask your health care provider if you are at risk.  Talk with your health care provider about whether you are at high risk of being infected with HIV. Your health care provider may recommend a prescription medicine to help prevent HIV infection.  What else can I do?  Schedule regular health, dental, and eye exams.  Stay current with your vaccines (immunizations).  Do not use any tobacco products, such as cigarettes, chewing tobacco, and e-cigarettes. If you need help quitting, ask your health care provider.  Limit alcohol intake to no  more than 2 drinks per day. One drink equals 12 ounces of beer, 5 ounces of wine, or 1 ounces of hard liquor.  Do not use street drugs.  Do not share needles.  Ask your health care provider for help if you need support or information about quitting drugs.  Tell your health care provider if you often feel depressed.  Tell your health care provider if you have ever been abused or do not feel safe at home. This information is not intended to replace advice given to you by your health care provider. Make sure you discuss any questions you have with your health care provider. Document Released: 03/06/2008 Document Revised: 05/07/2016 Document Reviewed: 06/12/2015 Elsevier Interactive Patient Education  [employer].

## 2018-01-12 LAB — PSA: PSA: 0.56 ng/mL (ref 0.10–4.00)

## 2018-01-12 LAB — LIPID PANEL
CHOLESTEROL: 153 mg/dL (ref 0–200)
HDL: 47.2 mg/dL (ref >39.00)
LDL Cholesterol: 86 mg/dL (ref 0–99)
NonHDL: 105.72
Total CHOL/HDL Ratio: 3
Triglycerides: 100 mg/dL (ref 0.0–149.0)
VLDL: 20 mg/dL (ref 0.0–40.0)

## 2018-01-12 LAB — CBC
HEMATOCRIT: 43.9 % (ref 39.0–52.0)
Hemoglobin: 14.4 g/dL (ref 13.0–17.0)
MCHC: 32.8 g/dL (ref 30.0–36.0)
MCV: 92.1 fl (ref 78.0–100.0)
Platelets: 178 10*3/uL (ref 150.0–400.0)
RBC: 4.76 Mil/uL (ref 4.22–5.81)
RDW: 13.6 % (ref 11.5–15.5)
WBC: 4.7 10*3/uL (ref 4.0–10.5)

## 2018-01-12 LAB — HEPATITIS C ANTIBODY
Hepatitis C Ab: NONREACTIVE
SIGNAL TO CUT-OFF: 0.02 (ref <1.00)

## 2018-01-12 LAB — HEPATITIS B SURFACE ANTIGEN: HEP B S AG: NONREACTIVE

## 2018-01-12 LAB — HEMOGLOBIN A1C: Hgb A1c MFr Bld: 6.1 % (ref 4.6–6.5)

## 2018-01-12 LAB — HEPATITIS B SURFACE ANTIBODY, QUANTITATIVE: Hepatitis B-Post: <5 m[IU]/mL — ABNORMAL LOW (ref >=10)

## 2018-01-12 LAB — RPR: RPR: NONREACTIVE

## 2018-01-12 LAB — HIV ANTIBODY (ROUTINE TESTING W REFLEX): HIV: NONREACTIVE

## 2018-01-21 ENCOUNTER — Ambulatory Visit: Admitting: Family Medicine

## 2018-03-01 ENCOUNTER — Other Ambulatory Visit: Payer: Self-pay

## 2018-03-01 ENCOUNTER — Encounter (HOSPITAL_COMMUNITY): Payer: Self-pay

## 2018-03-01 ENCOUNTER — Emergency Department (HOSPITAL_COMMUNITY)
Admission: EM | Admit: 2018-03-01 | Discharge: 2018-03-01 | Disposition: A | Discharge status: Home / Self Care | Attending: Emergency Medicine | Admitting: Emergency Medicine

## 2018-03-01 DIAGNOSIS — M25512 Pain in left shoulder: Secondary | ICD-10-CM | POA: Diagnosis present

## 2018-03-01 DIAGNOSIS — G8929 Other chronic pain: Secondary | ICD-10-CM | POA: Insufficient documentation

## 2018-03-01 DIAGNOSIS — Z7722 Contact with and (suspected) exposure to environmental tobacco smoke (acute) (chronic): Secondary | ICD-10-CM | POA: Insufficient documentation

## 2018-03-01 DIAGNOSIS — Z91010 Allergy to peanuts: Secondary | ICD-10-CM | POA: Insufficient documentation

## 2018-03-01 DIAGNOSIS — Z79899 Other long term (current) drug therapy: Secondary | ICD-10-CM | POA: Diagnosis not present

## 2018-03-01 MED ORDER — PREDNISONE 10 MG PO TABS: ORAL_TABLET | ORAL | 0 refills | Status: DC

## 2018-03-01 NOTE — ED Provider Notes (Signed)
[facility] EMERGENCY DEPARTMENT Provider Note   CSN: [id number] Arrival date & time: 03/01/18  5621     History   Chief Complaint Chief Complaint  Patient presents with  . Shoulder Pain    HPI Alejandro Beltran is a 52 y.o. male.  Pt comes in with c/o left shoulder pain that has been going on for a couple of months. He states that the pain is getting worse. He state that he was seen by ortho and was told that he has a partial tare. He state that he wants an x-ray to known if it is worse     Past Medical History:  Diagnosis Date  . Acute medial meniscal tear    left  . Acute meniscal tear of knee   . Allergy   . Glaucoma   . Peanut allergy     Patient Active Problem List   Diagnosis Date Noted  . Left shoulder pain 12/13/2017  . Pre-diabetes 01/06/2017  . Snoring 04/02/2016  . Osteoarthritis of left knee 12/22/2014  . Peanut allergy   . Acute meniscal tear of knee   . Glaucoma   . Acute medial meniscal tear   . Right eye injury 11/11/2012    Past Surgical History:  Procedure Laterality Date  . EYE SURGERY     rt retina sx  . MENISCUS REPAIR  08/2015        Home Medications    Prior to Admission medications   Medication Sig Start Date End Date Taking? Authorizing Provider  brimonidine-timolol (COMBIGAN) 0.2-0.5 % ophthalmic solution Place 1 drop into both eyes every 12 (twelve) hours.    [provider]  cyclobenzaprine (FLEXERIL) 10 MG tablet Take 1/2 or 1 twice daily as needed for muscle spasm.  Do not take with any other sedating substance, do not take if driving 11/28/63   Copland, Gwenlyn Found, MD  diclofenac (VOLTAREN) 75 MG EC tablet Take 1 tablet (75 mg total) by mouth 2 (two) times daily. Take with food 12/01/16   Domenick Gong, MD  EPINEPHrine (EPIPEN 2-PAK) 0.3 mg/0.3 mL IJ SOAJ injection Inject 0.3 mLs (0.3 mg total) into the muscle once as needed. 05/14/17   Copland, Gwenlyn Found, MD  Multiple Vitamin (MULTIVITAMIN)  tablet Take 1 tablet by mouth daily.    [provider]  nitroGLYCERIN (NITRODUR - DOSED IN MG/24 HR) 0.2 mg/hr patch Apply 1/4th patch to affected shoulder, change daily 12/10/17   Hudnall, Azucena Fallen, MD  sildenafil (VIAGRA) 100 MG tablet Take 0.5-1 tablets (50-100 mg total) by mouth daily as needed for erectile dysfunction. 03/26/17   Copland, Gwenlyn Found, MD  traMADol (ULTRAM) 50 MG tablet Take 1 tablet (50 mg total) by mouth every 6 (six) hours as needed. 12/01/16   Domenick Gong, MD    Family History Family History  Problem Relation Age of Onset  . Hypertension Mother   . Anuerysm Father   . Diabetes Maternal Grandmother   . Stroke Maternal Grandmother   . Cancer Maternal Grandfather   . Colon cancer Neg Hx     Social History Social History   Tobacco Use  . Smoking status: Passive Smoke Exposure - Never Smoker  . Smokeless tobacco: Never Used  Substance Use Topics  . Alcohol use: Yes    Alcohol/week: 1.2 oz    Types: 2 Standard drinks or equivalent per week    Comment: socially  . Drug use: No     Allergies   Other and  Peanuts [peanut oil]   Review of Systems Review of Systems  All other systems reviewed and are negative.    Physical Exam Updated Vital Signs BP 117/89 (BP Location: Right Arm)   Pulse 64   Temp 98.3 F (36.8 C) (Oral)   Resp 16   Ht 5\' 8"  (1.727 m)   Wt 73.5 kg (162 lb)   SpO2 97%   BMI 24.63 kg/m   Physical Exam  Constitutional: He is oriented to person, place, and time. He appears well-developed and well-nourished.  Cardiovascular: Normal rate.  Pulmonary/Chest: Breath sounds normal.  Musculoskeletal:  Pt doesn't have full rom of the left shoulder. No redness or warmth noted  Neurological: He is alert and oriented to person, place, and time.  Skin: Skin is warm and dry.  Nursing note and vitals reviewed.    ED Treatments / Results  Labs (all labs ordered are listed, but only abnormal results are displayed) Labs Reviewed  - No data to display  EKG None  Radiology No results found.  Procedures Procedures (including critical care time)  Medications Ordered in ED Medications - No data to display   Initial Impression / Assessment and Plan / ED Course  I have reviewed the triage vital signs and the nursing notes.  Pertinent labs & imaging results that were available during my care of the patient were reviewed by me and considered in my medical decision making (see chart for details).     Will put on steroids and pt to follow up with ortho  Final Clinical Impressions(s) / ED Diagnoses   Final diagnoses:  None    ED Discharge Orders    None       Teressa Lowerickering, Aric Jost, NP 03/01/18 [id number]    Donnetta Hutchingook, Brian, MD 03/03/18 1032

## 2018-03-01 NOTE — ED Triage Notes (Signed)
Pt reports left shoulder pain for a "couple of months now" States that he has been evaluated for same but unclear on results or follow up.

## 2018-05-23 ENCOUNTER — Other Ambulatory Visit: Payer: Self-pay | Admitting: Family Medicine

## 2018-07-28 ENCOUNTER — Other Ambulatory Visit: Payer: Self-pay | Admitting: Orthopedic Surgery

## 2018-07-28 DIAGNOSIS — M25512 Pain in left shoulder: Secondary | ICD-10-CM

## 2018-08-05 ENCOUNTER — Ambulatory Visit
Admission: RE | Admit: 2018-08-05 | Discharge: 2018-08-05 | Disposition: A | Discharge status: Home / Self Care | Source: Ambulatory Visit | Attending: Orthopedic Surgery | Admitting: Orthopedic Surgery

## 2018-08-05 DIAGNOSIS — M25512 Pain in left shoulder: Secondary | ICD-10-CM

## 2018-08-18 ENCOUNTER — Telehealth: Payer: Self-pay | Admitting: *Deleted

## 2018-08-18 NOTE — Telephone Encounter (Signed)
Received Medical/Surgical Clearance Form from [facility];  forwarded to provider/SLS 11/27

## 2018-08-24 ENCOUNTER — Telehealth: Payer: Self-pay

## 2018-08-24 NOTE — Telephone Encounter (Signed)
Needs surgical clearance w/ Dr. Patsy Lageropland- please schedule at his convenience.

## 2018-08-24 NOTE — Telephone Encounter (Signed)
Called pt but pt does not have voicemail set up will need to call back later on today.

## 2018-08-24 NOTE — Telephone Encounter (Signed)
-----   Message from Pearline CablesJessica C Copland, MD sent at 08/21/2018 10:35 AM EST ----- Hi- I need to do a pre-op clearance for him.  Please call and schedule him for a visit Thank you!

## 2018-08-25 NOTE — Telephone Encounter (Signed)
Ok, keep trying. I will also put a letter in the mail to him asking him to schedule a visit

## 2018-08-25 NOTE — Telephone Encounter (Signed)
Called again today still no way to leave voicemail.

## 2018-08-29 NOTE — Progress Notes (Addendum)
[facility] [address], [address] [place], [place] [address] [phone]  Date:  09/01/2018   Name:  Alejandro Beltran   DOB:  02/26/1966   MRN:  [id number]  PCP:  Pearline Cables, MD    Chief Complaint: Surgical Clearance   History of Present Illness:  Alejandro Beltran is a 52 y.o. very pleasant male patient who presents with the following:  Surgical clearance today History of right eye injury 2011 and glaucoma, pre-diabetes  He has noted left shoulder pain for about 6 months, but it got worse over the summer, he had an MRI and he will have a repair of his LEFT RCT this January He is looking forward to having full function of the shoulder again.  No issues with CP or SOB while exercising He is doing indoor cycling class right now as his shoulder tolerates it well  He is able to exercise quite vigorously without any symptoms.  No syncope  He has no history of cardiac disease.  BP Readings from Last 3 Encounters:  09/01/18 112/80  03/01/18 117/89  01/11/18 128/80   Pulse Readings from Last 3 Encounters:  09/01/18 (!) 50  03/01/18 64  01/11/18 (!) 58    Lab Results  Component Value Date   HGBA1C 6.1 01/11/2018   Needs an EKG for preoperative evaluation.  Patient Active Problem List   Diagnosis Date Noted  . Left shoulder pain 12/13/2017  . Pre-diabetes 01/06/2017  . Snoring 04/02/2016  . Osteoarthritis of left knee 12/22/2014  . Peanut allergy   . Acute meniscal tear of knee   . Glaucoma   . Acute medial meniscal tear   . Right eye injury 11/11/2012    Past Medical History:  Diagnosis Date  . Acute medial meniscal tear    left  . Acute meniscal tear of knee   . Allergy   . Glaucoma   . Peanut allergy     Past Surgical History:  Procedure Laterality Date  . EYE SURGERY     rt retina sx  . MENISCUS REPAIR  08/2015    Social History   Tobacco Use  . Smoking status: Passive Smoke Exposure  - Never Smoker  . Smokeless tobacco: Never Used  Substance Use Topics  . Alcohol use: Yes    Alcohol/week: 2.0 standard drinks    Types: 2 Standard drinks or equivalent per week    Comment: socially  . Drug use: No    Family History  Problem Relation Age of Onset  . Hypertension Mother   . Anuerysm Father   . Diabetes Maternal Grandmother   . Stroke Maternal Grandmother   . Cancer Maternal Grandfather   . Colon cancer Neg Hx     Allergies  Allergen Reactions  . Other Anaphylaxis    "nuts" - pt does not recall what kind, he can eat peanuts and peanut butter  . Peanuts [Peanut Oil] Anaphylaxis    Exotic nuts    Medication list has been reviewed and updated.  Current Outpatient Medications on File Prior to Visit  Medication Sig Dispense Refill  . brimonidine-timolol (COMBIGAN) 0.2-0.5 % ophthalmic solution Place 1 drop into both eyes every 12 (twelve) hours.    . Multiple Vitamin (MULTIVITAMIN) tablet Take 1 tablet by mouth daily.    . sildenafil (VIAGRA) 100 MG tablet TAKE ONE-HALF (1/2) TO ONE TABLET DAILY AS NEEDED FOR ERECTILE DYSFUNCTION 30 tablet 2  .  traMADol (ULTRAM) 50 MG tablet Take 1 tablet (50 mg total) by mouth every 6 (six) hours as needed. 20 tablet 0  . EPINEPHrine (EPIPEN 2-PAK) 0.3 mg/0.3 mL IJ SOAJ injection Inject 0.3 mLs (0.3 mg total) into the muscle once as needed. (Patient not taking: Reported on 09/01/2018) 2 Device 2   No current facility-administered medications on file prior to visit.     Review of Systems:  As per HPI- otherwise negative.   Physical Examination: Vitals:   09/01/18 1531  BP: 112/80  Pulse: (!) 50  Resp: 16  Temp: 98 F (36.7 C)  SpO2: 98%   Vitals:   09/01/18 1531  Weight: 167 lb (75.8 kg)  Height: 5\' 8"  (1.727 m)   Body mass index is 25.39 kg/m. Ideal Body Weight: Weight in (lb) to have BMI = 25: 164.1  GEN: WDWN, NAD, Non-toxic, A & O x 3. Looks well, normal weight HEENT: Atraumatic, Normocephalic. Neck  supple. No masses, No LAD.  Chronic right eye injury Ears and Nose: No external deformity. CV: RRR, No M/G/R. No JVD. No thrill. No extra heart sounds. PULM: CTA B, no wheezes, crackles, rhonchi. No retractions. No resp. distress. No accessory muscle use. ABD: S, NT, ND, +BS. No rebound. No HSM. EXTR: No c/c/e NEURO Normal gait.  PSYCH: Normally interactive. Conversant. Not depressed or anxious appearing.  Calm demeanor.   Screening EKG performed today.  Machine reads as possible ischemia.  Called and discussed with Dr. of the day of cardiology, who feels this pattern is most consistent with benign early repolarization.  This is also my impression.  EKG: sinus brady with early repol pattern   Assessment and Plan: Pre-operative clearance - Plan: EKG 12-Lead, CBC, Comprehensive metabolic panel  Here today for preoperative assessment.  Alejandro Beltran is generally in excellent health.  He is going to have a shoulder scope to repair a torn rotator cuff.  Obtain EKG today, which machine does read as ST abnormality.  However have discussed with cardiology who feels this is consistent with early repol pattern.  Patient is very physically active, going to spinning class several times a week.  He never has any chest pain or shortness of breath with activity.  Assuming labs are okay, plan to clear him for surgery. Signed Abbe AmsterdamJessica Mylz Yuan, MD  Received his labs 12/12- letter to pt Will clear for operation  Results for orders placed or performed in visit on 09/01/18  CBC  Result Value Ref Range   WBC 6.0 4.0 - 10.5 K/uL   RBC 4.80 4.22 - 5.81 Mil/uL   Platelets 197.0 150.0 - 400.0 K/uL   Hemoglobin 14.5 13.0 - 17.0 g/dL   HCT 16.143.6 09.639.0 - 04.552.0 %   MCV 90.9 78.0 - 100.0 fl   MCHC 33.3 30.0 - 36.0 g/dL   RDW 40.913.1 81.111.5 - 91.415.5 %  Comprehensive metabolic panel  Result Value Ref Range   Sodium 140 135 - 145 mEq/L   Potassium 4.3 3.5 - 5.1 mEq/L   Chloride 103 96 - 112 mEq/L   CO2 33 (H) 19 - 32 mEq/L    Glucose, Bld 90 70 - 99 mg/dL   BUN 19 6 - 23 mg/dL   Creatinine, Ser 7.821.17 0.40 - 1.50 mg/dL   Total Bilirubin 0.3 0.2 - 1.2 mg/dL   Alkaline Phosphatase 52 39 - 117 U/L   AST 25 0 - 37 U/L   ALT 23 0 - 53 U/L   Total Protein 6.7 6.0 -  8.3 g/dL   Albumin 4.3 3.5 - 5.2 g/dL   Calcium 9.4 8.4 - 37.6 mg/dL   GFR 28.31 >51.76 mL/min

## 2018-09-01 ENCOUNTER — Telehealth: Payer: Self-pay | Admitting: Cardiovascular Disease

## 2018-09-01 ENCOUNTER — Encounter: Payer: Self-pay | Admitting: Family Medicine

## 2018-09-01 ENCOUNTER — Ambulatory Visit (INDEPENDENT_AMBULATORY_CARE_PROVIDER_SITE_OTHER): Admitting: Family Medicine

## 2018-09-01 VITALS — BP 112/80 | HR 50 | Temp 98.0°F | Resp 16 | Ht 68.0 in | Wt 167.0 lb

## 2018-09-01 DIAGNOSIS — Z01818 Encounter for other preprocedural examination: Secondary | ICD-10-CM

## 2018-09-02 LAB — CBC
HCT: 43.6 % (ref 39.0–52.0)
Hemoglobin: 14.5 g/dL (ref 13.0–17.0)
MCHC: 33.3 g/dL (ref 30.0–36.0)
MCV: 90.9 fl (ref 78.0–100.0)
Platelets: 197 10*3/uL (ref 150.0–400.0)
RBC: 4.8 Mil/uL (ref 4.22–5.81)
RDW: 13.1 % (ref 11.5–15.5)
WBC: 6 10*3/uL (ref 4.0–10.5)

## 2018-09-02 LAB — COMPREHENSIVE METABOLIC PANEL
ALBUMIN: 4.3 g/dL (ref 3.5–5.2)
ALT: 23 U/L (ref 0–53)
AST: 25 U/L (ref 0–37)
Alkaline Phosphatase: 52 U/L (ref 39–117)
BUN: 19 mg/dL (ref 6–23)
CHLORIDE: 103 meq/L (ref 96–112)
CO2: 33 mEq/L — ABNORMAL HIGH (ref 19–32)
Calcium: 9.4 mg/dL (ref 8.4–10.5)
Creatinine, Ser: 1.17 mg/dL (ref 0.40–1.50)
GFR: 84.06 mL/min (ref >60.00)
GLUCOSE: 90 mg/dL (ref 70–99)
POTASSIUM: 4.3 meq/L (ref 3.5–5.1)
SODIUM: 140 meq/L (ref 135–145)
Total Bilirubin: 0.3 mg/dL (ref 0.2–1.2)
Total Protein: 6.7 g/dL (ref 6.0–8.3)

## 2019-05-16 NOTE — Progress Notes (Addendum)
[facility] [place], [place], [place] [address] [phone]  Date:  05/18/2019   Name:  Alejandro Beltran   DOB:  1966/06/02   MRN:  [id number]  PCP:  Darreld Mclean, MD    Chief Complaint: Annual Exam (flu shot)   History of Present Illness:  Alejandro Beltran is a 53 y.o. very pleasant male patient who presents with the following:  Here today for a physical exam History of right eye injury 2011, arthritis, prediabetes, glaucoma Last seen by myself in December prior to a rotator cuff repair operation  He finished PT They also plan to do a partial knee in October He feels that he is doing ok emotionally from the covid pandemic His job is ok - mostly working from home now due to pandemic  He enjoys indoor cycling for exercise, but this is canceled during the pandemic Married to [place]- she is having a hysterectomy this fall   He sees Dr. Franchot Mimes with [facility] regarding his glaucoma- he is a bit behind on his follow-up but plans to be seen soon   Flu shot due- done  Shingrix Can do labs today- fasting this am   No CP or SOB   He requests routine STI BW today, no risky behaviors per her report   Lab Results  Component Value Date   PSA 0.52 05/18/2019   PSA 0.56 01/11/2018   PSA 0.52 01/05/2017     Wt Readings from Last 3 Encounters:  05/18/19 163 lb 3.2 oz (74 kg)  09/01/18 167 lb (75.8 kg)  03/01/18 162 lb (73.5 kg)     Patient Active Problem List   Diagnosis Date Noted  . Left shoulder pain 12/13/2017  . Pre-diabetes 01/06/2017  . Snoring 04/02/2016  . Osteoarthritis of left knee 12/22/2014  . Peanut allergy   . Acute meniscal tear of knee   . Glaucoma   . Acute medial meniscal tear   . Right eye injury 11/11/2012    Past Medical History:  Diagnosis Date  . Acute medial meniscal tear    left  . Acute meniscal tear of knee   . Allergy   . Glaucoma   . Peanut allergy     Past  Surgical History:  Procedure Laterality Date  . EYE SURGERY     rt retina sx  . MENISCUS REPAIR  08/2015    Social History   Tobacco Use  . Smoking status: Passive Smoke Exposure - Never Smoker  . Smokeless tobacco: Never Used  Substance Use Topics  . Alcohol use: Yes    Alcohol/week: 2.0 standard drinks    Types: 2 Standard drinks or equivalent per week    Comment: socially  . Drug use: No    Family History  Problem Relation Age of Onset  . Hypertension Mother   . Anuerysm Father   . Diabetes Maternal Grandmother   . Stroke Maternal Grandmother   . Cancer Maternal Grandfather   . Colon cancer Neg Hx     Allergies  Allergen Reactions  . Other Anaphylaxis    "nuts" - pt does not recall what kind, he can eat peanuts and peanut butter  . Peanuts [Peanut Oil] Anaphylaxis    Exotic nuts    Medication list has been reviewed and updated.  Current Outpatient Medications on File Prior to Visit  Medication Sig Dispense Refill  . brimonidine-timolol (COMBIGAN) 0.2-0.5 % ophthalmic solution Place  1 drop into both eyes every 12 (twelve) hours.    Marland Kitchen. EPINEPHrine (EPIPEN 2-PAK) 0.3 mg/0.3 mL IJ SOAJ injection Inject 0.3 mLs (0.3 mg total) into the muscle once as needed. 2 Device 2  . Multiple Vitamin (MULTIVITAMIN) tablet Take 1 tablet by mouth daily.    . sildenafil (VIAGRA) 100 MG tablet TAKE ONE-HALF (1/2) TO ONE TABLET DAILY AS NEEDED FOR ERECTILE DYSFUNCTION 30 tablet 2  . ibuprofen (ADVIL) 800 MG tablet     . penicillin v potassium (VEETID) 500 MG tablet      No current facility-administered medications on file prior to visit.     Review of Systems:  As per HPI- otherwise negative.   Physical Examination: Vitals:   05/18/19 0909  BP: 124/80  Pulse: 66  Resp: 16  Temp: (!) 97.1 F (36.2 C)  SpO2: 98%   Vitals:   05/18/19 0909  Weight: 163 lb 3.2 oz (74 kg)   Body mass index is 24.81 kg/m. Ideal Body Weight:    GEN: WDWN, NAD, Non-toxic, A & O x 3, looks  well, normal weight HEENT: Atraumatic, Normocephalic. Neck supple. No masses, No LAD.  Right eye is at baseline for pt  TM wnl  Ears and Nose: No external deformity. CV: RRR, No M/G/R. No JVD. No thrill. No extra heart sounds. PULM: CTA B, no wheezes, crackles, rhonchi. No retractions. No resp. distress. No accessory muscle use. ABD: S, NT, ND, +BS. No rebound. No HSM. EXTR: No c/c/e NEURO Normal gait.  PSYCH: Normally interactive. Conversant. Not depressed or anxious appearing.  Calm demeanor.    Assessment and Plan:   ICD-10-CM   1. Physical exam  Z00.00   2. Screening for prostate cancer  Z12.5 PSA  3. Screening for deficiency anemia  Z13.0 CBC  4. Screening for diabetes mellitus  Z13.1 Comprehensive metabolic panel    Hemoglobin A1c  5. Glaucoma of both eyes, unspecified glaucoma type  H40.9   6. Screening for hyperlipidemia  Z13.220 Lipid panel  7. Vitamin D deficiency  E55.9 Vitamin D (25 hydroxy)  8. Immunization due  Z23 Varicella-zoster vaccine IM (Shingrix)  9. Routine screening for STI (sexually transmitted infection)  Z11.3 Hepatitis C antibody    HIV Antibody (routine testing w rflx)    RPR    Hepatitis B surface antigen  10. Needs flu shot  Z23 Flu Vaccine QUAD 6+ mos PF IM (Fluarix Quad PF)   Here today for a CPE Labs pending as above Will plan further follow- up pending labs. Flu shot given today, shingrix #1  Follow-up: No follow-ups on file.  No orders of the defined types were placed in this encounter.  Orders Placed This Encounter  Procedures  . Varicella-zoster vaccine IM (Shingrix)  . Flu Vaccine QUAD 6+ mos PF IM (Fluarix Quad PF)  . CBC  . Comprehensive metabolic panel  . Hemoglobin A1c  . Lipid panel  . PSA  . Vitamin D (25 hydroxy)  . Hepatitis C antibody  . HIV Antibody (routine testing w rflx)  . RPR  . Hepatitis B surface antigen       Signed Abbe AmsterdamJessica , MD  Received his labs so far, await the rest of results for lab  letter Received all of his labs, 8/27, lab letter to patient  Results for orders placed or performed in visit on 05/18/19  CBC  Result Value Ref Range   WBC 4.8 4.0 - 10.5 K/uL   RBC 4.72 4.22 - 5.81 Mil/uL  Platelets 184.0 150.0 - 400.0 K/uL   Hemoglobin 14.2 13.0 - 17.0 g/dL   HCT 16.143.4 09.639.0 - 04.552.0 %   MCV 91.9 78.0 - 100.0 fl   MCHC 32.6 30.0 - 36.0 g/dL   RDW 40.913.7 81.111.5 - 91.415.5 %  Comprehensive metabolic panel  Result Value Ref Range   Sodium 140 135 - 145 mEq/L   Potassium 4.1 3.5 - 5.1 mEq/L   Chloride 104 96 - 112 mEq/L   CO2 30 19 - 32 mEq/L   Glucose, Bld 95 70 - 99 mg/dL   BUN 14 6 - 23 mg/dL   Creatinine, Ser 7.820.96 0.40 - 1.50 mg/dL   Total Bilirubin 0.3 0.2 - 1.2 mg/dL   Alkaline Phosphatase 52 39 - 117 U/L   AST 26 0 - 37 U/L   ALT 28 0 - 53 U/L   Total Protein 6.3 6.0 - 8.3 g/dL   Albumin 4.2 3.5 - 5.2 g/dL   Calcium 9.0 8.4 - 95.610.5 mg/dL   GFR 21.3099.10 >86.57>60.00 mL/min  Hemoglobin A1c  Result Value Ref Range   Hgb A1c MFr Bld 5.9 4.6 - 6.5 %  Lipid panel  Result Value Ref Range   Cholesterol 179 0 - 200 mg/dL   Triglycerides 84.648.0 0.0 - 149.0 mg/dL   HDL 96.2956.90 >52.84>39.00 mg/dL   VLDL 9.6 0.0 - 13.240.0 mg/dL   LDL Cholesterol 440113 (H) 0 - 99 mg/dL   Total CHOL/HDL Ratio 3    NonHDL 122.26   PSA  Result Value Ref Range   PSA 0.52 0.10 - 4.00 ng/mL  Vitamin D (25 hydroxy)  Result Value Ref Range   VITD 50.23 30.00 - 100.00 ng/mL  Hepatitis C antibody  Result Value Ref Range   Hepatitis C Ab NON-REACTIVE NON-REACTI   SIGNAL TO CUT-OFF 0.01 <1.00  HIV Antibody (routine testing w rflx)  Result Value Ref Range   HIV 1&2 Ab, 4th Generation NON-REACTIVE NON-REACTI  RPR  Result Value Ref Range   RPR Ser Ql NON-REACTIVE NON-REACTI  Hepatitis B surface antigen  Result Value Ref Range   Hepatitis B Surface Ag NON-REACTIVE NON-REACTI

## 2019-05-16 NOTE — Patient Instructions (Signed)
It was great to see you today, I will be in touch with your labs as soon as possible You got your flu shot and first shingles vaccine today- 2nd dose of shingles is due in 2-6 months, can be given as a nurse visit only  Take care, let me know if you need anything and good luck with your knee operation    Health Maintenance, Male Adopting a healthy lifestyle and getting preventive care are important in promoting health and wellness. Ask your health care provider about:  The right schedule for you to have regular tests and exams.  Things you can do on your own to prevent diseases and keep yourself healthy. What should I know about diet, weight, and exercise? Eat a healthy diet   Eat a diet that includes plenty of vegetables, fruits, low-fat dairy products, and lean protein.  Do not eat a lot of foods that are high in solid fats, added sugars, or sodium. Maintain a healthy weight Body mass index (BMI) is a measurement that can be used to identify possible weight problems. It estimates body fat based on height and weight. Your health care provider can help determine your BMI and help you achieve or maintain a healthy weight. Get regular exercise Get regular exercise. This is one of the most important things you can do for your health. Most adults should:  Exercise for at least 150 minutes each week. The exercise should increase your heart rate and make you sweat (moderate-intensity exercise).  Do strengthening exercises at least twice a week. This is in addition to the moderate-intensity exercise.  Spend less time sitting. Even light physical activity can be beneficial. Watch cholesterol and blood lipids Have your blood tested for lipids and cholesterol at 52 years of age, then have this test every 5 years. You may need to have your cholesterol levels checked more often if:  Your lipid or cholesterol levels are high.  You are older than 53 years of age.  You are at high risk for  heart disease. What should I know about cancer screening? Many types of cancers can be detected early and may often be prevented. Depending on your health history and family history, you may need to have cancer screening at various ages. This may include screening for:  Colorectal cancer.  Prostate cancer.  Skin cancer.  Lung cancer. What should I know about heart disease, diabetes, and high blood pressure? Blood pressure and heart disease  High blood pressure causes heart disease and increases the risk of stroke. This is more likely to develop in people who have high blood pressure readings, are of African descent, or are overweight.  Talk with your health care provider about your target blood pressure readings.  Have your blood pressure checked: ? Every 3-5 years if you are 60-74 years of age. ? Every year if you are 27 years old or older.  If you are between the ages of 14 and 4 and are a current or former smoker, ask your health care provider if you should have a one-time screening for abdominal aortic aneurysm (AAA). Diabetes Have regular diabetes screenings. This checks your fasting blood sugar level. Have the screening done:  Once every three years after age 7 if you are at a normal weight and have a low risk for diabetes.  More often and at a younger age if you are overweight or have a high risk for diabetes. What should I know about preventing infection? Hepatitis B If you  have a higher risk for hepatitis B, you should be screened for this virus. Talk with your health care provider to find out if you are at risk for hepatitis B infection. Hepatitis C Blood testing is recommended for:  Everyone born from [birth year] through 1965.  Anyone with known risk factors for hepatitis C. Sexually transmitted infections (STIs)  You should be screened each year for STIs, including gonorrhea and chlamydia, if: ? You are sexually active and are younger than 53 years of age. ? You are  older than 53 years of age and your health care provider tells you that you are at risk for this type of infection. ? Your sexual activity has changed since you were last screened, and you are at increased risk for chlamydia or gonorrhea. Ask your health care provider if you are at risk.  Ask your health care provider about whether you are at high risk for HIV. Your health care provider may recommend a prescription medicine to help prevent HIV infection. If you choose to take medicine to prevent HIV, you should first get tested for HIV. You should then be tested every 3 months for as long as you are taking the medicine. Follow these instructions at home: Lifestyle  Do not use any products that contain nicotine or tobacco, such as cigarettes, e-cigarettes, and chewing tobacco. If you need help quitting, ask your health care provider.  Do not use street drugs.  Do not share needles.  Ask your health care provider for help if you need support or information about quitting drugs. Alcohol use  Do not drink alcohol if your health care provider tells you not to drink.  If you drink alcohol: ? Limit how much you have to 0-2 drinks a day. ? Be aware of how much alcohol is in your drink. In the [place] one drink equals one 12 oz bottle of beer (355 mL), one 5 oz glass of wine (148 mL), or one 1 oz glass of hard liquor (44 mL). General instructions  Schedule regular health, dental, and eye exams.  Stay current with your vaccines.  Tell your health care provider if: ? You often feel depressed. ? You have ever been abused or do not feel safe at home. Summary  Adopting a healthy lifestyle and getting preventive care are important in promoting health and wellness.  Follow your health care provider's instructions about healthy diet, exercising, and getting tested or screened for diseases.  Follow your health care provider's instructions on monitoring your cholesterol and blood pressure. This  information is not intended to replace advice given to you by your health care provider. Make sure you discuss any questions you have with your health care provider. Document Released: 03/06/2008 Document Revised: 09/01/2018 Document Reviewed: 09/01/2018 Elsevier Patient Education  2020 [employer]

## 2019-05-17 ENCOUNTER — Other Ambulatory Visit: Payer: Self-pay

## 2019-05-18 ENCOUNTER — Encounter: Payer: Self-pay | Admitting: Family Medicine

## 2019-05-18 ENCOUNTER — Ambulatory Visit (INDEPENDENT_AMBULATORY_CARE_PROVIDER_SITE_OTHER): Admitting: Family Medicine

## 2019-05-18 VITALS — BP 124/80 | HR 66 | Temp 97.1°F | Resp 16 | Wt 163.2 lb

## 2019-05-18 DIAGNOSIS — Z130 Encounter for screening for diseases of the blood and blood-forming organs and certain disorders involving the immune mechanism: Secondary | ICD-10-CM | POA: Diagnosis not present

## 2019-05-18 DIAGNOSIS — E559 Vitamin D deficiency, unspecified: Secondary | ICD-10-CM

## 2019-05-18 DIAGNOSIS — Z125 Encounter for screening for malignant neoplasm of prostate: Secondary | ICD-10-CM | POA: Diagnosis not present

## 2019-05-18 DIAGNOSIS — Z131 Encounter for screening for diabetes mellitus: Secondary | ICD-10-CM | POA: Diagnosis not present

## 2019-05-18 DIAGNOSIS — Z0000 Encounter for general adult medical examination without abnormal findings: Secondary | ICD-10-CM | POA: Diagnosis not present

## 2019-05-18 DIAGNOSIS — Z13220 Encounter for screening for lipoid disorders: Secondary | ICD-10-CM

## 2019-05-18 DIAGNOSIS — Z113 Encounter for screening for infections with a predominantly sexual mode of transmission: Secondary | ICD-10-CM

## 2019-05-18 DIAGNOSIS — H409 Unspecified glaucoma: Secondary | ICD-10-CM

## 2019-05-18 DIAGNOSIS — Z23 Encounter for immunization: Secondary | ICD-10-CM

## 2019-05-18 LAB — COMPREHENSIVE METABOLIC PANEL
ALT: 28 U/L (ref 0–53)
AST: 26 U/L (ref 0–37)
Albumin: 4.2 g/dL (ref 3.5–5.2)
Alkaline Phosphatase: 52 U/L (ref 39–117)
BUN: 14 mg/dL (ref 6–23)
CO2: 30 mEq/L (ref 19–32)
Calcium: 9 mg/dL (ref 8.4–10.5)
Chloride: 104 mEq/L (ref 96–112)
Creatinine, Ser: 0.96 mg/dL (ref 0.40–1.50)
GFR: 99.1 mL/min (ref >60.00)
Glucose, Bld: 95 mg/dL (ref 70–99)
Potassium: 4.1 mEq/L (ref 3.5–5.1)
Sodium: 140 mEq/L (ref 135–145)
Total Bilirubin: 0.3 mg/dL (ref 0.2–1.2)
Total Protein: 6.3 g/dL (ref 6.0–8.3)

## 2019-05-18 LAB — PSA: PSA: 0.52 ng/mL (ref 0.10–4.00)

## 2019-05-18 LAB — CBC
HCT: 43.4 % (ref 39.0–52.0)
Hemoglobin: 14.2 g/dL (ref 13.0–17.0)
MCHC: 32.6 g/dL (ref 30.0–36.0)
MCV: 91.9 fl (ref 78.0–100.0)
Platelets: 184 10*3/uL (ref 150.0–400.0)
RBC: 4.72 Mil/uL (ref 4.22–5.81)
RDW: 13.7 % (ref 11.5–15.5)
WBC: 4.8 10*3/uL (ref 4.0–10.5)

## 2019-05-18 LAB — LIPID PANEL
Cholesterol: 179 mg/dL (ref 0–200)
HDL: 56.9 mg/dL (ref >39.00)
LDL Cholesterol: 113 mg/dL — ABNORMAL HIGH (ref 0–99)
NonHDL: 122.26
Total CHOL/HDL Ratio: 3
Triglycerides: 48 mg/dL (ref 0.0–149.0)
VLDL: 9.6 mg/dL (ref 0.0–40.0)

## 2019-05-18 LAB — VITAMIN D 25 HYDROXY (VIT D DEFICIENCY, FRACTURES): VITD: 50.23 ng/mL (ref 30.00–100.00)

## 2019-05-18 LAB — HEMOGLOBIN A1C: Hgb A1c MFr Bld: 5.9 % (ref 4.6–6.5)

## 2019-05-19 LAB — RPR: RPR Ser Ql: NONREACTIVE

## 2019-05-19 LAB — HEPATITIS B SURFACE ANTIGEN: Hepatitis B Surface Ag: NONREACTIVE

## 2019-05-19 LAB — HEPATITIS C ANTIBODY
Hepatitis C Ab: NONREACTIVE
SIGNAL TO CUT-OFF: 0.01 (ref <1.00)

## 2019-05-19 LAB — HIV ANTIBODY (ROUTINE TESTING W REFLEX): HIV 1&2 Ab, 4th Generation: NONREACTIVE

## 2019-05-30 ENCOUNTER — Ambulatory Visit (HOSPITAL_COMMUNITY)
Admission: EM | Admit: 2019-05-30 | Discharge: 2019-05-30 | Disposition: A | Discharge status: Home / Self Care | Attending: Family Medicine | Admitting: Family Medicine

## 2019-05-30 ENCOUNTER — Encounter (HOSPITAL_COMMUNITY): Payer: Self-pay

## 2019-05-30 ENCOUNTER — Other Ambulatory Visit: Payer: Self-pay

## 2019-05-30 DIAGNOSIS — H15102 Unspecified episcleritis, left eye: Secondary | ICD-10-CM | POA: Diagnosis not present

## 2019-05-30 DIAGNOSIS — H15002 Unspecified scleritis, left eye: Secondary | ICD-10-CM | POA: Diagnosis not present

## 2019-05-30 MED ORDER — TETRACAINE HCL 0.5 % OP SOLN
OPHTHALMIC | Status: AC
  Filled 2019-05-30: qty 4

## 2019-05-30 MED ORDER — NEOMYCIN-POLYMYXIN-DEXAMETH 3.5-10000-0.1 OP SUSP: 1.0000 [drp] | OPHTHALMIC | 0 refills | Status: AC

## 2019-05-30 MED ORDER — FLUORESCEIN SODIUM 1 MG OP STRP
ORAL_STRIP | OPHTHALMIC | Status: AC
  Filled 2019-05-30: qty 1

## 2019-05-30 NOTE — ED Triage Notes (Signed)
Patient presents to Urgent Care with complaints of left eye pain since two days ago. Patient reports he does not know if there is something in it or if he scratched his cornea. Pt states his vision is normal but the left eye is sensitive to light.

## 2019-05-30 NOTE — ED Provider Notes (Signed)
[facility]    CSN: [id number] Arrival date & time: 05/30/19  1626      History   Chief Complaint Chief Complaint  Patient presents with  . Eye Pain    HPI Alejandro Beltran is a 53 y.o. male.   HPI   Eye pain 2 days Red Photophobia No injury Wears contacts  Past Medical History:  Diagnosis Date  . Acute medial meniscal tear    left  . Acute meniscal tear of knee   . Allergy   . Glaucoma   . Peanut allergy     Patient Active Problem List   Diagnosis Date Noted  . Left shoulder pain 12/13/2017  . Pre-diabetes 01/06/2017  . Snoring 04/02/2016  . Osteoarthritis of left knee 12/22/2014  . Peanut allergy   . Acute meniscal tear of knee   . Glaucoma   . Acute medial meniscal tear   . Right eye injury 11/11/2012    Past Surgical History:  Procedure Laterality Date  . EYE SURGERY     rt retina sx  . MENISCUS REPAIR  08/2015       Home Medications    Prior to Admission medications   Medication Sig Start Date End Date Taking? Authorizing Provider  brimonidine-timolol (COMBIGAN) 0.2-0.5 % ophthalmic solution Place 1 drop into both eyes every 12 (twelve) hours.    [provider]  EPINEPHrine (EPIPEN 2-PAK) 0.3 mg/0.3 mL IJ SOAJ injection Inject 0.3 mLs (0.3 mg total) into the muscle once as needed. 05/14/17   Copland, Gwenlyn Found, MD  ibuprofen (ADVIL) 800 MG tablet  05/16/19   [provider]  Multiple Vitamin (MULTIVITAMIN) tablet Take 1 tablet by mouth daily.    [provider]  neomycin-polymyxin b-dexamethasone (MAXITROL) 3.5-10000-0.1 SUSP Place 1 drop into the left eye every 4 (four) hours for 5 days. 05/30/19 06/04/19  Eustace Moore, MD  penicillin v potassium (VEETID) 500 MG tablet  05/16/19   [provider]  sildenafil (VIAGRA) 100 MG tablet TAKE ONE-HALF (1/2) TO ONE TABLET DAILY AS NEEDED FOR ERECTILE DYSFUNCTION 05/25/18   Copland, Gwenlyn Found, MD    Family History Family History  Problem Relation Age  of Onset  . Hypertension Mother   . Anuerysm Father   . Diabetes Maternal Grandmother   . Stroke Maternal Grandmother   . Cancer Maternal Grandfather   . Colon cancer Neg Hx     Social History Social History   Tobacco Use  . Smoking status: Passive Smoke Exposure - Never Smoker  . Smokeless tobacco: Never Used  Substance Use Topics  . Alcohol use: Not Currently    Alcohol/week: 2.0 standard drinks    Types: 2 Standard drinks or equivalent per week    Comment: socially  . Drug use: No     Allergies   Other and Peanuts [peanut oil]   Review of Systems Review of Systems  Constitutional: Negative for chills and fever.  HENT: Negative for ear pain and sore throat.   Eyes: Positive for photophobia and redness. Negative for pain and visual disturbance.  Respiratory: Negative for cough and shortness of breath.   Cardiovascular: Negative for chest pain and palpitations.  Gastrointestinal: Negative for abdominal pain and vomiting.  Genitourinary: Negative for dysuria and hematuria.  Musculoskeletal: Negative for arthralgias and back pain.  Skin: Negative for color change and rash.  Neurological: Negative for seizures and syncope.  All other systems reviewed and are negative.    Physical Exam Triage Vital  Signs ED Triage Vitals  Enc Vitals Group     BP 05/30/19 1737 133/89     Pulse Rate 05/30/19 1737 (!) 57     Resp 05/30/19 1737 18     Temp 05/30/19 1737 98.2 F (36.8 C)     Temp Source 05/30/19 1737 Temporal     SpO2 05/30/19 1737 98 %     Weight --      Height --      Head Circumference --      Peak Flow --      Pain Score 05/30/19 1736 8     Pain Loc --      Pain Edu? --      Excl. in GC? --    No data found.  Updated Vital Signs BP 133/89 (BP Location: Right Arm)   Pulse (!) 57   Temp 98.2 F (36.8 C) (Temporal)   Resp 18   SpO2 98%       Physical Exam Constitutional:      General: He is not in acute distress.    Appearance: He is  well-developed.  HENT:     Head: Normocephalic and atraumatic.  Eyes:     General: Lids are normal. Lids are everted, no foreign bodies appreciated. No visual field deficit.       Right eye: No foreign body.        Left eye: No foreign body.     Conjunctiva/sclera:     Right eye: Right conjunctiva is not injected.     Left eye: Left conjunctiva is injected.     Pupils: Pupils are equal, round, and reactive to light.     Right eye: No corneal abrasion.     Left eye: Fluorescein uptake present.   Neck:     Musculoskeletal: Normal range of motion.  Cardiovascular:     Rate and Rhythm: Normal rate.  Pulmonary:     Effort: Pulmonary effort is normal. No respiratory distress.  Abdominal:     General: There is no distension.     Palpations: Abdomen is soft.  Musculoskeletal: Normal range of motion.  Skin:    General: Skin is warm and dry.  Neurological:     Mental Status: He is alert.      UC Treatments / Results  Labs (all labs ordered are listed, but only abnormal results are displayed) Labs Reviewed - No data to display  EKG   Radiology No results found.  Procedures Procedures (including critical care time)  Medications Ordered in UC Medications  tetracaine (PONTOCAINE) 0.5 % ophthalmic solution (has no administration in time range)  fluorescein 1 MG ophthalmic strip (has no administration in time range)    Initial Impression / Assessment and Plan / UC Course  I have reviewed the triage vital signs and the nursing notes.  Pertinent labs & imaging results that were available during my care of the patient were reviewed by me and considered in my medical decision making (see chart for details).      Final Clinical Impressions(s) / UC Diagnoses   Final diagnoses:  Scleritis and episcleritis, left     Discharge Instructions     Use eyedrops as directed Call your eye doctor tomorrow.  If not improving you will need to be seen   ED Prescriptions     Medication Sig Dispense Auth. Provider   neomycin-polymyxin b-dexamethasone (MAXITROL) 3.5-10000-0.1 SUSP Place 1 drop into the left eye every 4 (four) hours for 5 days. 5  mL Raylene Everts, MD     Controlled Substance Prescriptions [place] Controlled Substance Registry consulted? Not Applicable   Raylene Everts, MD 05/30/19 2145

## 2019-05-30 NOTE — Discharge Instructions (Addendum)
Use eyedrops as directed Call your eye doctor tomorrow.  If not improving you will need to be seen

## 2019-06-06 ENCOUNTER — Telehealth: Payer: Self-pay | Admitting: Family Medicine

## 2019-06-06 NOTE — Telephone Encounter (Signed)
Patient was seen 05/18/2019 for his physical and would like to know what vaccinations he received at the time of visit, please advise

## 2019-06-07 NOTE — Telephone Encounter (Signed)
Tried calling, no answer voicemail box has not been set up yet.

## 2019-06-16 ENCOUNTER — Telehealth: Payer: Self-pay | Admitting: *Deleted

## 2019-06-16 NOTE — Telephone Encounter (Signed)
Alejandro Beltran will you be able to call patient.  He just had a cpe on 05/18/19.  Maybe Dr. Lorelei Pont can just fill out form for him?  Can you check with her.  If you are unable to call patient please check will Dr. Lorelei Pont and send back to me.

## 2019-06-16 NOTE — Telephone Encounter (Signed)
  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 Caller declined triage but left name and number for callback.

## 2019-06-16 NOTE — Telephone Encounter (Signed)
Provider ok with filling out form based off of physical. Patient is going to drop off form for [place] to fill out.

## 2019-07-13 ENCOUNTER — Telehealth: Payer: Self-pay | Admitting: Family Medicine

## 2019-07-13 NOTE — Telephone Encounter (Signed)
Placed forms in providers basket for review and signing.

## 2019-07-13 NOTE — Telephone Encounter (Signed)
Called pt to go over forms Negative to all TB screening questions, cleared for foster parent - form signed  He is not able to exercise as much as in the past due to his knee pain- he is scheduled for a total knee soon. Due to knee pain he is not able to indoor cycle which had been his normal exercise He is able to do 4 mets of activity such as climbing stairs or housework without any CP or SOB  Recnet vitals and labs at CPE in August ok   He underwent a rotator cuff repair earlier this year without complication - I did a pre-op clearance visit for him 09/01/2018.  We discussed his EKG with cardiology at that time, consistent with early repolarization Will clear for planned knee operation as well   Lab Results  Component Value Date   HGBA1C 5.9 05/18/2019

## 2019-07-13 NOTE — Telephone Encounter (Signed)
Pt dropped off 2 forms (SS form for foster parenting & Pre Operative form). Pt requesting SS form as soon as able & the other 5-7 days is fine. In tray for Dr. Lorelei Pont at front.

## 2019-08-18 ENCOUNTER — Other Ambulatory Visit: Payer: Self-pay | Admitting: Family Medicine

## 2019-08-22 NOTE — Telephone Encounter (Signed)
RF request for Viagra  LOV: CPE 05/18/2019 Next ov: Not scheduled  Last written: 05/25/2018 #30 x2 refills    Please advise

## 2019-09-14 ENCOUNTER — Ambulatory Visit: Attending: Nurse Practitioner

## 2019-09-14 ENCOUNTER — Other Ambulatory Visit: Payer: Self-pay

## 2019-09-14 DIAGNOSIS — G8929 Other chronic pain: Secondary | ICD-10-CM | POA: Insufficient documentation

## 2019-09-14 DIAGNOSIS — M25562 Pain in left knee: Secondary | ICD-10-CM | POA: Diagnosis present

## 2019-09-14 DIAGNOSIS — M6281 Muscle weakness (generalized): Secondary | ICD-10-CM | POA: Diagnosis present

## 2019-09-14 NOTE — Therapy (Signed)
[facility] MAIN [facility] SERVICES [address] [place], [place], [address] Phone: [phone]   Fax:  [phone]  Physical Therapy Evaluation  Patient Details  Name: Alejandro Beltran MRN: [id number] Date of Birth: [birth year] Referring Provider (PT): Dr. Harrison Mons   Encounter Date: 09/14/2019  PT End of Session - 09/14/19 1700    Visit Number  1    Number of Visits  36    Date for PT Re-Evaluation  12/07/19    Authorization Type  Eval: 09/14/19    PT Start Time  1120    PT Stop Time  1210    PT Time Calculation (min)  50 min    Activity Tolerance  Patient tolerated treatment well    Behavior During Therapy  [facility] for tasks assessed/performed       Past Medical History:  Diagnosis Date  . Acute medial meniscal tear    left  . Acute meniscal tear of knee   . Allergy   . Glaucoma   . Peanut allergy     Past Surgical History:  Procedure Laterality Date  . EYE SURGERY     rt retina sx  . MENISCUS REPAIR  08/2015    There were no vitals filed for this visit.   Subjective Assessment - 09/14/19 1655    Subjective  L TKR    Pertinent History  Pt underwent L TKR 08/15/19. He started OP PT 2 days after surgery at [facility] in [place] however due to the distance of the drive he transferred his care to [facility]. Pt reports that his surgeon would like for him to get therapy 3 days/week and they were only able to schedule him for two days per week so he is transferring his care to [facility]. Pt has been performing his HEP at home which includes static L knee flexion and extension stretches as well as additional LLE strengthening. He continues to utilize his Polar Care as well. Pt had a meniscal repair of L knee in 2014 as well as a meniscal repair to his R knee in 2016. Prior to surgery he reports pain but fulll L knee range of motion. Other than his surgery pt denies any other recent changes to his health.    Limitations  Walking    Patient Stated Goals  Improve L knee range of motion and strength as well as decrease pain    Currently in Pain?  Yes    Pain Score  4    Worst: 8/10, Best: 4/10   Pain Location  Knee    Pain Orientation  Left    Pain Descriptors / Indicators  Aching    Pain Radiating Towards  None    Pain Onset  1 to 4 weeks ago    Pain Frequency  Constant    Aggravating Factors   exercises and movement    Pain Relieving Factors  ice, Percocet    Multiple Pain Sites  No   Pt does report some L hip pain over the last couple days         SUBJECTIVE Onset: Pt underwent L TKR 08/15/19. He started OP PT 2 days after surgery at [facility] in [place] however due to the distance of the drive he transferred his care to [facility]. Pt reports that his surgeon would like for him to get therapy 3 days/week and they were only able to schedule him for two days per week so he is transferring  his care to [facility]. Pt has been performing his HEP at home which includes static L knee flexion and extension stretches as well as additional LLE strengthening. He continues to utilize his Polar Care as well. Pt had a meniscal repair of L knee in 2014 as well as a meniscal repair to his R knee in 2016. Prior to surgery he reports pain but fulll L knee range of motion. Other than his surgery pt denies any other recent changes to his health. Referring Dx: Elbert EwingsL TKR MD: Dr. Alycia Rossettiyan Follow-up appt with MD: 09/20/19 Pain: 4/10 Present, 4/10 Best, 8/10 Worst: Aggravating factors: home exercises Easing factors: ice (Polar Care), Percocet  24 hour pain behavior: Pain is worst in the AM and at the end of the day Pain quality: pain quality: aching Radiating symptoms: No  Numbness/Tingling: No Dominant hand: right    OBJECTIVE  MUSCULOSKELETAL: Tremor: Absent Bulk: Atrophy noted to L quad and L calf Mild warmth and swelling noted around L knee  Posture No gross abnormalities noted in standing or seated posture  with the exception of some mild forward head  Hip Screening: range of motion bilaterally WFL and painless with overpressure in all planes  Gait Ambulates with a single underarm crutch on R side and antalgic gait with deficits related to limited L knee flexion and extension  Palpation Pt with dressing over anterior knee covering incision. Mild pain to palpation along medial and lateral joint line of knee as well as hamstrings and calf. Calf is soft and easily compressible. Mild pain with palpation to medial L ankle just above medial malleolus.   Strength R/L 4+/4+ Hip flexion 5/5 Hip external rotation 5/5 Hip internal rotation 4+/4 Hip abduction 4/4- Hip adduction 5/>4/5* Knee extension 5/5 Knee flexion 5/5 Ankle Dorsiflexion *indicates pain  AROM Knee R/L Flexion: 139/77  Extension: 3 degrees hyperextension/-14 degrees  Hip R/L Flexion: WNL Extension: Abduction: WNL Adduction: WNL Internal Rotation: Mild limitation on R side, L side WNL External Rotation: WNL  Ankle: Grossly WNL and painless bilateral;  VASCULAR L dorsalis pedis and posterior tibial pulses are palpable    [facility] PT Assessment - 09/14/19 1657      Assessment   Medical Diagnosis  L TKR    Referring Provider (PT)  Dr. Harrison Monsavid Rion    Onset Date/Surgical Date  08/15/19    Hand Dominance  Right    Next MD Visit  09/20/19    Prior Therapy  Yes, see history. [facility]      Precautions   Precautions  Knee      Restrictions   Weight Bearing Restrictions  No      Balance Screen   Has the patient fallen in the past 6 months  No    Has the patient had a decrease in activity level because of a fear of falling?   Yes    Is the patient reluctant to leave their home because of a fear of falling?   No      Home Environment   Living Environment  Private residence    Living Arrangements  Spouse/significant other    Available Help at Discharge   Family      Prior Function   Level of Independence  Independent      Cognition   Overall Cognitive Status  Within Functional Limits for tasks assessed         Pt issued HEP. Demonstration and explanation about how to  perform correctly and safely.        Objective measurements completed on examination: See above findings.              PT Education - 09/14/19 1700    Education Details  Plan of care and HEP    Person(s) Educated  Patient    Methods  Explanation;Handout;Demonstration    Comprehension  Verbalized understanding       PT Short Term Goals - 09/14/19 1706      PT SHORT TERM GOAL #1   Title  Pt will be independent with HEP in order to decrease knee pain and increase strength in order to improve pain-free function.    Time  6    Period  Weeks    Status  New    Target Date  10/26/19        PT Long Term Goals - 09/14/19 1707      PT LONG TERM GOAL #1   Title  Pt will increase LEFS by at least 9 points in order to demonstrate significant improvement in lower extremity function.    Baseline  09/14/19: to be completed at next appointment    Time  12    Period  Weeks    Status  New    Target Date  12/07/19      PT LONG TERM GOAL #2   Title  Pt will decrease worst pain as reported on NPRS by at least 3 points in order to demonstrate clinically significant reduction in ankle/foot pain.    Baseline  09/14/19: worst: 8/10    Time  12    Period  Weeks    Status  New    Target Date  12/07/19      PT LONG TERM GOAL #3   Title  Pt will increase strength of L hip abduction and adduction by at least 1/2 MMT grade in order to demonstrate improvement in strength and function    Baseline  09/14/19: abduction: 4/5, adduction: 4-/5    Time  12    Period  Weeks    Status  New    Target Date  12/07/19      PT LONG TERM GOAL #4   Title  Pt will improve L knee range of motion to within 5 degrees of neutral extension and greater than 115 degrees of flexion  in order to normalize gait and stair ascend/descend    Baseline  09/13/29: lacking 14 degrees extension, 77 degrees flexion    Time  12    Period  Weeks    Status  New    Target Date  12/07/19             Plan - 09/14/19 1701    Clinical Impression Statement  Pt is a pleasant 53 year-old male referred for physical therapy s/p L TKR 08/15/19. PT examination reveals significant atrophy to L quad and L calf. Mild swelling and warmth around R knee with pain to palpation along medial and lateral joint line of L knee as well as hamstrings and calf. Calf is soft and easily compressible. Mild pain with palpation to medial L ankle just above medial malleolus. Dorsalis pedis and posterior tibial pulses are easily palpable. Unable to visualize incision under dressing. He has some mild weakness in L hip abduction and adduction. The most notable deficits is limited L knee active assisted range of motion. He is currently lacking 14 degrees of extension and is able to flex L knee  to 77 degrees. He will benefit from PT services to address deficits in strength, mobility, and pain in order to return to full function at home with less knee pain.    Personal Factors and Comorbidities  Comorbidity 1;Past/Current Experience    Comorbidities  R knee meniscal repair    Examination-Activity Limitations  Carry;Caring for Others;Locomotion Level;Stairs;Squat    Examination-Participation Restrictions  Community Activity;Yard Work    Stability/Clinical Decision Making  Stable/Uncomplicated    Clinical Decision Making  Moderate    Rehab Potential  Good    PT Frequency  3x / week    PT Duration  12 weeks    PT Treatment/Interventions  ADLs/Self Care Home Management;Aquatic Therapy;Biofeedback;Canalith Repostioning;Cryotherapy;Electrical Stimulation;Iontophoresis 4mg /ml Dexamethasone;Moist Heat;Traction;Ultrasound;Gait training;DME Instruction;Stair training;Therapeutic exercise;Therapeutic activities;Balance  training;Neuromuscular re-education;Patient/family education;Manual techniques;Passive range of motion;Scar mobilization;Dry needling;Vestibular;Joint Manipulations    PT Next Visit Plan  Have pt complete LEFS, perform TUG, 34m gait speed, 5TSTS and update goals    PT Home Exercise Plan  Medbridge Access Code: [email or web address]    Consulted and Agree with Plan of Care  Patient       Patient will benefit from skilled therapeutic intervention in order to improve the following deficits and impairments:  Abnormal gait, Decreased balance, Decreased strength, Difficulty walking, Decreased range of motion, Pain  Visit Diagnosis: Chronic pain of left knee  Muscle weakness (generalized)     Problem List Patient Active Problem List   Diagnosis Date Noted  . Left shoulder pain 12/13/2017  . Pre-diabetes 01/06/2017  . Snoring 04/02/2016  . Osteoarthritis of left knee 12/22/2014  . Peanut allergy   . Acute meniscal tear of knee   . Glaucoma   . Acute medial meniscal tear   . Right eye injury 11/11/2012   Phillips Grout PT, DPT, GCS  Vedh Ptacek 09/14/2019, 5:20 PM  [place] MAIN [facility] SERVICES [address] [place], [place], [address] Phone: [phone]   Fax:  [phone]  Name: Alejandro Beltran MRN: [id number] Date of Birth: 09-19-1966

## 2019-09-14 NOTE — Patient Instructions (Signed)
Access Code: [email or web address]  URL: [email or web address]  Date: 09/14/2019  Prepared by: Roxana Hires   Exercises Supine Quad Set - 10 reps - 3 sets - 5s hold - 3x daily - 7x weekly Supine Heel Slide with Strap - 3 reps - 60s hold - 3x daily - 7x weekly Seated Knee Flexion AAROM - 3 reps - 60s hold - 3x daily - 7x weekly Supine Active Straight Leg Raise - 10 reps - 2 sets - 5s hold - 3x daily - 7x weekly Seated Long Arc Quad - 10 reps - 2 sets - 5s hold - 3x daily - 7x weekly Sit to Stand without Arm Support - 10 reps - 2 sets - 3x daily - 7x weekly Step Up - 10 reps - 2 sets - 3x daily - 7x weekly

## 2019-09-19 ENCOUNTER — Ambulatory Visit

## 2019-09-19 ENCOUNTER — Other Ambulatory Visit: Payer: Self-pay

## 2019-09-19 DIAGNOSIS — G8929 Other chronic pain: Secondary | ICD-10-CM

## 2019-09-19 DIAGNOSIS — M25562 Pain in left knee: Secondary | ICD-10-CM | POA: Diagnosis not present

## 2019-09-19 DIAGNOSIS — M6281 Muscle weakness (generalized): Secondary | ICD-10-CM

## 2019-09-19 NOTE — Therapy (Signed)
[place] MAIN [facility] SERVICES [address] [place], [place], [address] Phone: [phone]   Fax:  [phone]  Physical Therapy Treatment  Patient Details  Name: Alejandro Beltran MRN: [id number] Date of Birth: 08-30-1966 Referring Provider (PT): Dr. Chesley Mires   Encounter Date: 09/19/2019  PT End of Session - 09/19/19 1446    Visit Number  2    Number of Visits  36    Date for PT Re-Evaluation  12/07/19    Authorization Type  Eval: 09/14/19    PT Start Time  1355    PT Stop Time  1433    PT Time Calculation (min)  38 min    Activity Tolerance  Patient tolerated treatment well    Behavior During Therapy  [facility] for tasks assessed/performed       Past Medical History:  Diagnosis Date  . Acute medial meniscal tear    left  . Acute meniscal tear of knee   . Allergy   . Glaucoma   . Peanut allergy     Past Surgical History:  Procedure Laterality Date  . EYE SURGERY     rt retina sx  . MENISCUS REPAIR  08/2015    There were no vitals filed for this visit.  Subjective Assessment - 09/19/19 1407    Subjective  Patient reports compliance with new HEP, is working hard to regain his strength and motion.    Pertinent History  Pt underwent L TKR 08/15/19. He started OP PT 2 days after surgery at [facility] in [place] however due to the distance of the drive he transferred his care to [facility]. Pt reports that his surgeon would like for him to get therapy 3 days/week and they were only able to schedule him for two days per week so he is transferring his care to [facility]. Pt has been performing his HEP at home which includes static L knee flexion and extension stretches as well as additional LLE strengthening. He continues to utilize his Polar Care as well. Pt had a meniscal repair of L knee in 2014 as well as a meniscal repair to his R knee in 2016. Prior to surgery he reports pain but fulll L knee range of motion. Other than his  surgery pt denies any other recent changes to his health.    Limitations  Walking    Patient Stated Goals  Improve L knee range of motion and strength as well as decrease pain    Currently in Pain?  Yes    Pain Score  3     Pain Location  Knee    Pain Orientation  Left    Pain Descriptors / Indicators  Aching    Pain Type  Surgical pain    Pain Onset  1 to 4 weeks ago    Pain Frequency  Constant    Aggravating Factors   exercise or movement    Pain Relieving Factors  ice       3/10    Goals/outcome measures:  5xSTS: 14 seconds no UE support, right foot behind left for significant weight shift.   TUG; 8 seconds no cane 10 MWT : 10 seconds with cane LEFS: 13/80   Treatment: Stair stretch for L knee flexion 30 second hold with cueing for neutral alignment initially with gentle range increase with repetition  Stair negotiation: patient demonstrated how he negotiaties step at a time leading with LLE (surgical limb) for strengthening at home, educated on  safety awareness Toe taps 10x LLE with cueing for focus on hip flexion in neutral alignment, 10x RLE with focus on weight shift/weight acceptance onto surgical leg Stair stretch hamstring stretch cueing/minA for neutral hip alignment 30 second holds  Supine: contract relax press into PT shoulder for isometric contraction with increasing knee flexion 10x 5 second holds Supine quad set into towel, tactile cueing to quad for activation rather than compensatory gluteal activation SLR: focus on quad set then lift 5x, very challenging for patient  Seated:  L ankle df/pf 10x LAQ with focus on slow concentric/eccentric hold 10x  Pt educated throughout session about proper posture and technique with exercises. Improved exercise technique, movement at target joints, use of target muscles after min to mod verbal, visual, tactile cues.   Patient ambulates with excessive knee flexion and a vaulting pattern with limited knee extension and  flexion resulting in poor weight acceptance and shift with ambulation. Goals/outcome measured performed due to time restraints with evaluation. Patient demonstrates function velocity for TUG and 10 MWT however demonstrates the above mentioned gait deficits. Stair negotiation and stretching assessed/performed with patient demonstrating understanding. He will benefit from PT services to address deficits in strength, mobility, and pain in order to return to full function at home with less knee pain.                      PT Education - 09/19/19 1409    Education Details  goals, POC,    Person(s) Educated  Patient    Methods  Explanation;Demonstration;Tactile cues;Verbal cues    Comprehension  Verbalized understanding;Returned demonstration;Verbal cues required;Tactile cues required       PT Short Term Goals - 09/14/19 1706      PT SHORT TERM GOAL #1   Title  Pt will be independent with HEP in order to decrease knee pain and increase strength in order to improve pain-free function.    Time  6    Period  Weeks    Status  New    Target Date  10/26/19        PT Long Term Goals - 09/19/19 1439      PT LONG TERM GOAL #1   Title  Pt will increase LEFS by at least 9 points (22/80) in order to demonstrate significant improvement in lower extremity function.    Baseline  09/14/19: to be completed at next appointment 12/28: 13/80    Time  12    Period  Weeks    Status  New    Target Date  12/07/19      PT LONG TERM GOAL #2   Title  Pt will decrease worst pain as reported on NPRS by at least 3 points in order to demonstrate clinically significant reduction in ankle/foot pain.    Baseline  09/14/19: worst: 8/10    Time  12    Period  Weeks    Status  New    Target Date  12/07/19      PT LONG TERM GOAL #3   Title  Pt will increase strength of L hip abduction and adduction by at least 1/2 MMT grade in order to demonstrate improvement in strength and function    Baseline   09/14/19: abduction: 4/5, adduction: 4-/5    Time  12    Period  Weeks    Status  New    Target Date  12/07/19      PT LONG TERM GOAL #4  Title  Pt will improve L knee range of motion to within 5 degrees of neutral extension and greater than 115 degrees of flexion in order to normalize gait and stair ascend/descend    Baseline  09/13/29: lacking 14 degrees extension, 77 degrees flexion    Time  12    Period  Weeks    Status  New    Target Date  12/07/19      PT LONG TERM GOAL #5   Title  Patient will perform the 5x STS without UE support with equal weight acceptance in LE's in <10 seconds to demonstrate functional strength and mobility.    Baseline  12/28: 14 seconds with no uE suppot, RLE significantly used    Time  12    Period  Weeks    Status  New    Target Date  12/07/18            Plan - 09/19/19 1447    Clinical Impression Statement  Patient ambulates with excessive knee flexion and a vaulting pattern with limited knee extension and flexion resulting in poor weight acceptance and shift with ambulation. Goals/outcome measured performed due to time restraints with evaluation. Patient demonstrates function velocity for TUG and 10 MWT however demonstrates the above mentioned gait deficits. Stair negotiation and stretching assessed/performed with patient demonstrating understanding. He will benefit from PT services to address deficits in strength, mobility, and pain in order to return to full function at home with less knee pain.    Personal Factors and Comorbidities  Comorbidity 1;Past/Current Experience    Comorbidities  R knee meniscal repair    Examination-Activity Limitations  Carry;Caring for Others;Locomotion Level;Stairs;Squat    Examination-Participation Restrictions  Community Activity;Yard Work    Stability/Clinical Decision Making  Stable/Uncomplicated    Rehab Potential  Good    PT Frequency  3x / week    PT Duration  12 weeks    PT Treatment/Interventions   ADLs/Self Care Home Management;Aquatic Therapy;Biofeedback;Canalith Repostioning;Cryotherapy;Electrical Stimulation;Iontophoresis 4mg /ml Dexamethasone;Moist Heat;Traction;Ultrasound;Gait training;DME Instruction;Stair training;Therapeutic exercise;Therapeutic activities;Balance training;Neuromuscular re-education;Patient/family education;Manual techniques;Passive range of motion;Scar mobilization;Dry needling;Vestibular;Joint Manipulations    PT Next Visit Plan  Have pt complete LEFS, perform TUG, 57m gait speed, 5TSTS and update goals    PT Home Exercise Plan  Medbridge Access Code: [email or web address]    Consulted and Agree with Plan of Care  Patient       Patient will benefit from skilled therapeutic intervention in order to improve the following deficits and impairments:  Abnormal gait, Decreased balance, Decreased strength, Difficulty walking, Decreased range of motion, Pain  Visit Diagnosis: Chronic pain of left knee  Muscle weakness (generalized)     Problem List Patient Active Problem List   Diagnosis Date Noted  . Left shoulder pain 12/13/2017  . Pre-diabetes 01/06/2017  . Snoring 04/02/2016  . Osteoarthritis of left knee 12/22/2014  . Peanut allergy   . Acute meniscal tear of knee   . Glaucoma   . Acute medial meniscal tear   . Right eye injury 11/11/2012   11/13/2012, PT, DPT   09/19/2019, 2:48 PM  [facility] MAIN [facility] SERVICES [address] [place], [place], [place] Phone: [phone]   Fax:  [phone]  Name: JALAN FARISS MRN: Rolm Gala Date of Birth: [birth year]

## 2019-09-20 ENCOUNTER — Encounter

## 2019-09-21 ENCOUNTER — Ambulatory Visit

## 2019-09-21 ENCOUNTER — Encounter: Payer: Self-pay | Admitting: Physical Therapy

## 2019-09-21 ENCOUNTER — Other Ambulatory Visit: Payer: Self-pay

## 2019-09-21 DIAGNOSIS — M6281 Muscle weakness (generalized): Secondary | ICD-10-CM

## 2019-09-21 DIAGNOSIS — M25562 Pain in left knee: Secondary | ICD-10-CM

## 2019-09-21 DIAGNOSIS — G8929 Other chronic pain: Secondary | ICD-10-CM

## 2019-09-21 NOTE — Therapy (Signed)
[place] [facility] [address] [place], [place], [address] Phone: [phone]   Fax:  [phone]  Physical Therapy Treatment  Patient Details  Name: Rolm Galallya H Wollin MRN: [id number] Date of Birth: 1966-01-02 Referring Provider (PT): Dr. Harrison Monsavid Rion   Encounter Date: 09/21/2019  PT End of Session - 09/21/19 1449    Visit Number  3    PT Start Time  1345    PT Stop Time  1430    PT Time Calculation (min)  45 min    Activity Tolerance  Patient tolerated treatment well    Behavior During Therapy  [facility] for tasks assessed/performed       Past Medical History:  Diagnosis Date  . Acute medial meniscal tear    left  . Acute meniscal tear of knee   . Allergy   . Glaucoma   . Peanut allergy     Past Surgical History:  Procedure Laterality Date  . EYE SURGERY     rt retina sx  . MENISCUS REPAIR  08/2015    There were no vitals filed for this visit.  Subjective Assessment - 09/21/19 1305    Subjective  Pt states he had a follow up with his surgeon yeterday and his MD reapplied a dressing over his incision site to promote better incision healing.  Pt states he has been working on his HEP- working mostly on knee extension in his bed (heel prop on pillow) and flexion in his bed (with a strap) and also standing (foot on stool).    Pertinent History  Pt underwent L TKR 08/15/19. He started OP PT 2 days after surgery at [facility] in [place] however due to the distance of the drive he [facility]. Pt reports that his surgeon would like for him to get therapy 3 days/week and they were only able to schedule him for two days per week so he is [facility]. Pt has been performing his HEP at home which includes static L knee flexion and extension stretches as well as additional LLE strengthening. He continues to utilize his Polar Care as well. Pt had a meniscal repair of L knee in 2014 as well  as a meniscal repair to his R knee in 2016. Prior to surgery he reports pain but fulll L knee range of motion. Other than his surgery pt denies any other recent changes to his health.    Limitations  Walking    Patient Stated Goals  Improve L knee range of motion and strength as well as decrease pain    Currently in Pain?  Yes    Pain Score  2     Pain Location  Knee    Pain Onset  1 to 4 weeks ago       Today's Treatment:  L Heel slides for knee flexion (with sheet) AROM/AAROM 2x5 (10 sec holds) L Heel prop in supine for knee extension: 3 min L quad set 3x5 (5 sec holds) Manual STM L hamstring/gastroc x 5 min Manual cont/relax (quad activation) sitting at edge of table x5 followed by manual knee flexion stretch x 10 sec; repeated 3x Seated hamstring stretch 3x30 sec Standing calf stretch 3x30 sec LAQ 2x5 (5 sec holds) Seated heel prop knee extension stretch: 3 min (foot on stool while pt seated on table) 4x 30 ft amb with PT cues for L heel strike on initial contact and knee flexion during L  swing phase to promote optimal gait mechanics    Knee AAROM during sessoin: Extension -10 degrees, Flexion 80 degrees Observation: Incision covered in dressing   PT Education - 09/21/19 1447    Education Details  goals, HEP review, POC    Person(s) Educated  Patient    Methods  Demonstration    Comprehension  Verbalized understanding;Returned demonstration       PT Short Term Goals - 09/14/19 1706      PT SHORT TERM GOAL #1   Title  Pt will be independent with HEP in order to decrease knee pain and increase strength in order to improve pain-free function.    Time  6    Period  Weeks    Status  New    Target Date  10/26/19        PT Long Term Goals - 09/19/19 1439      PT LONG TERM GOAL #1   Title  Pt will increase LEFS by at least 9 points (22/80) in order to demonstrate significant improvement in lower extremity function.    Baseline  09/14/19: to be completed at next  appointment 12/28: 13/80    Time  12    Period  Weeks    Status  New    Target Date  12/07/19      PT LONG TERM GOAL #2   Title  Pt will decrease worst pain as reported on NPRS by at least 3 points in order to demonstrate clinically significant reduction in ankle/foot pain.    Baseline  09/14/19: worst: 8/10    Time  12    Period  Weeks    Status  New    Target Date  12/07/19      PT LONG TERM GOAL #3   Title  Pt will increase strength of L hip abduction and adduction by at least 1/2 MMT grade in order to demonstrate improvement in strength and function    Baseline  09/14/19: abduction: 4/5, adduction: 4-/5    Time  12    Period  Weeks    Status  New    Target Date  12/07/19      PT LONG TERM GOAL #4   Title  Pt will improve L knee range of motion to within 5 degrees of neutral extension and greater than 115 degrees of flexion in order to normalize gait and stair ascend/descend    Baseline  09/13/29: lacking 14 degrees extension, 77 degrees flexion    Time  12    Period  Weeks    Status  New    Target Date  12/07/19      PT LONG TERM GOAL #5   Title  Patient will perform the 5x STS without UE support with equal weight acceptance in LE's in <10 seconds to demonstrate functional strength and mobility.    Baseline  12/28: 14 seconds with no uE suppot, RLE significantly used    Time  12    Period  Weeks    Status  New    Target Date  12/07/18            Plan - 09/21/19 1450    Personal Factors and Comorbidities  Comorbidity 1;Past/Current Experience    Comorbidities  R knee meniscal repair    Examination-Activity Limitations  Carry;Caring for Others;Locomotion Level;Stairs;Squat    Examination-Participation Restrictions  Community Activity;Yard Work    Stability/Clinical Decision Making  Stable/Uncomplicated    Rehab Potential  Good  PT Frequency  3x / week    PT Duration  12 weeks    PT Treatment/Interventions  ADLs/Self Care Home Management;Aquatic  Therapy;Biofeedback;Canalith Repostioning;Cryotherapy;Electrical Stimulation;Iontophoresis 4mg /ml Dexamethasone;Moist Heat;Traction;Ultrasound;Gait training;DME Instruction;Stair training;Therapeutic exercise;Therapeutic activities;Balance training;Neuromuscular re-education;Patient/family education;Manual techniques;Passive range of motion;Scar mobilization;Dry needling;Vestibular;Joint Manipulations    PT Next Visit Plan  continue working on knee flexion and extension ROM    PT Home Exercise Plan  Medbridge Access Code: [email or web address], also discussed performing heel prop in seated position in kitchen with foot propped on chair (nothing underneath knee) x 3 min during meal times    Consulted and Agree with Plan of Care  Patient       Patient will benefit from skilled therapeutic intervention in order to improve the following deficits and impairments:  Abnormal gait, Decreased balance, Decreased strength, Difficulty walking, Decreased range of motion, Pain  Visit Diagnosis: Chronic pain of left knee  Muscle weakness (generalized)     Problem List Patient Active Problem List   Diagnosis Date Noted  . Left shoulder pain 12/13/2017  . Pre-diabetes 01/06/2017  . Snoring 04/02/2016  . Osteoarthritis of left knee 12/22/2014  . Peanut allergy   . Acute meniscal tear of knee   . Glaucoma   . Acute medial meniscal tear   . Right eye injury 11/11/2012    11/13/2012 09/21/2019, 2:53 PM  09/23/2019, PT, DPT Physical Therapist - [facility]  Outpatient Physical Therapy- Main Campus [phone]    [facility] MAIN [facility] SERVICES [address] [place], [place], [place] Phone: [phone]   Fax:  [phone]  Name: THEODUS RAN MRN: Rolm Gala Date of Birth: [birth year]

## 2019-09-22 ENCOUNTER — Other Ambulatory Visit: Payer: Self-pay

## 2019-09-22 ENCOUNTER — Ambulatory Visit

## 2019-09-22 DIAGNOSIS — G8929 Other chronic pain: Secondary | ICD-10-CM

## 2019-09-22 DIAGNOSIS — M25562 Pain in left knee: Secondary | ICD-10-CM | POA: Diagnosis not present

## 2019-09-22 DIAGNOSIS — M6281 Muscle weakness (generalized): Secondary | ICD-10-CM

## 2019-09-22 NOTE — Patient Instructions (Signed)
Access Code: [email or web address]  URL: [email or web address]  Date: 09/22/2019  Prepared by: Roxana Hires   Exercises Prone Knee Extension with Ankle Weight - 3 reps - 60s hold - 3x daily - 7x weekly Supine Heel Slide with Strap - 3 reps - 60s hold - 3x daily - 7x weekly Seated Knee Flexion AAROM - 3 reps - 60s hold - 3x daily - 7x weekly Supine Quad Set - 10 reps - 2 sets - 5s hold - 3x daily - 7x weekly Supine Active Straight Leg Raise - 10 reps - 2 sets - 5s hold - 3x daily - 7x weekly Clamshell - 10 reps - 2 sets - 5s hold - 3x daily - 7x weekly Sidelying Hip Abduction - 10 reps - 2 sets - 5s hold - 3x daily - 7x weekly Seated Long Arc Quad - 10 reps - 2 sets - 5s hold - 3x daily - 7x weekly Sit to Stand without Arm Support - 10 reps - 2 sets - 3x daily - 7x weekly Step Up - 10 reps - 2 sets - 3x daily - 7x weekly

## 2019-09-22 NOTE — Therapy (Signed)
[facility] [facility] [address] [place], [place], [address] Phone: [phone]   Fax:  [phone]  Physical Therapy Treatment  Patient Details  Name: Alejandro Beltran MRN: [id number] Date of Birth: 05-May-1966 Referring Provider (PT): Dr. Harrison Monsavid Rion   Encounter Date: 09/22/2019  PT End of Session - 09/22/19 1042    Visit Number  4    Number of Visits  36    Date for PT Re-Evaluation  12/07/19    Authorization Type  Eval: 09/14/19    PT Start Time  1015    PT Stop Time  1110    PT Time Calculation (min)  55 min    Activity Tolerance  Patient tolerated treatment well    Behavior During Therapy  [facility] for tasks assessed/performed       Past Medical History:  Diagnosis Date  . Acute medial meniscal tear    left  . Acute meniscal tear of knee   . Allergy   . Glaucoma   . Peanut allergy     Past Surgical History:  Procedure Laterality Date  . EYE SURGERY     rt retina sx  . MENISCUS REPAIR  08/2015    There were no vitals filed for this visit.  Subjective Assessment - 09/22/19 1038    Subjective  Pt states he is doing well today. He rates his L knee pain as a "2.5/10" upon arrival. Pt states he has been working on his HEP and it is going well. No specific questions or concerns upon arrival.    Pertinent History  Pt underwent L TKR 08/15/19. He started OP PT 2 days after surgery at [facility] in [place] however due to the distance of the drive he [facility]. Pt reports that his surgeon would like for him to get therapy 3 days/week and they were only able to schedule him for two days per week so [facility]. Pt has been performing his HEP at home which includes static L knee flexion and extension stretches as well as additional LLE strengthening. He continues to utilize his Polar Care as well. Pt had a meniscal repair of L knee in 2014 as well as a meniscal repair to  his R knee in 2016. Prior to surgery he reports pain but fulll L knee range of motion. Other than his surgery pt denies any other recent changes to his health.    Limitations  Walking    Patient Stated Goals  Improve L knee range of motion and strength as well as decrease pain    Currently in Pain?  Yes    Pain Score  3    "2.5"   Pain Location  Knee    Pain Orientation  Left    Pain Descriptors / Indicators  Aching    Pain Type  Surgical pain    Pain Onset  1 to 4 weeks ago         TREATMENT   Electrical Stimulation  SupineNMES to L quadwith towel roll under heel and heat pack under hamstring to encourage relaxation.1channel, symmetrical waveform, 300 microsec pulse width, 50hz  rate, 2s ramp, 12s, on, 30s off with small pads due to difficulty with placement related to dressing. Pt tolerated intensity of 24. Therapist providing verbal and tactile cues for active quad contraction when estim unit is on to achieve knee extension x 8 minutes;   Ther-ex  NuStep L0/1 for  warmup and gentle L knee range of motion x 5 minutes; Supine L quad set followed by SLR 2 x 10; R sidelying hip SLR abduction 2 x 10; R sidelying L hip clams 2 x 10; Sit to stand without UE support with RLE on 6" step and slightly extended to bias LLE 2 x 10; Manual cont/relax (quad activation) L knee flexion stretch sitting at edge of table, 5s hold followed by manual knee flexion stretch with 5s relax x 3, 2 bouts performed with moist heat pack on quad for additional relaxation; Gentle STM with "The Stick" roller to L quad between sets of contract relax L knee flexion stretch LAQ x 10 with 3s holds and gentl manual resistance from therapist;   Pt educated throughout session about proper posture and technique with exercises. Improved exercise technique, movement at target joints, use of target muscles after min to mod verbal, visual, tactile cues.    Pt is making good progress with therapy and demonstrates  improved L quad recruitment as well as improved L knee extension following NMES. He also demonstrates gradually improving L knee flexion during contract relax stretching. Pt provided additional exercises to HEP and therapist demonstrated how to perform prone knee extension hangs. Pt will benefit from PT services to address deficits in strength, balance, pain, and mobility in order to return to full function at home and with work.                         PT Short Term Goals - 09/14/19 1706      PT SHORT TERM GOAL #1   Title  Pt will be independent with HEP in order to decrease knee pain and increase strength in order to improve pain-free function.    Time  6    Period  Weeks    Status  New    Target Date  10/26/19        PT Long Term Goals - 09/19/19 1439      PT LONG TERM GOAL #1   Title  Pt will increase LEFS by at least 9 points (22/80) in order to demonstrate significant improvement in lower extremity function.    Baseline  09/14/19: to be completed at next appointment 12/28: 13/80    Time  12    Period  Weeks    Status  New    Target Date  12/07/19      PT LONG TERM GOAL #2   Title  Pt will decrease worst pain as reported on NPRS by at least 3 points in order to demonstrate clinically significant reduction in ankle/foot pain.    Baseline  09/14/19: worst: 8/10    Time  12    Period  Weeks    Status  New    Target Date  12/07/19      PT LONG TERM GOAL #3   Title  Pt will increase strength of L hip abduction and adduction by at least 1/2 MMT grade in order to demonstrate improvement in strength and function    Baseline  09/14/19: abduction: 4/5, adduction: 4-/5    Time  12    Period  Weeks    Status  New    Target Date  12/07/19      PT LONG TERM GOAL #4   Title  Pt will improve L knee range of motion to within 5 degrees of neutral extension and greater than 115 degrees of flexion in order to  normalize gait and stair ascend/descend    Baseline   09/13/29: lacking 14 degrees extension, 77 degrees flexion    Time  12    Period  Weeks    Status  New    Target Date  12/07/19      PT LONG TERM GOAL #5   Title  Patient will perform the 5x STS without UE support with equal weight acceptance in LE's in <10 seconds to demonstrate functional strength and mobility.    Baseline  12/28: 14 seconds with no uE suppot, RLE significantly used    Time  12    Period  Weeks    Status  New    Target Date  12/07/18            Plan - 09/22/19 1109    Clinical Impression Statement  Pt is making good progress with therapy and demonstrates improved L quad recruitment as well as improved L knee extension following NMES. He also demonstrates gradually improving L knee flexion during contract relax stretching. Pt provided additional exercises to HEP and therapist demonstrated how to perform prone knee extension hangs. Pt will benefit from PT services to address deficits in strength, balance, pain, and mobility in order to return to full function at home and with work.    Personal Factors and Comorbidities  Comorbidity 1;Past/Current Experience    Comorbidities  R knee meniscal repair    Examination-Activity Limitations  Carry;Caring for Others;Locomotion Level;Stairs;Squat    Examination-Participation Restrictions  Community Activity;Yard Work    Stability/Clinical Decision Making  Stable/Uncomplicated    Rehab Potential  Good    PT Frequency  3x / week    PT Duration  12 weeks    PT Treatment/Interventions  ADLs/Self Care Home Management;Aquatic Therapy;Biofeedback;Canalith Repostioning;Cryotherapy;Electrical Stimulation;Iontophoresis 4mg /ml Dexamethasone;Moist Heat;Traction;Ultrasound;Gait training;DME Instruction;Stair training;Therapeutic exercise;Therapeutic activities;Balance training;Neuromuscular re-education;Patient/family education;Manual techniques;Passive range of motion;Scar mobilization;Dry needling;Vestibular;Joint Manipulations    PT Next  Visit Plan  continue working on knee flexion and extension ROM    PT Home Exercise Plan  Medbridge Access Code: [email or web address], also discussed performing heel prop in seated position in kitchen with foot propped on chair (nothing underneath knee) x 3 min during meal times    Consulted and Agree with Plan of Care  Patient       Patient will benefit from skilled therapeutic intervention in order to improve the following deficits and impairments:  Abnormal gait, Decreased balance, Decreased strength, Difficulty walking, Decreased range of motion, Pain  Visit Diagnosis: Chronic pain of left knee  Muscle weakness (generalized)     Problem List Patient Active Problem List   Diagnosis Date Noted  . Left shoulder pain 12/13/2017  . Pre-diabetes 01/06/2017  . Snoring 04/02/2016  . Osteoarthritis of left knee 12/22/2014  . Peanut allergy   . Acute meniscal tear of knee   . Glaucoma   . Acute medial meniscal tear   . Right eye injury 11/11/2012   11/13/2012 PT, DPT, GCS  Terrisa Curfman 09/22/2019, 11:52 AM  [place] [facility] MAIN [facility] SERVICES [address] [place], [place], [place] Phone: [phone]   Fax:  [phone]  Name: Alejandro Beltran MRN: Alejandro Gala Date of Birth: 1965/10/28

## 2019-09-26 ENCOUNTER — Ambulatory Visit

## 2019-09-26 ENCOUNTER — Ambulatory Visit: Attending: Nurse Practitioner

## 2019-09-26 ENCOUNTER — Other Ambulatory Visit: Payer: Self-pay

## 2019-09-26 DIAGNOSIS — M6281 Muscle weakness (generalized): Secondary | ICD-10-CM | POA: Insufficient documentation

## 2019-09-26 DIAGNOSIS — G8929 Other chronic pain: Secondary | ICD-10-CM | POA: Insufficient documentation

## 2019-09-26 DIAGNOSIS — M25562 Pain in left knee: Secondary | ICD-10-CM | POA: Insufficient documentation

## 2019-09-26 NOTE — Therapy (Signed)
[facility] MAIN [facility] SERVICES [address] [place], [place], [address] Phone: [phone]   Fax:  [phone]  Physical Therapy Treatment  Patient Details  Name: Alejandro Beltran MRN: [id number] Date of Birth: 10-14-1965 Referring Provider (PT): Dr. Harrison Mons   Encounter Date: 09/26/2019  PT End of Session - 09/26/19 1002    Visit Number  5    Number of Visits  36    Date for PT Re-Evaluation  12/07/19    Authorization Type  Eval: 09/14/19    PT Start Time  0930    PT Stop Time  1030    PT Time Calculation (min)  60 min    Activity Tolerance  Patient tolerated treatment well    Behavior During Therapy  [facility] for tasks assessed/performed       Past Medical History:  Diagnosis Date  . Acute medial meniscal tear    left  . Acute meniscal tear of knee   . Allergy   . Glaucoma   . Peanut allergy     Past Surgical History:  Procedure Laterality Date  . EYE SURGERY     rt retina sx  . MENISCUS REPAIR  08/2015    There were no vitals filed for this visit.  Subjective Assessment - 09/26/19 1000    Subjective  Pt states he is doing well today. He had some increased pain in his L knee over the weekend but it has improved slighlty today. He currently rates his L knee pain as a 3/10 upon arrival. Pt states he has been working on his HEP and it is going well. He initiated the prone L knee extension stretch and believes that it was helpful in improving extension. He has also tried to use a device called the [place] that his wife bought him with some success. No specific questions or concerns upon arrival    Pertinent History  Pt underwent L TKR 08/15/19. He started OP PT 2 days after surgery at [facility] in [place] however due to the distance of the drive he transferred his care to [facility]. Pt reports that his surgeon would like for him to get therapy 3 days/week and they were only able to schedule him for two days per week so he  is transferring his care to [facility]. Pt has been performing his HEP at home which includes static L knee flexion and extension stretches as well as additional LLE strengthening. He continues to utilize his Polar Care as well. Pt had a meniscal repair of L knee in 2014 as well as a meniscal repair to his R knee in 2016. Prior to surgery he reports pain but fulll L knee range of motion. Other than his surgery pt denies any other recent changes to his health.    Limitations  Walking    Patient Stated Goals  Improve L knee range of motion and strength as well as decrease pain    Currently in Pain?  Yes    Pain Score  3     Pain Location  Knee    Pain Orientation  Left    Pain Descriptors / Indicators  Aching    Pain Type  Surgical pain    Pain Onset  1 to 4 weeks ago    Pain Frequency  Constant         TREATMENT   Electrical Stimulation  SupineNMES to L quadwith towel roll under heel and heat pack under hamstring to encourage  relaxation.1channel, symmetrical waveform, 300 microsec pulse width, 50hz  rate, 2s ramp, 12s, on, 30s off with large pads due to difficulty with tolerance. Utilized nerve stimulator to locate a motor point for improved stimulation. Pt tolerated intensity of 26. Therapist providing verbal and tactile cues for active quad contraction when estim unit is on toachieve knee extension x 8 minutes;   Ther-ex  NuStep L0/1 for warmup and gentle L knee range of motion, started at seat position 9 and moved up to seat position 8, x 5 minutes during history ; Axillary crutch adjusted to correct height for patient; Supine L quad set followed by SLR 2 x 10; R sidelying hip SLR abduction 2 x 10, added 2# ankle weight for second set to increase challenge; R sidelying L hip clams 2 x 10, gentle manual resistance provided by therapist; Hooklying bridges x 10; Manual cont/relax (quad activation) L knee flexion stretch sitting at edge of table, 5s hold followed by manual knee flexion  stretch with 5s relax x 3, 2 bouts; Gentle STM with "The Stick" roller to L quad between sets of contract relax L knee flexion stretch Passive L knee extension stretch in supine with L heel on towel roll and therapist providing gentle overpressure to patient tolerance 10s hold x 3; Standing L knee flexion step stretch 30s x 3; L knee AAROM extension obtained followed: lacking 7 degrees of extension; L knee AAROM flexion: 76 degrees LAQ x 10 with 3s holds and gentle manual resistance from therapist;   Pt educated throughout session about proper posture and technique with exercises. Improved exercise technique, movement at target joints, use of target muscles after min to mod verbal, visual, tactile cues.    Pt is making good progress with therapy and demonstrates significant improvement in his knee extension strength improving from -14 degrees at initial evaluation to -7 degrees today. His knee flexion is essentially unchanged measuring 77 degrees at initial evaluation to 76 degrees today. He is demonstrating improved gait with single axillary crutch. Pt encouraged to continue to also focus on knee flexion stretches. He has a follow-up appointment with his orthopedic surgeon tomorrow. Pt will benefit from PT services to address deficits in strength, balance, pain, and mobility in order to return to full function at home and with work.                         PT Short Term Goals - 09/14/19 1706      PT SHORT TERM GOAL #1   Title  Pt will be independent with HEP in order to decrease knee pain and increase strength in order to improve pain-free function.    Time  6    Period  Weeks    Status  New    Target Date  10/26/19        PT Long Term Goals - 09/19/19 1439      PT LONG TERM GOAL #1   Title  Pt will increase LEFS by at least 9 points (22/80) in order to demonstrate significant improvement in lower extremity function.    Baseline  09/14/19: to be completed at  next appointment 12/28: 13/80    Time  12    Period  Weeks    Status  New    Target Date  12/07/19      PT LONG TERM GOAL #2   Title  Pt will decrease worst pain as reported on NPRS by at least 3 points in  order to demonstrate clinically significant reduction in ankle/foot pain.    Baseline  09/14/19: worst: 8/10    Time  12    Period  Weeks    Status  New    Target Date  12/07/19      PT LONG TERM GOAL #3   Title  Pt will increase strength of L hip abduction and adduction by at least 1/2 MMT grade in order to demonstrate improvement in strength and function    Baseline  09/14/19: abduction: 4/5, adduction: 4-/5    Time  12    Period  Weeks    Status  New    Target Date  12/07/19      PT LONG TERM GOAL #4   Title  Pt will improve L knee range of motion to within 5 degrees of neutral extension and greater than 115 degrees of flexion in order to normalize gait and stair ascend/descend    Baseline  09/13/29: lacking 14 degrees extension, 77 degrees flexion    Time  12    Period  Weeks    Status  New    Target Date  12/07/19      PT LONG TERM GOAL #5   Title  Patient will perform the 5x STS without UE support with equal weight acceptance in LE's in <10 seconds to demonstrate functional strength and mobility.    Baseline  12/28: 14 seconds with no uE suppot, RLE significantly used    Time  12    Period  Weeks    Status  New    Target Date  12/07/18            Plan - 09/26/19 1043    Clinical Impression Statement  Pt is making good progress with therapy and demonstrates significant improvement in his knee extension strength improving from -14 degrees at initial evaluation to -7 degrees today. His knee flexion is essentially unchanged measuring 77 degrees at initial evaluation to 76 degrees today. He is demonstrating improved gait with single axillary crutch. Pt encouraged to continue to also focus on knee flexion stretches. He has a follow-up appointment with his orthopedic  surgeon tomorrow. Pt will benefit from PT services to address deficits in strength, balance, pain, and mobility in order to return to full function at home and with work.    Personal Factors and Comorbidities  Comorbidity 1;Past/Current Experience    Comorbidities  R knee meniscal repair    Examination-Activity Limitations  Carry;Caring for Others;Locomotion Level;Stairs;Squat    Examination-Participation Restrictions  Community Activity;Yard Work    Stability/Clinical Decision Making  Stable/Uncomplicated    Rehab Potential  Good    PT Frequency  3x / week    PT Duration  12 weeks    PT Treatment/Interventions  ADLs/Self Care Home Management;Aquatic Therapy;Biofeedback;Canalith Repostioning;Cryotherapy;Electrical Stimulation;Iontophoresis 4mg /ml Dexamethasone;Moist Heat;Traction;Ultrasound;Gait training;DME Instruction;Stair training;Therapeutic exercise;Therapeutic activities;Balance training;Neuromuscular re-education;Patient/family education;Manual techniques;Passive range of motion;Scar mobilization;Dry needling;Vestibular;Joint Manipulations    PT Next Visit Plan  continue working on knee flexion and extension ROM    PT Home Exercise Plan  Medbridge Access Code: [email or web address], also discussed performing heel prop in seated position in kitchen with foot propped on chair (nothing underneath knee) x 3 min during meal times    Consulted and Agree with Plan of Care  Patient       Patient will benefit from skilled therapeutic intervention in order to improve the following deficits and impairments:  Abnormal gait, Decreased balance, Decreased strength, Difficulty walking, Decreased range of motion,  Pain  Visit Diagnosis: Chronic pain of left knee  Muscle weakness (generalized)     Problem List Patient Active Problem List   Diagnosis Date Noted  . Left shoulder pain 12/13/2017  . Pre-diabetes 01/06/2017  . Snoring 04/02/2016  . Osteoarthritis of left knee 12/22/2014  . Peanut allergy   .  Acute meniscal tear of knee   . Glaucoma   . Acute medial meniscal tear   . Right eye injury 11/11/2012    Lynnea Maizes PT, DPT, GCS  Xayla Puzio 09/26/2019, 9:25 PM  [facility] MAIN [facility] SERVICES [address] [place], [place], [address] Phone: [phone]   Fax:  [phone]  Name: Alejandro Beltran MRN: [id number] Date of Birth: 09-06-1966

## 2019-09-27 ENCOUNTER — Other Ambulatory Visit: Payer: Self-pay

## 2019-09-27 ENCOUNTER — Ambulatory Visit

## 2019-09-27 DIAGNOSIS — M25562 Pain in left knee: Secondary | ICD-10-CM | POA: Diagnosis not present

## 2019-09-27 DIAGNOSIS — G8929 Other chronic pain: Secondary | ICD-10-CM

## 2019-09-27 DIAGNOSIS — M6281 Muscle weakness (generalized): Secondary | ICD-10-CM

## 2019-09-27 NOTE — Therapy (Signed)
[facility] MAIN [facility] SERVICES [address] [place], [place], [address] Phone: [phone]   Fax:  [phone]  Physical Therapy Treatment  Patient Details  Name: Alejandro Beltran MRN: [id number] Date of Birth: 03/29/1966 Referring Provider (PT): Dr. Harrison Mons   Encounter Date: 09/27/2019  PT End of Session - 09/27/19 1723    Visit Number  6    Number of Visits  36    Date for PT Re-Evaluation  12/07/19    Authorization Type  Eval: 09/14/19    PT Start Time  1630    PT Stop Time  1708    PT Time Calculation (min)  38 min    Equipment Utilized During Treatment  Gait belt    Activity Tolerance  Patient tolerated treatment well    Behavior During Therapy  [facility] for tasks assessed/performed       Past Medical History:  Diagnosis Date  . Acute medial meniscal tear    left  . Acute meniscal tear of knee   . Allergy   . Glaucoma   . Peanut allergy     Past Surgical History:  Procedure Laterality Date  . EYE SURGERY     rt retina sx  . MENISCUS REPAIR  08/2015    There were no vitals filed for this visit.  Subjective Assessment - 09/27/19 1718    Subjective  Patient went to physician earlier today, had surgical wound coverage replaced rather than taken off due to knee not fully healed. Patient arrived late to session.    Pertinent History  Pt underwent L TKR 08/15/19. He started OP PT 2 days after surgery at [facility] in [place] however due to the distance of the drive he transferred his care to [facility]. Pt reports that his surgeon would like for him to get therapy 3 days/week and they were only able to schedule him for two days per week so he is transferring his care to [facility]. Pt has been performing his HEP at home which includes static L knee flexion and extension stretches as well as additional LLE strengthening. He continues to utilize his Polar Care as well. Pt had a meniscal repair of L knee in 2014 as well as a  meniscal repair to his R knee in 2016. Prior to surgery he reports pain but fulll L knee range of motion. Other than his surgery pt denies any other recent changes to his health.    Limitations  Walking    Patient Stated Goals  Improve L knee range of motion and strength as well as decrease pain    Currently in Pain?  Yes    Pain Score  3     Pain Location  Knee    Pain Orientation  Left    Pain Descriptors / Indicators  Aching    Pain Type  Surgical pain    Pain Onset  1 to 4 weeks ago    Pain Frequency  Constant         TherEx: Nustep level 0, focus on neutral alignment of LLE 3 minutes  Seated: Contract relax against PT resistance agonist/antagonist reactions. 10x 3 second antagonist contract, 10x 3 second agonist contract  Prone: L knee hamstring curl with PT overpressure at end range 12x with focus on slow controlled contraction  Supine: Contract relax against PT resistance with LE at 90 90 with increasing muscle contraction with repetition.  SLR with quad set prior to lift 10x,  SAQ  LLE over bolster: ER 10x, IR 8x very challenging  Hamstring stretch with leg on PT shoulder 60 second hold with STM to posterior aspect of musculature.  Adjustment of assistive device: dropped one inch for improved alignment with patient's height.    Patient educated on signs and symptoms of infection with L knee, demonstrated understanding   Patient needs occasional verbal cueing to improve posture and cueing to correctly perform exercises slowly, holding at end of range to increase motor firing of desired muscle to encourage fatigue.              PT Education - 09/27/19 1723    Education Details  exercise technique, body mechanics    Person(s) Educated  Patient    Methods  Explanation;Demonstration;Tactile cues;Verbal cues    Comprehension  Verbalized understanding;Returned demonstration;Verbal cues required;Tactile cues required       PT Short Term Goals - 09/14/19 1706       PT SHORT TERM GOAL #1   Title  Pt will be independent with HEP in order to decrease knee pain and increase strength in order to improve pain-free function.    Time  6    Period  Weeks    Status  New    Target Date  10/26/19        PT Long Term Goals - 09/19/19 1439      PT LONG TERM GOAL #1   Title  Pt will increase LEFS by at least 9 points (22/80) in order to demonstrate significant improvement in lower extremity function.    Baseline  09/14/19: to be completed at next appointment 12/28: 13/80    Time  12    Period  Weeks    Status  New    Target Date  12/07/19      PT LONG TERM GOAL #2   Title  Pt will decrease worst pain as reported on NPRS by at least 3 points in order to demonstrate clinically significant reduction in ankle/foot pain.    Baseline  09/14/19: worst: 8/10    Time  12    Period  Weeks    Status  New    Target Date  12/07/19      PT LONG TERM GOAL #3   Title  Pt will increase strength of L hip abduction and adduction by at least 1/2 MMT grade in order to demonstrate improvement in strength and function    Baseline  09/14/19: abduction: 4/5, adduction: 4-/5    Time  12    Period  Weeks    Status  New    Target Date  12/07/19      PT LONG TERM GOAL #4   Title  Pt will improve L knee range of motion to within 5 degrees of neutral extension and greater than 115 degrees of flexion in order to normalize gait and stair ascend/descend    Baseline  09/13/29: lacking 14 degrees extension, 77 degrees flexion    Time  12    Period  Weeks    Status  New    Target Date  12/07/19      PT LONG TERM GOAL #5   Title  Patient will perform the 5x STS without UE support with equal weight acceptance in LE's in <10 seconds to demonstrate functional strength and mobility.    Baseline  12/28: 14 seconds with no uE suppot, RLE significantly used    Time  12    Period  Weeks  Status  New    Target Date  12/07/18            Plan - 09/27/19 1739    Clinical  Impression Statement  Patient arrived late to physical therapy session limiting session duration, patient held late to allow for continuation of progression of strengthening interventions and functional range of motion. Patient educated on signs and symptoms of infection and verbalized understanding of need to call physician if they occur due to slow wound healing. Pt will benefit from PT services to address deficits in strength, balance, pain, and mobility in order to return to full function at home and with work.    Personal Factors and Comorbidities  Comorbidity 1;Past/Current Experience    Comorbidities  R knee meniscal repair    Examination-Activity Limitations  Carry;Caring for Others;Locomotion Level;Stairs;Squat    Examination-Participation Restrictions  Community Activity;Yard Work    Stability/Clinical Decision Making  Stable/Uncomplicated    Rehab Potential  Good    PT Frequency  3x / week    PT Duration  12 weeks    PT Treatment/Interventions  ADLs/Self Care Home Management;Aquatic Therapy;Biofeedback;Canalith Repostioning;Cryotherapy;Electrical Stimulation;Iontophoresis 4mg /ml Dexamethasone;Moist Heat;Traction;Ultrasound;Gait training;DME Instruction;Stair training;Therapeutic exercise;Therapeutic activities;Balance training;Neuromuscular re-education;Patient/family education;Manual techniques;Passive range of motion;Scar mobilization;Dry needling;Vestibular;Joint Manipulations    PT Next Visit Plan  continue working on knee flexion and extension ROM    PT Home Exercise Plan  Medbridge Access Code: [email or web address], also discussed performing heel prop in seated position in kitchen with foot propped on chair (nothing underneath knee) x 3 min during meal times    Consulted and Agree with Plan of Care  Patient       Patient will benefit from skilled therapeutic intervention in order to improve the following deficits and impairments:  Abnormal gait, Decreased balance, Decreased strength, Difficulty  walking, Decreased range of motion, Pain  Visit Diagnosis: Chronic pain of left knee  Muscle weakness (generalized)     Problem List Patient Active Problem List   Diagnosis Date Noted  . Left shoulder pain 12/13/2017  . Pre-diabetes 01/06/2017  . Snoring 04/02/2016  . Osteoarthritis of left knee 12/22/2014  . Peanut allergy   . Acute meniscal tear of knee   . Glaucoma   . Acute medial meniscal tear   . Right eye injury 11/11/2012   Janna Arch, PT, DPT   09/27/2019, 5:40 PM  [place] MAIN [facility] SERVICES [address] [place], [place], [address] Phone: [phone]   Fax:  [phone]  Name: Alejandro Beltran MRN: [id number] Date of Birth: 1966-02-08

## 2019-09-29 ENCOUNTER — Ambulatory Visit

## 2019-09-29 ENCOUNTER — Other Ambulatory Visit: Payer: Self-pay

## 2019-09-29 DIAGNOSIS — M25562 Pain in left knee: Secondary | ICD-10-CM | POA: Diagnosis not present

## 2019-09-29 DIAGNOSIS — M6281 Muscle weakness (generalized): Secondary | ICD-10-CM

## 2019-09-29 DIAGNOSIS — G8929 Other chronic pain: Secondary | ICD-10-CM

## 2019-09-29 NOTE — Therapy (Signed)
[place] [facility] MAIN [facility] SERVICES [address] [place], [place], [address] Phone: [phone]   Fax:  [phone]  Physical Therapy Treatment  Patient Details  Name: Alejandro Beltran MRN: [id number] Date of Birth: 24-Oct-1965 Referring Provider (PT): Dr. Harrison Mons   Encounter Date: 09/29/2019  PT End of Session - 09/30/19 1204    Visit Number  7    Number of Visits  36    Date for PT Re-Evaluation  12/07/19    Authorization Type  Eval: 09/14/19    PT Start Time  1651    PT Stop Time  1730    PT Time Calculation (min)  39 min    Equipment Utilized During Treatment  Gait belt    Activity Tolerance  Patient tolerated treatment well    Behavior During Therapy  [facility] for tasks assessed/performed       Past Medical History:  Diagnosis Date  . Acute medial meniscal tear    left  . Acute meniscal tear of knee   . Allergy   . Glaucoma   . Peanut allergy     Past Surgical History:  Procedure Laterality Date  . EYE SURGERY     rt retina sx  . MENISCUS REPAIR  08/2015    There were no vitals filed for this visit.  Subjective Assessment - 09/30/19 1203    Subjective  Patient slightly late to PT session. Is wearing wound vac. No falls or LOB since last session. Has been compliant with HEP.    Pertinent History  Pt underwent L TKR 08/15/19. He started OP PT 2 days after surgery at [facility] in [place] however due to the distance of the drive he transferred his care to [facility]. Pt reports that his surgeon would like for him to get therapy 3 days/week and they were only able to schedule him for two days per week so he is transferring his care to [facility]. Pt has been performing his HEP at home which includes static L knee flexion and extension stretches as well as additional LLE strengthening. He continues to utilize his Polar Care as well. Pt had a meniscal repair of L knee in 2014 as well as a meniscal repair to his R knee in 2016. Prior  to surgery he reports pain but fulll L knee range of motion. Other than his surgery pt denies any other recent changes to his health.    Limitations  Walking    Patient Stated Goals  Improve L knee range of motion and strength as well as decrease pain    Currently in Pain?  Yes    Pain Score  2     Pain Location  Knee    Pain Orientation  Left    Pain Descriptors / Indicators  Aching    Pain Type  Surgical pain    Pain Onset  1 to 4 weeks ago    Pain Frequency  Constant                 Seated: Contract relax against PT resistance agonist/antagonist reactions. 10x 3 second antagonist contract with increasing range:   sit to stand with 6" step under RLE for weight shift onto LLE, cueing for proper body mechanics 10x  Standing:  Stair knee flexion progression with increasing range of motion with repetition with alternation between kflexion and extension with railing assistance, cueing for neutral pelvic alignment   Prone: L knee hamstring curl with PT overpressure  at end range 12x with focus on slow controlled contraction  Straight leg abduction 10x   Supine: Contract relax against PT resistance with LE at 90 90 with increasing muscle contraction with repetition.  SAQ LLE over bolster: ER 10x, IR 8x very challenging to patient, cueing required for body mechanics and sequencing.   Hamstring stretch with leg on PT shoulder 60 second hold with STM to posterior aspect of musculature.   Knee ROM seated edge of raised plinth table: AROM: 79 degrees PROM: 101 degrees.    Patient needs occasional verbal cueing to improve posture and cueing to correctly perform exercises slowly, holding at end of range to increase motor firing of desired muscle to encourage fatigue.                  PT Education - 09/30/19 1204    Education Details  exercise technique, body mechanics    Person(s) Educated  Patient    Methods  Explanation;Demonstration;Tactile cues;Verbal cues     Comprehension  Verbalized understanding;Returned demonstration;Verbal cues required;Tactile cues required       PT Short Term Goals - 09/14/19 1706      PT SHORT TERM GOAL #1   Title  Pt will be independent with HEP in order to decrease knee pain and increase strength in order to improve pain-free function.    Time  6    Period  Weeks    Status  New    Target Date  10/26/19        PT Long Term Goals - 09/19/19 1439      PT LONG TERM GOAL #1   Title  Pt will increase LEFS by at least 9 points (22/80) in order to demonstrate significant improvement in lower extremity function.    Baseline  09/14/19: to be completed at next appointment 12/28: 13/80    Time  12    Period  Weeks    Status  New    Target Date  12/07/19      PT LONG TERM GOAL #2   Title  Pt will decrease worst pain as reported on NPRS by at least 3 points in order to demonstrate clinically significant reduction in ankle/foot pain.    Baseline  09/14/19: worst: 8/10    Time  12    Period  Weeks    Status  New    Target Date  12/07/19      PT LONG TERM GOAL #3   Title  Pt will increase strength of L hip abduction and adduction by at least 1/2 MMT grade in order to demonstrate improvement in strength and function    Baseline  09/14/19: abduction: 4/5, adduction: 4-/5    Time  12    Period  Weeks    Status  New    Target Date  12/07/19      PT LONG TERM GOAL #4   Title  Pt will improve L knee range of motion to within 5 degrees of neutral extension and greater than 115 degrees of flexion in order to normalize gait and stair ascend/descend    Baseline  09/13/29: lacking 14 degrees extension, 77 degrees flexion    Time  12    Period  Weeks    Status  New    Target Date  12/07/19      PT LONG TERM GOAL #5   Title  Patient will perform the 5x STS without UE support with equal weight acceptance in LE's in <10  seconds to demonstrate functional strength and mobility.    Baseline  12/28: 14 seconds with no uE suppot,  RLE significantly used    Time  12    Period  Weeks    Status  New    Target Date  12/07/18            Plan - 09/30/19 1206    Clinical Impression Statement  Patient presents with excellent motivation, his flexion of surgical limb is improving with active ROM as well as passively. Patient is progressing with strengthening interventions closed and open chain with fatigue with repetition requiring increased cueing for proper body mechanics. Pt will benefit from PT services to address deficits in strength, balance, pain, and mobility in order to return to full function at home and with work    Personal Factors and Comorbidities  Comorbidity 1;Past/Current Experience    Comorbidities  R knee meniscal repair    Examination-Activity Limitations  Carry;Caring for Others;Locomotion Level;Stairs;Squat    Examination-Participation Restrictions  Community Activity;Yard Work    Stability/Clinical Decision Making  Stable/Uncomplicated    Rehab Potential  Good    PT Frequency  3x / week    PT Duration  12 weeks    PT Treatment/Interventions  ADLs/Self Care Home Management;Aquatic Therapy;Biofeedback;Canalith Repostioning;Cryotherapy;Electrical Stimulation;Iontophoresis 4mg /ml Dexamethasone;Moist Heat;Traction;Ultrasound;Gait training;DME Instruction;Stair training;Therapeutic exercise;Therapeutic activities;Balance training;Neuromuscular re-education;Patient/family education;Manual techniques;Passive range of motion;Scar mobilization;Dry needling;Vestibular;Joint Manipulations    PT Next Visit Plan  continue working on knee flexion and extension ROM    PT Home Exercise Plan  Medbridge Access Code: [email or web address], also discussed performing heel prop in seated position in kitchen with foot propped on chair (nothing underneath knee) x 3 min during meal times    Consulted and Agree with Plan of Care  Patient       Patient will benefit from skilled therapeutic intervention in order to improve the following  deficits and impairments:  Abnormal gait, Decreased balance, Decreased strength, Difficulty walking, Decreased range of motion, Pain  Visit Diagnosis: Chronic pain of left knee  Muscle weakness (generalized)     Problem List Patient Active Problem List   Diagnosis Date Noted  . Left shoulder pain 12/13/2017  . Pre-diabetes 01/06/2017  . Snoring 04/02/2016  . Osteoarthritis of left knee 12/22/2014  . Peanut allergy   . Acute meniscal tear of knee   . Glaucoma   . Acute medial meniscal tear   . Right eye injury 11/11/2012   11/13/2012, PT, DPT   09/30/2019, 12:07 PM  [place] [facility] MAIN [facility] SERVICES [address] [place], [place], [place] Phone: [phone]   Fax:  [phone]  Name: Alejandro Beltran MRN: Rolm Gala Date of Birth: [birth year]

## 2019-10-03 ENCOUNTER — Encounter

## 2019-10-04 ENCOUNTER — Ambulatory Visit

## 2019-10-05 ENCOUNTER — Other Ambulatory Visit: Payer: Self-pay

## 2019-10-05 ENCOUNTER — Ambulatory Visit

## 2019-10-05 DIAGNOSIS — M25562 Pain in left knee: Secondary | ICD-10-CM

## 2019-10-05 DIAGNOSIS — M6281 Muscle weakness (generalized): Secondary | ICD-10-CM

## 2019-10-05 DIAGNOSIS — G8929 Other chronic pain: Secondary | ICD-10-CM

## 2019-10-05 NOTE — Therapy (Signed)
[facility] [facility] MAIN [facility] SERVICES [address] [place], [place], [address] Phone: [phone]   Fax:  [phone]  Physical Therapy Treatment  Patient Details  Name: Alejandro Beltran MRN: [id number] Date of Birth: Apr 17, 1966 Referring Provider (PT): Dr. Harrison Mons   Encounter Date: 10/05/2019  PT End of Session - 10/05/19 0903    Visit Number  8    Number of Visits  36    Date for PT Re-Evaluation  12/07/19    Authorization Type  Eval: 09/14/19    PT Start Time  0900    PT Stop Time  0930    PT Time Calculation (min)  30 min    Equipment Utilized During Treatment  Gait belt    Activity Tolerance  Patient tolerated treatment well    Behavior During Therapy  [facility] for tasks assessed/performed       Past Medical History:  Diagnosis Date  . Acute medial meniscal tear    left  . Acute meniscal tear of knee   . Allergy   . Glaucoma   . Peanut allergy     Past Surgical History:  Procedure Laterality Date  . EYE SURGERY     rt retina sx  . MENISCUS REPAIR  08/2015    There were no vitals filed for this visit.  Subjective Assessment - 10/05/19 0900    Subjective  Pt is doing well today. He had his follow-up ortho appointment yesterday and reports that his dressing was removed. He is meeting with a DME rep today who is supposed to deliver a knee brace to help him with knee flexion. No falls or LOB since last session. Has been compliant with HEP. Denies pain upon arrival today but states that he has been having some pain at night around a 3/10.    Pertinent History  Pt underwent L TKR 08/15/19. He started OP PT 2 days after surgery at [facility] in [place] however due to the distance of the drive he transferred his care to [facility]. Pt reports that his surgeon would like for him to get therapy 3 days/week and they were only able to schedule him for two days per week so he is transferring his care to [facility]. Pt has been performing his  HEP at home which includes static L knee flexion and extension stretches as well as additional LLE strengthening. He continues to utilize his Polar Care as well. Pt had a meniscal repair of L knee in 2014 as well as a meniscal repair to his R knee in 2016. Prior to surgery he reports pain but fulll L knee range of motion. Other than his surgery pt denies any other recent changes to his health.    Limitations  Walking    Patient Stated Goals  Improve L knee range of motion and strength as well as decrease pain    Currently in Pain?  No/denies    Pain Onset  --          TREATMENT   Electrical Stimulation, unbilled SupineNMES to L quadwithtowel roll under heel and heat pack under hamstring to encourage relaxation.1channel, symmetrical waveform, 300 microsec pulse width, 50hz  rate, 2s ramp, 12s, on, 30s off withlarge pads due to difficulty with tolerance. Pt tolerated intensity of 35 today and is able to achieve a good tetanic quad contraction. After removing pads he continues to demonstrate sluggish L quad contraction;    Ther-ex NuStep L0 for warmup and gentle L knee range  of motion, seat position 9 x 5 minutes during history ; Single point cane practice with patient to assess for safety, pt reports he has been performing gait with single point cane upstairs in his home. Utilized single point cane in RUE and no cues required for proper sequencing. He demonstrates no L knee buckling during gait. Practiced limited distances without any assistive device and pt demonstrates adequate safety however therapist recommend he try some limited ambulation around the house with single point cane and progress to using a cane in the community. He is currently not strong enough in LLE to ambulate without assistive device without CGA from therapist for safety;  Quantum L single leg press 45# x 10, 60# x 10;   Manual Therapy  L tibia on femur AP mobilizations with knee bent to available end range  flexion, grade III, 30s/bout x 3 bouts; L patella inferior mobilizations to increase L knee flexion, grade III, 30s/bout x 3 bouts;   Pt educated throughout session about proper posture and technique with exercises. Improved exercise technique, movement at target joints, use of target muscles after min to mod verbal, visual, tactile cues.   Pt arrived late today so session was somewhat limited. He continues to demonstrate improvement in his L knee extension however his L knee flexion remains very limited. He is able to demonstrate good safety while ambulating with single point cane and therapist recommended he try some limited ambulation around the house with single point cane and progress to using a cane in the community. He is currently not strong enough in LLE to ambulate without assistive device without CGA from therapist for added safety. Initiated L single leg press today.Pt will benefit from PT services to address deficits in strength, balance,pain,and mobility in order to return to full function at homeand with work.                       PT Short Term Goals - 09/14/19 1706      PT SHORT TERM GOAL #1   Title  Pt will be independent with HEP in order to decrease knee pain and increase strength in order to improve pain-free function.    Time  6    Period  Weeks    Status  New    Target Date  10/26/19        PT Long Term Goals - 09/19/19 1439      PT LONG TERM GOAL #1   Title  Pt will increase LEFS by at least 9 points (22/80) in order to demonstrate significant improvement in lower extremity function.    Baseline  09/14/19: to be completed at next appointment 12/28: 13/80    Time  12    Period  Weeks    Status  New    Target Date  12/07/19      PT LONG TERM GOAL #2   Title  Pt will decrease worst pain as reported on NPRS by at least 3 points in order to demonstrate clinically significant reduction in ankle/foot pain.    Baseline  09/14/19: worst:  8/10    Time  12    Period  Weeks    Status  New    Target Date  12/07/19      PT LONG TERM GOAL #3   Title  Pt will increase strength of L hip abduction and adduction by at least 1/2 MMT grade in order to demonstrate improvement in strength and function  Baseline  09/14/19: abduction: 4/5, adduction: 4-/5    Time  12    Period  Weeks    Status  New    Target Date  12/07/19      PT LONG TERM GOAL #4   Title  Pt will improve L knee range of motion to within 5 degrees of neutral extension and greater than 115 degrees of flexion in order to normalize gait and stair ascend/descend    Baseline  09/13/29: lacking 14 degrees extension, 77 degrees flexion    Time  12    Period  Weeks    Status  New    Target Date  12/07/19      PT LONG TERM GOAL #5   Title  Patient will perform the 5x STS without UE support with equal weight acceptance in LE's in <10 seconds to demonstrate functional strength and mobility.    Baseline  12/28: 14 seconds with no uE suppot, RLE significantly used    Time  12    Period  Weeks    Status  New    Target Date  12/07/18            Plan - 10/05/19 8676    Clinical Impression Statement  Pt arrived late today so session was somewhat limited. He continues to demonstrate improvement in his L knee extension however his L knee flexion remains very limited. He is able to demonstrate good safety while ambulating with single point cane and therapist recommended he try some limited ambulation around the house with single point cane and progress to using a cane in the community. He is currently not strong enough in LLE to ambulate without assistive device without CGA from therapist for added safety. Initiated L single leg press today. Pt will benefit from PT services to address deficits in strength, balance, pain, and mobility in order to return to full function at home and with work.    Personal Factors and Comorbidities  Comorbidity 1;Past/Current Experience     Comorbidities  R knee meniscal repair    Examination-Activity Limitations  Carry;Caring for Others;Locomotion Level;Stairs;Squat    Examination-Participation Restrictions  Community Activity;Yard Work    Stability/Clinical Decision Making  Stable/Uncomplicated    Rehab Potential  Good    PT Frequency  3x / week    PT Duration  12 weeks    PT Treatment/Interventions  ADLs/Self Care Home Management;Aquatic Therapy;Biofeedback;Canalith Repostioning;Cryotherapy;Electrical Stimulation;Iontophoresis 4mg /ml Dexamethasone;Moist Heat;Traction;Ultrasound;Gait training;DME Instruction;Stair training;Therapeutic exercise;Therapeutic activities;Balance training;Neuromuscular re-education;Patient/family education;Manual techniques;Passive range of motion;Scar mobilization;Dry needling;Vestibular;Joint Manipulations    PT Next Visit Plan  continue working on knee flexion and extension ROM    PT Home Exercise Plan  Medbridge Access Code: [email or web address], also discussed performing heel prop in seated position in kitchen with foot propped on chair (nothing underneath knee) x 3 min during meal times    Consulted and Agree with Plan of Care  Patient       Patient will benefit from skilled therapeutic intervention in order to improve the following deficits and impairments:  Abnormal gait, Decreased balance, Decreased strength, Difficulty walking, Decreased range of motion, Pain  Visit Diagnosis: Chronic pain of left knee  Muscle weakness (generalized)     Problem List Patient Active Problem List   Diagnosis Date Noted  . Left shoulder pain 12/13/2017  . Pre-diabetes 01/06/2017  . Snoring 04/02/2016  . Osteoarthritis of left knee 12/22/2014  . Peanut allergy   . Acute meniscal tear of knee   . Glaucoma   .  Acute medial meniscal tear   . Right eye injury 11/11/2012   Lynnea Maizes PT, DPT, GCS  Merrin Mcvicker 10/05/2019, 1:34 PM  [facility] MAIN [facility] SERVICES [address] [place], [place], [address] Phone: [phone]   Fax:  [phone]  Name: JAXTEN BROSH MRN: [id number] Date of Birth: 07-21-1966

## 2019-10-07 ENCOUNTER — Other Ambulatory Visit: Payer: Self-pay

## 2019-10-07 ENCOUNTER — Ambulatory Visit

## 2019-10-07 DIAGNOSIS — M6281 Muscle weakness (generalized): Secondary | ICD-10-CM

## 2019-10-07 DIAGNOSIS — G8929 Other chronic pain: Secondary | ICD-10-CM

## 2019-10-07 DIAGNOSIS — M25562 Pain in left knee: Secondary | ICD-10-CM

## 2019-10-07 NOTE — Therapy (Signed)
[facility] MAIN [facility] SERVICES [address] [place], [place], [address] Phone: [phone]   Fax:  [phone]  Physical Therapy Treatment  Patient Details  Name: Alejandro Beltran MRN: [id number] Date of Birth: 1966-08-29 Referring Provider (PT): Dr. Harrison Mons   Encounter Date: 10/07/2019  PT End of Session - 10/07/19 0913    Visit Number  9    Number of Visits  36    Date for PT Re-Evaluation  12/07/19    Authorization Type  Eval: 09/14/19    PT Start Time  0907    PT Stop Time  0945    PT Time Calculation (min)  38 min    Equipment Utilized During Treatment  Gait belt    Activity Tolerance  Patient tolerated treatment well    Behavior During Therapy  [facility] for tasks assessed/performed       Past Medical History:  Diagnosis Date  . Acute medial meniscal tear    left  . Acute meniscal tear of knee   . Allergy   . Glaucoma   . Peanut allergy     Past Surgical History:  Procedure Laterality Date  . EYE SURGERY     rt retina sx  . MENISCUS REPAIR  08/2015    There were no vitals filed for this visit.  Subjective Assessment - 10/07/19 0911    Subjective  Patient reports he got his brace earlier this week after 10-12 minutes starts feeling "it". presented walking with hurrycane AD.    Pertinent History  Pt underwent L TKR 08/15/19. He started OP PT 2 days after surgery at [facility] in [place] however due to the distance of the drive he transferred his care to [facility]. Pt reports that his surgeon would like for him to get therapy 3 days/week and they were only able to schedule him for two days per week so he is transferring his care to [facility]. Pt has been performing his HEP at home which includes static L knee flexion and extension stretches as well as additional LLE strengthening. He continues to utilize his Polar Care as well. Pt had a meniscal repair of L knee in 2014 as well as a meniscal repair to his R knee in 2016.  Prior to surgery he reports pain but fulll L knee range of motion. Other than his surgery pt denies any other recent changes to his health.    Limitations  Walking    Patient Stated Goals  Improve L knee range of motion and strength as well as decrease pain    Currently in Pain?  No/denies             TherEx NuStep L0 for warmup and gentle L knee range of motion, seat position 9 x 4 minutes during history ;  Quantum leg press SLE #60 10x 2 sets with focus on slow controlled muscle activation/sequencing.  (at end if time)  Supine straight leg abduction/adduction at 6" from mat. Focus on keeping knee extended for quad, adductor, and abductor activation. 12x   Bridge with L knee towel slide for flexion; cueing for bridge then knee flexion , lateral musculature guidance for stability. 12x, good speed and control of eccentric portion.   Stool scoot backwards 20 ft, forward 20 ft, x 2 trials each direction, max cueing for body mechanics  Sit to stand with raised plinth table 4" step under RLE, airex pad under LLE, 12x with CGA   TRX squat with chair  behind, occasional Min A to Mod A for equal weight shift, 10x, 2 sets  Manual Therapy  L tibia on femur AP mobilizations with knee bent to available end range flexion, grade I-II 30s/bout x 4 bouts;  Contract relax against PT shoulder with increasing flexion to 90 90 position 10x 5 second holds x 2 sets.   L patella inferior, superior mobilizations to increase L knee flexion, grade I-II, ~ 2 minutes     Pt educated throughout session about proper posture and technique with exercises. Improved exercise technique, movement at target joints, use of target muscles after min to mod verbal, visual, tactile cues.                    PT Education - 10/07/19 0913    Education Details  exercise technique, body mechanics    Person(s) Educated  Patient    Methods  Explanation;Demonstration;Tactile cues;Verbal cues    Comprehension   Verbalized understanding;Returned demonstration;Verbal cues required;Tactile cues required       PT Short Term Goals - 09/14/19 1706      PT SHORT TERM GOAL #1   Title  Pt will be independent with HEP in order to decrease knee pain and increase strength in order to improve pain-free function.    Time  6    Period  Weeks    Status  New    Target Date  10/26/19        PT Long Term Goals - 09/19/19 1439      PT LONG TERM GOAL #1   Title  Pt will increase LEFS by at least 9 points (22/80) in order to demonstrate significant improvement in lower extremity function.    Baseline  09/14/19: to be completed at next appointment 12/28: 13/80    Time  12    Period  Weeks    Status  New    Target Date  12/07/19      PT LONG TERM GOAL #2   Title  Pt will decrease worst pain as reported on NPRS by at least 3 points in order to demonstrate clinically significant reduction in ankle/foot pain.    Baseline  09/14/19: worst: 8/10    Time  12    Period  Weeks    Status  New    Target Date  12/07/19      PT LONG TERM GOAL #3   Title  Pt will increase strength of L hip abduction and adduction by at least 1/2 MMT grade in order to demonstrate improvement in strength and function    Baseline  09/14/19: abduction: 4/5, adduction: 4-/5    Time  12    Period  Weeks    Status  New    Target Date  12/07/19      PT LONG TERM GOAL #4   Title  Pt will improve L knee range of motion to within 5 degrees of neutral extension and greater than 115 degrees of flexion in order to normalize gait and stair ascend/descend    Baseline  09/13/29: lacking 14 degrees extension, 77 degrees flexion    Time  12    Period  Weeks    Status  New    Target Date  12/07/19      PT LONG TERM GOAL #5   Title  Patient will perform the 5x STS without UE support with equal weight acceptance in LE's in <10 seconds to demonstrate functional strength and mobility.    Baseline  12/28: 14 seconds with no uE suppot, RLE  significantly used    Time  12    Period  Weeks    Status  New    Target Date  12/07/18            Plan - 10/07/19 0949    Clinical Impression Statement  Patient arrived late to session limiting session duration. Patient continues to have limitation of L knee flexion both actively and passively however extension is progressing with increasing stability and ability to maintain body weight in standing. Patient is highly motivated throughout physical therapy session. Patient is very challenged with stability on LLE with preference for weight shift onto RLE. Pt will benefit from PT services to address deficits in strength, balance, pain, and mobility in order to return to full function at home and with work.    Personal Factors and Comorbidities  Comorbidity 1;Past/Current Experience    Comorbidities  R knee meniscal repair    Examination-Activity Limitations  Carry;Caring for Others;Locomotion Level;Stairs;Squat    Examination-Participation Restrictions  Community Activity;Yard Work    Stability/Clinical Decision Making  Stable/Uncomplicated    Rehab Potential  Good    PT Frequency  3x / week    PT Duration  12 weeks    PT Treatment/Interventions  ADLs/Self Care Home Management;Aquatic Therapy;Biofeedback;Canalith Repostioning;Cryotherapy;Electrical Stimulation;Iontophoresis 4mg /ml Dexamethasone;Moist Heat;Traction;Ultrasound;Gait training;DME Instruction;Stair training;Therapeutic exercise;Therapeutic activities;Balance training;Neuromuscular re-education;Patient/family education;Manual techniques;Passive range of motion;Scar mobilization;Dry needling;Vestibular;Joint Manipulations    PT Next Visit Plan  continue working on knee flexion and extension ROM    PT Home Exercise Plan  Medbridge Access Code: [email or web address], also discussed performing heel prop in seated position in kitchen with foot propped on chair (nothing underneath knee) x 3 min during meal times    Consulted and Agree with Plan of  Care  Patient       Patient will benefit from skilled therapeutic intervention in order to improve the following deficits and impairments:  Abnormal gait, Decreased balance, Decreased strength, Difficulty walking, Decreased range of motion, Pain  Visit Diagnosis: Chronic pain of left knee  Muscle weakness (generalized)     Problem List Patient Active Problem List   Diagnosis Date Noted  . Left shoulder pain 12/13/2017  . Pre-diabetes 01/06/2017  . Snoring 04/02/2016  . Osteoarthritis of left knee 12/22/2014  . Peanut allergy   . Acute meniscal tear of knee   . Glaucoma   . Acute medial meniscal tear   . Right eye injury 11/11/2012   Janna Arch, PT, DPT   10/07/2019, 9:51 AM  [place] MAIN [facility] SERVICES [address] [place], [place], [address] Phone: [phone]   Fax:  [phone]  Name: Alejandro Beltran MRN: [id number] Date of Birth: 1965/11/17

## 2019-10-10 ENCOUNTER — Other Ambulatory Visit: Payer: Self-pay

## 2019-10-10 ENCOUNTER — Ambulatory Visit

## 2019-10-10 DIAGNOSIS — M6281 Muscle weakness (generalized): Secondary | ICD-10-CM

## 2019-10-10 DIAGNOSIS — M25562 Pain in left knee: Secondary | ICD-10-CM | POA: Diagnosis not present

## 2019-10-10 DIAGNOSIS — G8929 Other chronic pain: Secondary | ICD-10-CM

## 2019-10-10 NOTE — Therapy (Signed)
[place] MAIN [facility] SERVICES [address] [place], [place], [address] Phone: [phone]   Fax:  [phone]  Physical Therapy Progress Note   Dates of reporting period  09/14/19   to   10/10/19  Patient Details  Name: Alejandro Beltran MRN: [id number] Date of Birth: December 29, 1965 Referring Provider (PT): Dr. Chesley Mires   Encounter Date: 10/10/2019  PT End of Session - 10/10/19 0902    Visit Number  10    Number of Visits  36    Date for PT Re-Evaluation  12/07/19    Authorization Type  Eval: 09/14/19    PT Start Time  0856    PT Stop Time  0945    PT Time Calculation (min)  49 min    Equipment Utilized During Treatment  Gait belt    Activity Tolerance  Patient tolerated treatment well    Behavior During Therapy  [facility] for tasks assessed/performed       Past Medical History:  Diagnosis Date  . Acute medial meniscal tear    left  . Acute meniscal tear of knee   . Allergy   . Glaucoma   . Peanut allergy     Past Surgical History:  Procedure Laterality Date  . EYE SURGERY     rt retina sx  . MENISCUS REPAIR  08/2015    There were no vitals filed for this visit.  Subjective Assessment - 10/10/19 0900    Subjective  Patient reports that he is doing well today. He had some sharp peripatellar pain over the weekend but that has improved. He is using his knee flexion brace and states that after approximately 10 minutes he can really feel it stretching. No resting pain upon arrival today.    Pertinent History  Pt underwent L TKR 08/15/19. He started OP PT 2 days after surgery at [facility] in [place] however due to the distance of the drive he transferred his care to [facility]. Pt reports that his surgeon would like for him to get therapy 3 days/week and they were only able to schedule him for two days per week so he is transferring his care to [facility]. Pt has been performing his HEP at home which includes static L knee flexion and  extension stretches as well as additional LLE strengthening. He continues to utilize his Polar Care as well. Pt had a meniscal repair of L knee in 2014 as well as a meniscal repair to his R knee in 2016. Prior to surgery he reports pain but fulll L knee range of motion. Other than his surgery pt denies any other recent changes to his health.    Limitations  Walking    Patient Stated Goals  Improve L knee range of motion and strength as well as decrease pain    Currently in Pain?  No/denies         TREATMENT   Ther-ex NuStep L0 for warmup and gentle L knee range of motion, seat position 10 progressing to 8 x 5 minutesduring history; Standing forward weight shifting with wall flexion arm slides to illicit L quad contraction 5s hold x 10; Standing forward/backward weight shifting with LLE posterior  Goals updated with patient: Pt completed LEFS: 29/80 (unbilled); MMT: L hip adduction: 4-/5, L hip abduction: 4/5; R sidelying L hip abduction x 10; R sidelying L hip clam with manual resistance from therapist x 10; Quantum L single leg press 60# x 20, 75# x 12; L  knee measurements obtained: flexion: 78 degrees, extension: -6 degrees 5TSTS: 11.7s   Manual Therapy  L tibia on femur AP mobilizations with knee bent to available end range flexion, grade III, 30s/bout x 3 bouts; L patella inferior and medial mobilizations to increase L knee flexion, grade III, 30s/bout x 3 bouts each; L femur on tibia AP mobilizations with knee extended, grade I-II 30s/bout x 3 bouts;   Electrical Stimulation, unbilled SupineNMES to L quadwithtowel roll under heel and ice pack on knee.1channel, symmetrical waveform, 300 microsec pulse width, 50hz  rate, 2s ramp, 12s, on, 30s off withlargepads due to difficultywith tolerance of signal with small pads. Pt tolerated intensity of 32 today and is able to achieve a good tetanic quad contraction;   Pt educated throughout session about proper posture  and technique with exercises. Improved exercise technique, movement at target joints, use of target muscles after min to mod verbal, visual, tactile cues.   Pt arrived late today so session was somewhat limited. Outcome measures and goals updated with patient today. His LEFS has improved from 13/80 at initial evaluation to 29/80 today. Hip L hip abduction and adduction strength remains weak and unchanged from initiatl evaluation. His L knee ROM also remains quite limited with extension improving to lacking 6 degrees currently compared to 14 degrees at initial evaluation. His flexion is the most limited and is 78 degrees today compared to 77 degrees at initial evaluation which is essentially unchanged. 5TSTS has improved to 11.7s today compared to 14s at initial evaluation. He was able to increase resistance on the leg press today and has been able to progress to ambulation with a single point cane.Pt will benefit from continued PT services to address deficits in strength, balance,pain,and mobility in order to return to full function at homeand with work.                     PT Short Term Goals - 10/10/19 0910      PT SHORT TERM GOAL #1   Title  Pt will be independent with HEP in order to decrease knee pain and increase strength in order to improve pain-free function.    Time  6    Period  Weeks    Status  On-going    Target Date  10/26/19        PT Long Term Goals - 10/10/19 0910      PT LONG TERM GOAL #1   Title  Pt will increase LEFS to >60/80 in order to demonstrate significant improvement in lower extremity function.    Baseline  09/14/19: to be completed at next appointment 12/28: 13/80; 10/10/19: 29/80    Time  12    Period  Weeks    Status  Revised    Target Date  12/07/19      PT LONG TERM GOAL #2   Title  Pt will decrease worst pain as reported on NPRS by at least 3 points in order to demonstrate clinically significant reduction in ankle/foot pain.     Baseline  09/14/19: worst: 8/10; 10/10/19: 6/10    Time  12    Period  Weeks    Status  Partially Met    Target Date  12/07/19      PT LONG TERM GOAL #3   Title  Pt will increase strength of L hip abduction and adduction by at least 1/2 MMT grade in order to demonstrate improvement in strength and function    Baseline  09/14/19: abduction: 4/5, adduction: 4-/5; 10/10/19: unchanged    Time  12    Period  Weeks    Status  On-going    Target Date  12/07/19      PT LONG TERM GOAL #4   Title  Pt will improve L knee range of motion to within 5 degrees of neutral extension and greater than 115 degrees of flexion in order to normalize gait and stair ascend/descend    Baseline  09/13/29: lacking 14 degrees extension, 77 degrees flexion; 10/10/19: Lacking 6 degrees extension to 78 degrees degrees flexion    Time  12    Period  Weeks    Status  Partially Met    Target Date  12/07/19      PT LONG TERM GOAL #5   Title  Patient will perform the 5x STS without UE support with equal weight acceptance in LE's in <10 seconds to demonstrate functional strength and mobility.    Baseline  12/28: 14 seconds with no uE suppot, RLE significantly used    Time  12    Period  Weeks    Status  New            Plan - 10/10/19 1345    Clinical Impression Statement  Pt arrived late today so session was somewhat limited. Outcome measures and goals updated with patient today. His LEFS has improved from 13/80 at initial evaluation to 29/80 today. Hip L hip abduction and adduction strength remains weak and unchanged from initiatl evaluation. His L knee ROM also remains quite limited with extension improving to lacking 6 degrees currently compared to 14 degrees at initial evaluation. His flexion is the most limited and is 78 degrees today compared to 77 degrees at initial evaluation which is essentially unchanged. 5TSTS has improved to 11.7s today compared to 14s at initial evaluation. He was able to increase  resistance on the leg press today and has been able to progress to ambulation with a single point cane. Pt will benefit from continued PT services to address deficits in strength, balance, pain, and mobility in order to return to full function at home and with work.    Personal Factors and Comorbidities  Comorbidity 1;Past/Current Experience    Comorbidities  R knee meniscal repair    Examination-Activity Limitations  Carry;Caring for Others;Locomotion Level;Stairs;Squat    Examination-Participation Restrictions  Community Activity;Yard Work    Stability/Clinical Decision Making  Stable/Uncomplicated    Rehab Potential  Good    PT Frequency  3x / week    PT Duration  12 weeks    PT Treatment/Interventions  ADLs/Self Care Home Management;Aquatic Therapy;Biofeedback;Canalith Repostioning;Cryotherapy;Electrical Stimulation;Iontophoresis 31m/ml Dexamethasone;Moist Heat;Traction;Ultrasound;Gait training;DME Instruction;Stair training;Therapeutic exercise;Therapeutic activities;Balance training;Neuromuscular re-education;Patient/family education;Manual techniques;Passive range of motion;Scar mobilization;Dry needling;Vestibular;Joint Manipulations    PT Next Visit Plan  continue working on knee flexion and extension ROM    PT Home Exercise Plan  Medbridge Access Code: [email or web address], also discussed performing heel prop in seated position in kitchen with foot propped on chair (nothing underneath knee) x 3 min during meal times    Consulted and Agree with Plan of Care  Patient       Patient will benefit from skilled therapeutic intervention in order to improve the following deficits and impairments:  Abnormal gait, Decreased balance, Decreased strength, Difficulty walking, Decreased range of motion, Pain  Visit Diagnosis: Chronic pain of left knee  Muscle weakness (generalized)     Problem List Patient Active Problem List   Diagnosis Date Noted  .  Left shoulder pain 12/13/2017  . Pre-diabetes  01/06/2017  . Snoring 04/02/2016  . Osteoarthritis of left knee 12/22/2014  . Peanut allergy   . Acute meniscal tear of knee   . Glaucoma   . Acute medial meniscal tear   . Right eye injury 11/11/2012   Phillips Grout PT, DPT, GCS  Emmalie Haigh 10/10/2019, 2:40 PM  [place] MAIN [facility] SERVICES [address] [place], [place], [address] Phone: [phone]   Fax:  [phone]  Name: XAVIOUS SHARRAR MRN: [id number] Date of Birth: Feb 02, 1966

## 2019-10-12 ENCOUNTER — Ambulatory Visit

## 2019-10-12 ENCOUNTER — Other Ambulatory Visit: Payer: Self-pay

## 2019-10-12 DIAGNOSIS — M25562 Pain in left knee: Secondary | ICD-10-CM | POA: Diagnosis not present

## 2019-10-12 DIAGNOSIS — M6281 Muscle weakness (generalized): Secondary | ICD-10-CM

## 2019-10-12 DIAGNOSIS — G8929 Other chronic pain: Secondary | ICD-10-CM

## 2019-10-12 NOTE — Therapy (Signed)
[place] MAIN [facility] SERVICES [address] [place], [place], [address] Phone: [phone]   Fax:  [phone]  Physical Therapy Treatment  Patient Details  Name: Alejandro Beltran MRN: [id number] Date of Birth: 1966-01-08 Referring Provider (PT): Dr. Chesley Mires   Encounter Date: 10/12/2019  PT End of Session - 10/12/19 0902    Visit Number  11    Number of Visits  36    Date for PT Re-Evaluation  12/07/19    Authorization Type  Eval: 09/14/19    PT Start Time  0857    PT Stop Time  0940    PT Time Calculation (min)  43 min    Equipment Utilized During Treatment  Gait belt    Activity Tolerance  Patient tolerated treatment well    Behavior During Therapy  [facility] for tasks assessed/performed       Past Medical History:  Diagnosis Date  . Acute medial meniscal tear    left  . Acute meniscal tear of knee   . Allergy   . Glaucoma   . Peanut allergy     Past Surgical History:  Procedure Laterality Date  . EYE SURGERY     rt retina sx  . MENISCUS REPAIR  08/2015    There were no vitals filed for this visit.  Subjective Assessment - 10/12/19 0901    Subjective  Patient reports that he is doing well today. He did a warm-up this morning and reports that his knee is a little sore today. He rates the knee pain as a 4/10. He is performing his HEP consistently.    Pertinent History  Pt underwent L TKR 08/15/19. He started OP PT 2 days after surgery at [facility] in [place] however due to the distance of the drive he transferred his care to [facility]. Pt reports that his surgeon would like for him to get therapy 3 days/week and they were only able to schedule him for two days per week so he is transferring his care to [facility]. Pt has been performing his HEP at home which includes static L knee flexion and extension stretches as well as additional LLE strengthening. He continues to utilize his Polar Care as well. Pt had a meniscal repair  of L knee in 2014 as well as a meniscal repair to his R knee in 2016. Prior to surgery he reports pain but fulll L knee range of motion. Other than his surgery pt denies any other recent changes to his health.    Limitations  Walking    Patient Stated Goals  Improve L knee range of motion and strength as well as decrease pain    Currently in Pain?  Yes    Pain Score  4     Pain Location  Knee    Pain Orientation  Left    Pain Descriptors / Indicators  Sore    Pain Type  Surgical pain    Pain Onset  More than a month ago         TREATMENT   Ther-ex NuStep L0 for warmup and gentle L knee range of motion,seat position 10 progressing to 8 x 5 minutesduring history; Precor L single leg press 40# x 20, 45# x 10; L knee flexion step stretch 30s hold x 3; Stair training with patient discussing step-to vs step through patterns; 6" forward step ups leading with LLE x 10; 6" L lateral step ups x 10; 6" L SLS eccentric  step down with return up x 10 with single L UE support;   Manual Therapy L tibia on femur AP mobilizations with knee bent to available end range flexion, grade III, 30s/bout x 3 bouts; L patella inferior, superior, and medial mobilizations, grade III, 30s/bout x 3 bouts each; L femur on tibia AP mobilizations with knee extended, grade I-II 30s/bout x3 bouts;   Electrical Stimulation, unbilled SupineNMES to L quadwithtowel roll under heel and ice pack on knee.1channel, symmetrical waveform, 300 microsec pulse width, 50hz  rate, 2s ramp, 12s, on, 30s off withlargepads due to difficultywith tolerance of signal with small pads. Pt tolerated intensity of36today and is able to achieve a good tetanic quad contraction;   Pt educated throughout session about proper posture and technique with exercises. Improved exercise technique, movement at target joints, use of target muscles after min to mod verbal, visual, tactile cues.   Ptarrived late again today  so session was limited. Progressed strengthening and manual therapy today. Continued to utilize NMES to encouraged quad contraction and improve active extension. Pt will benefit from continued PT services to address deficits in strength, balance,pain,and mobility in order to return to full function at homeand with work.                        PT Short Term Goals - 10/10/19 0910      PT SHORT TERM GOAL #1   Title  Pt will be independent with HEP in order to decrease knee pain and increase strength in order to improve pain-free function.    Time  6    Period  Weeks    Status  On-going    Target Date  10/26/19        PT Long Term Goals - 10/10/19 0910      PT LONG TERM GOAL #1   Title  Pt will increase LEFS to >60/80 in order to demonstrate significant improvement in lower extremity function.    Baseline  09/14/19: to be completed at next appointment 12/28: 13/80; 10/10/19: 29/80    Time  12    Period  Weeks    Status  Revised    Target Date  12/07/19      PT LONG TERM GOAL #2   Title  Pt will decrease worst pain as reported on NPRS by at least 3 points in order to demonstrate clinically significant reduction in ankle/foot pain.    Baseline  09/14/19: worst: 8/10; 10/10/19: 6/10    Time  12    Period  Weeks    Status  Partially Met    Target Date  12/07/19      PT LONG TERM GOAL #3   Title  Pt will increase strength of L hip abduction and adduction by at least 1/2 MMT grade in order to demonstrate improvement in strength and function    Baseline  09/14/19: abduction: 4/5, adduction: 4-/5; 10/10/19: unchanged    Time  12    Period  Weeks    Status  On-going    Target Date  12/07/19      PT LONG TERM GOAL #4   Title  Pt will improve L knee range of motion to within 5 degrees of neutral extension and greater than 115 degrees of flexion in order to normalize gait and stair ascend/descend    Baseline  09/13/29: lacking 14 degrees extension, 77 degrees flexion;  10/10/19: Lacking 6 degrees extension to 78 degrees degrees flexion  Time  12    Period  Weeks    Status  Partially Met    Target Date  12/07/19      PT LONG TERM GOAL #5   Title  Patient will perform the 5x STS without UE support with equal weight acceptance in LE's in <10 seconds to demonstrate functional strength and mobility.    Baseline  12/28: 14 seconds with no uE suppot, RLE significantly used    Time  12    Period  Weeks    Status  New            Plan - 10/12/19 4010    Clinical Impression Statement  Pt arrived late again today so session was limited. Progressed strengthening and manual therapy today. Continued to utilize NMES to encouraged quad contraction and improve active extension. Pt will benefit from continued PT services to address deficits in strength, balance, pain, and mobility in order to return to full function at home and with work.    Personal Factors and Comorbidities  Comorbidity 1;Past/Current Experience    Comorbidities  R knee meniscal repair    Examination-Activity Limitations  Carry;Caring for Others;Locomotion Level;Stairs;Squat    Examination-Participation Restrictions  Community Activity;Yard Work    Stability/Clinical Decision Making  Stable/Uncomplicated    Rehab Potential  Good    PT Frequency  3x / week    PT Duration  12 weeks    PT Treatment/Interventions  ADLs/Self Care Home Management;Aquatic Therapy;Biofeedback;Canalith Repostioning;Cryotherapy;Electrical Stimulation;Iontophoresis 23m/ml Dexamethasone;Moist Heat;Traction;Ultrasound;Gait training;DME Instruction;Stair training;Therapeutic exercise;Therapeutic activities;Balance training;Neuromuscular re-education;Patient/family education;Manual techniques;Passive range of motion;Scar mobilization;Dry needling;Vestibular;Joint Manipulations    PT Next Visit Plan  continue working on knee flexion and extension ROM    PT Home Exercise Plan  Medbridge Access Code: [email or web address], also discussed  performing heel prop in seated position in kitchen with foot propped on chair (nothing underneath knee) x 3 min during meal times    Consulted and Agree with Plan of Care  Patient       Patient will benefit from skilled therapeutic intervention in order to improve the following deficits and impairments:  Abnormal gait, Decreased balance, Decreased strength, Difficulty walking, Decreased range of motion, Pain  Visit Diagnosis: Chronic pain of left knee  Muscle weakness (generalized)     Problem List Patient Active Problem List   Diagnosis Date Noted  . Left shoulder pain 12/13/2017  . Pre-diabetes 01/06/2017  . Snoring 04/02/2016  . Osteoarthritis of left knee 12/22/2014  . Peanut allergy   . Acute meniscal tear of knee   . Glaucoma   . Acute medial meniscal tear   . Right eye injury 11/11/2012   JPhillips GroutPT, DPT, GCS  Forestine Macho 10/12/2019, 11:08 AM  [place] [facility] [address] [address]Phone: [phone]  Fax:  [phone] Name: ICARMINO OCAINMRN: [id number] of Birth: [birth year]

## 2019-10-14 ENCOUNTER — Ambulatory Visit

## 2019-10-14 ENCOUNTER — Other Ambulatory Visit: Payer: Self-pay

## 2019-10-14 DIAGNOSIS — M25562 Pain in left knee: Secondary | ICD-10-CM | POA: Diagnosis not present

## 2019-10-14 DIAGNOSIS — G8929 Other chronic pain: Secondary | ICD-10-CM

## 2019-10-14 DIAGNOSIS — M6281 Muscle weakness (generalized): Secondary | ICD-10-CM

## 2019-10-14 NOTE — Therapy (Signed)
[place] MAIN [facility] SERVICES [address] [place], [place], [address] Phone: [phone]   Fax:  [phone]  Physical Therapy Treatment  Patient Details  Name: Alejandro Beltran MRN: [id number] Date of Birth: [birth year] Referring Provider (PT): Dr. Chesley Mires   Encounter Date: 10/14/2019  PT End of Session - 10/14/19 0846    Visit Number  12    Number of Visits  36    Date for PT Re-Evaluation  12/07/19    Authorization Type  Eval: 09/14/19    PT Start Time  0850    PT Stop Time  0945    PT Time Calculation (min)  55 min    Equipment Utilized During Treatment  Gait belt    Activity Tolerance  Patient tolerated treatment well    Behavior During Therapy  [facility] for tasks assessed/performed       Past Medical History:  Diagnosis Date  . Acute medial meniscal tear    left  . Acute meniscal tear of knee   . Allergy   . Glaucoma   . Peanut allergy     Past Surgical History:  Procedure Laterality Date  . EYE SURGERY     rt retina sx  . MENISCUS REPAIR  08/2015    There were no vitals filed for this visit.  Subjective Assessment - 10/14/19 1054    Subjective  Patient reports having a fall last Wednesday night while descending carpeted steps at home. He reports missing a step and landing on bad foot, but was okay following applying ice aferwards. Today he rates his pain at 3/10.    Pertinent History  Pt underwent L TKR 08/15/19. He started OP PT 2 days after surgery at [facility] in [place] however due to the distance of the drive he transferred his care to [facility]. Pt reports that his surgeon would like for him to get therapy 3 days/week and they were only able to schedule him for two days per week so he is transferring his care to [facility]. Pt has been performing his HEP at home which includes static L knee flexion and extension stretches as well as additional LLE strengthening. He continues to utilize his Polar Care as well.  Pt had a meniscal repair of L knee in 2014 as well as a meniscal repair to his R knee in 2016. Prior to surgery he reports pain but fulll L knee range of motion. Other than his surgery pt denies any other recent changes to his health.    Limitations  Walking    Patient Stated Goals  Improve L knee range of motion and strength as well as decrease pain    Currently in Pain?  Yes    Pain Score  3     Pain Location  Knee    Pain Orientation  Left    Pain Descriptors / Indicators  Sore    Pain Type  Surgical pain    Pain Onset  More than a month ago    Pain Frequency  Constant       Manual Therapy: for facilitating L knee mobility and functional ROM L tibiofemoral AP mobilizations, grade 3, 3x 30 sec L patellar inferior, superior, medial mobilizations, grade 3, 3x 30 sec Supine knee flexion MET with progression flexion until 90 90 position, 10x 5 sec holds Ice massage, 4 min  Therapeutic Exercise: Octane bike, 4 min forward rotation to end range available flexion then backward rotation to end range  available flexion, no resistance, UE assistance  Sidelying abduction, 2# ankle weight, 10x LLE; cueing for neutral alignment Cable machine seated adduction strap applied above knee, 7.5#, 10x LLE Precor leg press, single leg LLE 40#, 2x12; cueing for neutral knee alignment Side step in squat position at // bars for upright posture cue, 3x 2 lengths of bars SB wall squats, 10x, for strength and knee flexion     Pt was able to be seen for additional time today due to early arrival at clinic and high motivation. He continues to display improvement in strength as demonstrated in progression of squat type interventions in full weightbearing with decreased compensatory patterning noted. His L knee supine PROM with overpressure was 80 degrees today, measured due to patient's recent fall. Pt would benefit from further from skilled PT intervention in order to address mobility, strength, and balance  deficits in order to return to full function at home and at work.                   PT Education - 10/14/19 0845    Education Details  exercise technique, body mechanics    Person(s) Educated  Patient    Methods  Explanation;Demonstration;Tactile cues;Verbal cues    Comprehension  Verbalized understanding;Returned demonstration;Verbal cues required;Tactile cues required       PT Short Term Goals - 10/10/19 0910      PT SHORT TERM GOAL #1   Title  Pt will be independent with HEP in order to decrease knee pain and increase strength in order to improve pain-free function.    Time  6    Period  Weeks    Status  On-going    Target Date  10/26/19        PT Long Term Goals - 10/10/19 0910      PT LONG TERM GOAL #1   Title  Pt will increase LEFS to >60/80 in order to demonstrate significant improvement in lower extremity function.    Baseline  09/14/19: to be completed at next appointment 12/28: 13/80; 10/10/19: 29/80    Time  12    Period  Weeks    Status  Revised    Target Date  12/07/19      PT LONG TERM GOAL #2   Title  Pt will decrease worst pain as reported on NPRS by at least 3 points in order to demonstrate clinically significant reduction in ankle/foot pain.    Baseline  09/14/19: worst: 8/10; 10/10/19: 6/10    Time  12    Period  Weeks    Status  Partially Met    Target Date  12/07/19      PT LONG TERM GOAL #3   Title  Pt will increase strength of L hip abduction and adduction by at least 1/2 MMT grade in order to demonstrate improvement in strength and function    Baseline  09/14/19: abduction: 4/5, adduction: 4-/5; 10/10/19: unchanged    Time  12    Period  Weeks    Status  On-going    Target Date  12/07/19      PT LONG TERM GOAL #4   Title  Pt will improve L knee range of motion to within 5 degrees of neutral extension and greater than 115 degrees of flexion in order to normalize gait and stair ascend/descend    Baseline  09/13/29: lacking 14  degrees extension, 77 degrees flexion; 10/10/19: Lacking 6 degrees extension to 78 degrees degrees flexion  Time  12    Period  Weeks    Status  Partially Met    Target Date  12/07/19      PT LONG TERM GOAL #5   Title  Patient will perform the 5x STS without UE support with equal weight acceptance in LE's in <10 seconds to demonstrate functional strength and mobility.    Baseline  12/28: 14 seconds with no uE suppot, RLE significantly used    Time  12    Period  Weeks    Status  New            Plan - 10/14/19 1123    Clinical Impression Statement  Pt was able to be seen for additional time today due to early arrival at clinic and high motivation. He continues to display improvement in strength as demonstrated in progression of squat type interventions in full weightbearing with decreased compensatory patterning noted. His L knee supine PROM with overpressure was 80 degrees today, measured due to patient's recent fall. Pt would benefit from further from skilled PT intervention in order to address mobility, strength, and balance deficits in order to return to full function at home and at work.    Personal Factors and Comorbidities  Comorbidity 1;Past/Current Experience    Comorbidities  R knee meniscal repair    Examination-Activity Limitations  Carry;Caring for Others;Locomotion Level;Stairs;Squat    Examination-Participation Restrictions  Community Activity;Yard Work    Stability/Clinical Decision Making  Stable/Uncomplicated    Rehab Potential  Good    PT Frequency  3x / week    PT Duration  12 weeks    PT Treatment/Interventions  ADLs/Self Care Home Management;Aquatic Therapy;Biofeedback;Canalith Repostioning;Cryotherapy;Electrical Stimulation;Iontophoresis 15m/ml Dexamethasone;Moist Heat;Traction;Ultrasound;Gait training;DME Instruction;Stair training;Therapeutic exercise;Therapeutic activities;Balance training;Neuromuscular re-education;Patient/family education;Manual  techniques;Passive range of motion;Scar mobilization;Dry needling;Vestibular;Joint Manipulations    PT Next Visit Plan  continue working on knee flexion and extension ROM    PT Home Exercise Plan  Medbridge Access Code: [email or web address], also discussed performing heel prop in seated position in kitchen with foot propped on chair (nothing underneath knee) x 3 min during meal times    Consulted and Agree with Plan of Care  Patient       Patient will benefit from skilled therapeutic intervention in order to improve the following deficits and impairments:  Abnormal gait, Decreased balance, Decreased strength, Difficulty walking, Decreased range of motion, Pain  Visit Diagnosis: Chronic pain of left knee  Muscle weakness (generalized)     Problem List Patient Active Problem List   Diagnosis Date Noted  . Left shoulder pain 12/13/2017  . Pre-diabetes 01/06/2017  . Snoring 04/02/2016  . Osteoarthritis of left knee 12/22/2014  . Peanut allergy   . Acute meniscal tear of knee   . Glaucoma   . Acute medial meniscal tear   . Right eye injury 11/11/2012   JFlorinda Marker SPT  This entire session was performed under direct supervision and direction of a licensed therapist/therapist assistant . I have personally read, edited and approve of the note as written.  MJanna Arch PT, DPT    10/14/2019, 11:26 AM  [place] [facility] [address] [address]Phone: [phone]  Fax:  [phone] Name: Alejandro TREMONTMRN: [id number] of Birth: 7June 15, 1967

## 2019-10-17 ENCOUNTER — Ambulatory Visit

## 2019-10-17 ENCOUNTER — Other Ambulatory Visit: Payer: Self-pay

## 2019-10-17 DIAGNOSIS — G8929 Other chronic pain: Secondary | ICD-10-CM

## 2019-10-17 DIAGNOSIS — M6281 Muscle weakness (generalized): Secondary | ICD-10-CM

## 2019-10-17 DIAGNOSIS — M25562 Pain in left knee: Secondary | ICD-10-CM | POA: Diagnosis not present

## 2019-10-17 NOTE — Therapy (Signed)
[place] MAIN [facility] SERVICES [address] [place], [place], [address] Phone: [phone]   Fax:  [phone]  Physical Therapy Treatment  Patient Details  Name: Alejandro Beltran MRN: [id number] Date of Birth: 06-25-1966 Referring Provider (PT): Dr. Chesley Mires   Encounter Date: 10/17/2019  PT End of Session - 10/17/19 0851    Visit Number  13    Number of Visits  36    Date for PT Re-Evaluation  12/07/19    Authorization Type  Eval: 09/14/19    PT Start Time  0845    PT Stop Time  0950    PT Time Calculation (min)  65 min    Equipment Utilized During Treatment  Gait belt    Activity Tolerance  Patient tolerated treatment well    Behavior During Therapy  [facility] for tasks assessed/performed       Past Medical History:  Diagnosis Date  . Acute medial meniscal tear    left  . Acute meniscal tear of knee   . Allergy   . Glaucoma   . Peanut allergy     Past Surgical History:  Procedure Laterality Date  . EYE SURGERY     rt retina sx  . MENISCUS REPAIR  08/2015    There were no vitals filed for this visit.  Subjective Assessment - 10/17/19 0849    Subjective  Patient reports that he is doing well today. No pain upon arrival today. No falls since last therapy session. No specific questions or concerns currently.    Pertinent History  Pt underwent L TKR 08/15/19. He started OP PT 2 days after surgery at [facility] in [place] however due to the distance of the drive he transferred his care to [facility]. Pt reports that his surgeon would like for him to get therapy 3 days/week and they were only able to schedule him for two days per week so he is transferring his care to [facility]. Pt has been performing his HEP at home which includes static L knee flexion and extension stretches as well as additional LLE strengthening. He continues to utilize his Polar Care as well. Pt had a meniscal repair of L knee in 2014 as well as a meniscal  repair to his R knee in 2016. Prior to surgery he reports pain but fulll L knee range of motion. Other than his surgery pt denies any other recent changes to his health.    Limitations  Walking    Patient Stated Goals  Improve L knee range of motion and strength as well as decrease pain    Currently in Pain?  No/denies        TREATMENT   Ther-ex NuStep L0 for warmup and gentle L knee range of motion,seat position10 progressing to 8x 6 minutesduring history with therapist adjusting the seat position as appropriate; Precor L single leg press 40# x20, 55# x 16; L knee flexion step stretch 30s hold x 2; L knee HS step stretch 30s hold x 2; 6" forward step ups leading with LLE x 15; 6" L lateral step ups x 15; 6" R lateral cross over step up x 15; L single leg heel raises with BUE support x 15; 6" L SLS eccentric step down with return up x 10 with single L UE support; Side step in squat position in // bars 4 lengths x 2; Prostretch calf stretch in // bars with BUE support 30s x 2; Pball wall squats 15s on/15s  off with RLE slightly extended to bias LLE x 4 cycles; Updated HEP with patient;   Manual Therapy L tibia on femur AP mobilizations with knee bent to available end range flexion, grade III, 30s/bout x 5 bouts; L patella inferior, superior, medial, and lateralmobilizations, grade III, 30s/bout x 2 boutseach; Lfemur on tibiaAP mobilizations with knee extended, grade III 30s/bout x3bouts; L tibia ER mobilizations at end range extension, grade III, 30s/bout x 3 bouts; Seated L knee MET flexion stretch 5s hold/5s stretch x 3, 2 bouts performed and then measurements obtained; L knee measurements obtained: extension: -4 degrees;  flexion: 78 degrees   Electrical Stimulation, unbilled SupineNMES to L quadwithtowel roll under heel andice pack on knee.1channel, symmetrical waveform, 300 microsec pulse width, 50hz rate, 2s ramp, 12s, on, 30s off withlargepads due  to difficultywith tolerance with small pads.Pt tolerated intensity of32today and is able to achieve a good tetanic quad contraction. Pt encouraged to perform active quad contraction during each on phase into extension;   Pt educated throughout session about proper posture and technique with exercises. Improved exercise technique, movement at target joints, use of target muscles after min to mod verbal, visual, tactile cues.   Pt was able to be seen for additional time today due to on time arrival at clinic, high motivation, and schedule opening after his appointment. He continues to display improvement in strength as demonstrated in progression of squat type interventions in full weightbearing with decreased compensatory patterning noted. He is able to tolerate increase in resistance with the L single leg press and demonstrates improved quad contraction during NMES at lower intensity. His L knee AAROM with overpressure by therapist is: -4 to 78 degrees. His knee extension ROM is improving however his flexion remains very limited. Pt would benefit from further from skilled PT intervention in order to address mobility, strength, and balance deficits in order to return to full function at home.                          PT Short Term Goals - 10/10/19 0910      PT SHORT TERM GOAL #1   Title  Pt will be independent with HEP in order to decrease knee pain and increase strength in order to improve pain-free function.    Time  6    Period  Weeks    Status  On-going    Target Date  10/26/19        PT Long Term Goals - 10/10/19 0910      PT LONG TERM GOAL #1   Title  Pt will increase LEFS to >60/80 in order to demonstrate significant improvement in lower extremity function.    Baseline  09/14/19: to be completed at next appointment 12/28: 13/80; 10/10/19: 29/80    Time  12    Period  Weeks    Status  Revised    Target Date  12/07/19      PT LONG TERM GOAL #2    Title  Pt will decrease worst pain as reported on NPRS by at least 3 points in order to demonstrate clinically significant reduction in ankle/foot pain.    Baseline  09/14/19: worst: 8/10; 10/10/19: 6/10    Time  12    Period  Weeks    Status  Partially Met    Target Date  12/07/19      PT LONG TERM GOAL #3   Title  Pt will increase strength of  L hip abduction and adduction by at least 1/2 MMT grade in order to demonstrate improvement in strength and function    Baseline  09/14/19: abduction: 4/5, adduction: 4-/5; 10/10/19: unchanged    Time  12    Period  Weeks    Status  On-going    Target Date  12/07/19      PT LONG TERM GOAL #4   Title  Pt will improve L knee range of motion to within 5 degrees of neutral extension and greater than 115 degrees of flexion in order to normalize gait and stair ascend/descend    Baseline  09/13/29: lacking 14 degrees extension, 77 degrees flexion; 10/10/19: Lacking 6 degrees extension to 78 degrees degrees flexion    Time  12    Period  Weeks    Status  Partially Met    Target Date  12/07/19      PT LONG TERM GOAL #5   Title  Patient will perform the 5x STS without UE support with equal weight acceptance in LE's in <10 seconds to demonstrate functional strength and mobility.    Baseline  12/28: 14 seconds with no uE suppot, RLE significantly used    Time  12    Period  Weeks    Status  New            Plan - 10/17/19 0851    Clinical Impression Statement  Pt was able to be seen for additional time today due to on time arrival at clinic, high motivation, and schedule opening after his appointment. He continues to display improvement in strength as demonstrated in progression of squat type interventions in full weightbearing with decreased compensatory patterning noted. He is able to tolerate increase in resistance with the L single leg press and demonstrates improved quad contraction during NMES at lower intensity. His L knee AAROM with overpressure  by therapist is: -4 to 78 degrees. His knee extension ROM is improving however his flexion remains very limited. Pt would benefit from further from skilled PT intervention in order to address mobility, strength, and balance deficits in order to return to full function at home.    Personal Factors and Comorbidities  Comorbidity 1;Past/Current Experience    Comorbidities  R knee meniscal repair    Examination-Activity Limitations  Carry;Caring for Others;Locomotion Level;Stairs;Squat    Examination-Participation Restrictions  Community Activity;Yard Work    Stability/Clinical Decision Making  Stable/Uncomplicated    Rehab Potential  Good    PT Frequency  3x / week    PT Duration  12 weeks    PT Treatment/Interventions  ADLs/Self Care Home Management;Aquatic Therapy;Biofeedback;Canalith Repostioning;Cryotherapy;Electrical Stimulation;Iontophoresis 90m/ml Dexamethasone;Moist Heat;Traction;Ultrasound;Gait training;DME Instruction;Stair training;Therapeutic exercise;Therapeutic activities;Balance training;Neuromuscular re-education;Patient/family education;Manual techniques;Passive range of motion;Scar mobilization;Dry needling;Vestibular;Joint Manipulations    PT Next Visit Plan  continue working on knee flexion and extension ROM    PT Home Exercise Plan  Medbridge Access Code: [email or web address], also discussed performing heel prop in seated position in kitchen with foot propped on chair (nothing underneath knee) x 3 min during meal times    Consulted and Agree with Plan of Care  Patient       Patient will benefit from skilled therapeutic intervention in order to improve the following deficits and impairments:  Abnormal gait, Decreased balance, Decreased strength, Difficulty walking, Decreased range of motion, Pain  Visit Diagnosis: Chronic pain of left knee  Muscle weakness (generalized)     Problem List Patient Active Problem List   Diagnosis Date Noted  . Left  shoulder pain 12/13/2017  .  Pre-diabetes 01/06/2017  . Snoring 04/02/2016  . Osteoarthritis of left knee 12/22/2014  . Peanut allergy   . Acute meniscal tear of knee   . Glaucoma   . Acute medial meniscal tear   . Right eye injury 11/11/2012   Phillips Grout PT, DPT, GCS  Korri Ask 10/17/2019, 10:03 AM  [place] MAIN [facility] SERVICES [address] [place], [place], [address] Phone: [phone]   Fax:  [phone]  Name: Alejandro Beltran MRN: [id number] Date of Birth: [birth year]

## 2019-10-19 ENCOUNTER — Other Ambulatory Visit: Payer: Self-pay

## 2019-10-19 ENCOUNTER — Ambulatory Visit

## 2019-10-19 DIAGNOSIS — G8929 Other chronic pain: Secondary | ICD-10-CM

## 2019-10-19 DIAGNOSIS — M25562 Pain in left knee: Secondary | ICD-10-CM | POA: Diagnosis not present

## 2019-10-19 DIAGNOSIS — M6281 Muscle weakness (generalized): Secondary | ICD-10-CM

## 2019-10-19 NOTE — Therapy (Signed)
[place] [facility] SERVICES [address] [place], [place], [address] Phone: [phone]   Fax:  [phone]  Physical Therapy Treatment  Patient Details  Name: Alejandro Beltran MRN: [id number] Date of Birth: 11-Jan-1966 Referring Provider (PT): Dr. Chesley Mires   Encounter Date: 10/19/2019  PT End of Session - 10/19/19 0858    Visit Number  14    Number of Visits  36    Date for PT Re-Evaluation  12/07/19    Authorization Type  Eval: 09/14/19    PT Start Time  0855    PT Stop Time  0935    PT Time Calculation (min)  40 min    Equipment Utilized During Treatment  Gait belt    Activity Tolerance  Patient tolerated treatment well    Behavior During Therapy  [facility] for tasks assessed/performed       Past Medical History:  Diagnosis Date  . Acute medial meniscal tear    left  . Acute meniscal tear of knee   . Allergy   . Glaucoma   . Peanut allergy     Past Surgical History:  Procedure Laterality Date  . EYE SURGERY     rt retina sx  . MENISCUS REPAIR  08/2015    There were no vitals filed for this visit.  Subjective Assessment - 10/19/19 0858    Subjective  Patient reports that he is doing well today. No pain upon arrival. No falls since last therapy session. No specific questions or concerns currently.    Pertinent History  Pt underwent L TKR 08/15/19. He started OP PT 2 days after surgery at [facility] in [place] however due to the distance of the drive he transferred his care to [facility]. Pt reports that his surgeon would like for him to get therapy 3 days/week and they were only able to schedule him for two days per week so he is transferring his care to [facility]. Pt has been performing his HEP at home which includes static L knee flexion and extension stretches as well as additional LLE strengthening. He continues to utilize his Polar Care as well. Pt had a meniscal repair of L knee in 2014 as well as a meniscal repair to  his R knee in 2016. Prior to surgery he reports pain but fulll L knee range of motion. Other than his surgery pt denies any other recent changes to his health.    Limitations  Walking    Patient Stated Goals  Improve L knee range of motion and strength as well as decrease pain    Currently in Pain?  No/denies         TREATMENT  Ther-ex NuStep L0 for warmup and gentle L knee range of motion,seat position10 progressing to 8x 5 minutesduring history with therapist adjusting the seat position as appropriate; TRX reverse lunges with LLE forward x 15; PrecorL single leg press 55# 2 x 20; 6" forward step ups leading with LLE x 15; 6" L lateral step ups x 15; 6" R lateral cross over step up x 15; 6" L SLS eccentric step down with return up x 10 with single L UE support; L single leg heel raises with BUE support x 15;L single leg heel raises with BUE support x 15; Side step in squat positionin // bars with red tband around ankles x 2 laps, green tband x 4 laps;   Manual Therapy L tibia on femur AP mobilizations with knee bent  to available end range flexion, grade III, 30s/bout x 3 bouts; L patella inferior, superior,and medialmobilizations,grade III, 30s/bout x 2 boutseach; Lfemur on tibiaAP mobilizations with knee extended, grade III 30s/bout x2bouts; L tibia ER mobilizations at end range extension, grade III, 30s/bout x 2 bouts;   Electrical Stimulation, unbilled SupineNMES to L quadwithtowel roll under heel andice pack on knee.1channel, symmetrical waveform, 300 microsec pulse width, 50hz rate, 2s ramp, 12s, on, 30s off withlargepads due to difficultywith tolerance with small pads.Pt tolerated intensity of32today and is able to achieve a good tetanic quad contraction. Pt encouraged to perform active quad contraction into extension during each "on" phase;   Pt educated throughout session about proper posture and technique with exercises. Improved  exercise technique, movement at target joints, use of target muscles after min to mod verbal, visual, tactile cues.   Pt demonstrates high motivation during session today. He continues to display improvement in strength with increase in resistance with the L single leg press. He also continues to demonstrate improved quad contraction during NMES. He continues to demonstrates improved stability during step-up/downs. Pt would benefit from further from skilled PT intervention in order to address mobility, strength, and balance deficits in order to return to full function at home.                        PT Short Term Goals - 10/10/19 0910      PT SHORT TERM GOAL #1   Title  Pt will be independent with HEP in order to decrease knee pain and increase strength in order to improve pain-free function.    Time  6    Period  Weeks    Status  On-going    Target Date  10/26/19        PT Long Term Goals - 10/10/19 0910      PT LONG TERM GOAL #1   Title  Pt will increase LEFS to >60/80 in order to demonstrate significant improvement in lower extremity function.    Baseline  09/14/19: to be completed at next appointment 12/28: 13/80; 10/10/19: 29/80    Time  12    Period  Weeks    Status  Revised    Target Date  12/07/19      PT LONG TERM GOAL #2   Title  Pt will decrease worst pain as reported on NPRS by at least 3 points in order to demonstrate clinically significant reduction in ankle/foot pain.    Baseline  09/14/19: worst: 8/10; 10/10/19: 6/10    Time  12    Period  Weeks    Status  Partially Met    Target Date  12/07/19      PT LONG TERM GOAL #3   Title  Pt will increase strength of L hip abduction and adduction by at least 1/2 MMT grade in order to demonstrate improvement in strength and function    Baseline  09/14/19: abduction: 4/5, adduction: 4-/5; 10/10/19: unchanged    Time  12    Period  Weeks    Status  On-going    Target Date  12/07/19      PT LONG  TERM GOAL #4   Title  Pt will improve L knee range of motion to within 5 degrees of neutral extension and greater than 115 degrees of flexion in order to normalize gait and stair ascend/descend    Baseline  09/13/29: lacking 14 degrees extension, 77 degrees flexion; 10/10/19: Lacking 6  degrees extension to 78 degrees degrees flexion    Time  12    Period  Weeks    Status  Partially Met    Target Date  12/07/19      PT LONG TERM GOAL #5   Title  Patient will perform the 5x STS without UE support with equal weight acceptance in LE's in <10 seconds to demonstrate functional strength and mobility.    Baseline  12/28: 14 seconds with no uE suppot, RLE significantly used    Time  12    Period  Weeks    Status  New            Plan - 10/19/19 0859    Clinical Impression Statement  Pt demonstrates high motivation during session today. He continues to display improvement in strength with increase in resistance with the L single leg press. He also continues to demonstrate improved quad contraction during NMES. He continues to demonstrates improved stability during step-up/downs. Pt would benefit from further from skilled PT intervention in order to address mobility, strength, and balance deficits in order to return to full function at home.    Personal Factors and Comorbidities  Comorbidity 1;Past/Current Experience    Comorbidities  R knee meniscal repair    Examination-Activity Limitations  Carry;Caring for Others;Locomotion Level;Stairs;Squat    Examination-Participation Restrictions  Community Activity;Yard Work    Stability/Clinical Decision Making  Stable/Uncomplicated    Rehab Potential  Good    PT Frequency  3x / week    PT Duration  12 weeks    PT Treatment/Interventions  ADLs/Self Care Home Management;Aquatic Therapy;Biofeedback;Canalith Repostioning;Cryotherapy;Electrical Stimulation;Iontophoresis 72m/ml Dexamethasone;Moist Heat;Traction;Ultrasound;Gait training;DME Instruction;Stair  training;Therapeutic exercise;Therapeutic activities;Balance training;Neuromuscular re-education;Patient/family education;Manual techniques;Passive range of motion;Scar mobilization;Dry needling;Vestibular;Joint Manipulations    PT Next Visit Plan  continue working on knee flexion and extension ROM    PT Home Exercise Plan  Medbridge Access Code: [email or web address], also discussed performing heel prop in seated position in kitchen with foot propped on chair (nothing underneath knee) x 3 min during meal times    Consulted and Agree with Plan of Care  Patient       Patient will benefit from skilled therapeutic intervention in order to improve the following deficits and impairments:  Abnormal gait, Decreased balance, Decreased strength, Difficulty walking, Decreased range of motion, Pain  Visit Diagnosis: Chronic pain of left knee  Muscle weakness (generalized)     Problem List Patient Active Problem List   Diagnosis Date Noted  . Left shoulder pain 12/13/2017  . Pre-diabetes 01/06/2017  . Snoring 04/02/2016  . Osteoarthritis of left knee 12/22/2014  . Peanut allergy   . Acute meniscal tear of knee   . Glaucoma   . Acute medial meniscal tear   . Right eye injury 11/11/2012   JPhillips GroutPT, DPT, GCS  Huprich,Jason 10/19/2019, 1:17 PM  [place] [address] [address]Phone: [phone]  Fax:  [phone] Name: Alejandro BRANCATOMRN: [id number] of Birth: 7Jul 13, 1967

## 2019-10-21 ENCOUNTER — Other Ambulatory Visit: Payer: Self-pay

## 2019-10-21 ENCOUNTER — Ambulatory Visit

## 2019-10-21 DIAGNOSIS — G8929 Other chronic pain: Secondary | ICD-10-CM

## 2019-10-21 DIAGNOSIS — M25562 Pain in left knee: Secondary | ICD-10-CM | POA: Diagnosis not present

## 2019-10-21 DIAGNOSIS — M6281 Muscle weakness (generalized): Secondary | ICD-10-CM

## 2019-10-21 NOTE — Therapy (Signed)
[place] MAIN [facility] SERVICES [address] [place], [place], [address] Phone: [phone]   Fax:  [phone]  Physical Therapy Treatment  Patient Details  Name: Alejandro Beltran MRN: [id number] Date of Birth: October 13, 1965 Referring Provider (PT): Dr. Chesley Mires   Encounter Date: 10/21/2019  PT End of Session - 10/21/19 1107    Visit Number  15    Number of Visits  36    Date for PT Re-Evaluation  12/07/19    Authorization Type  Eval: 09/14/19    PT Start Time  0952    PT Stop Time  1025    PT Time Calculation (min)  33 min    Equipment Utilized During Treatment  Gait belt    Behavior During Therapy  [facility] for tasks assessed/performed       Past Medical History:  Diagnosis Date  . Acute medial meniscal tear    left  . Acute meniscal tear of knee   . Allergy   . Glaucoma   . Peanut allergy     Past Surgical History:  Procedure Laterality Date  . EYE SURGERY     rt retina sx  . MENISCUS REPAIR  08/2015    There were no vitals filed for this visit.  Subjective Assessment - 10/21/19 1038    Subjective  Patient reports no pain today. No falls or LOB since last therapy session.    Pertinent History  Pt underwent L TKR 08/15/19. He started OP PT 2 days after surgery at [facility] in [place] however due to the distance of the drive he transferred his care to [facility]. Pt reports that his surgeon would like for him to get therapy 3 days/week and they were only able to schedule him for two days per week so he is transferring his care to [facility]. Pt has been performing his HEP at home which includes static L knee flexion and extension stretches as well as additional LLE strengthening. He continues to utilize his Polar Care as well. Pt had a meniscal repair of L knee in 2014 as well as a meniscal repair to his R knee in 2016. Prior to surgery he reports pain but fulll L knee range of motion. Other than his surgery pt denies any other  recent changes to his health.    Limitations  Walking    Patient Stated Goals  Improve L knee range of motion and strength as well as decrease pain    Currently in Pain?  No/denies    Pain Score  --    Pain Onset  --        TREATMENT  Manual Therapy: to increase L knee ROM L tibia on femur AP mobilizations, Grade III, 3x30 sec, knee bent to available end range flexion L patellar mobilizations (superior, inferior, medial), Grade III, 2x30 sec each   Therapeutic Exercise: for strength and stabilization Octane bike half cycles back and forth to end range available flexion, 4 min, no resistance, for warmup and L knee ROM Squat with L knee TKE, BTB, 2x10, pt placed more weight on RLE, cueing for quad activation Tandem stance balance with forward foot on yellow dyna disc, 2x each LE forward, no UE support, for LE stabilization and weight distribution Precor L single leg press, 40#, 3x12    Pt educated throughout session about proper posture and technique with exercises. Improved exercise technique, movement at target joints, use of target muscles after min to mod verbal, visual, tactile  cues.     Pt had shorter session today due to late arrival at clinic, but presented with high motivation. He continues to display improvement in stability on LLE, as he was able to perform a tandem stance task with an unstable surface. Pt continues to be challenged by equal weight LE distribution. Pt would benefit from further skilled PT intervention to improve mobility, strength, and equal LE weight acceptance in order to return to full function at home and work.                     PT Education - 10/21/19 1028    Education Details  exercise technique, body mechanics    Person(s) Educated  Patient    Methods  Explanation;Demonstration;Tactile cues;Verbal cues    Comprehension  Verbalized understanding;Returned demonstration;Verbal cues required;Tactile cues required       PT Short  Term Goals - 10/10/19 0910      PT SHORT TERM GOAL #1   Title  Pt will be independent with HEP in order to decrease knee pain and increase strength in order to improve pain-free function.    Time  6    Period  Weeks    Status  On-going    Target Date  10/26/19        PT Long Term Goals - 10/10/19 0910      PT LONG TERM GOAL #1   Title  Pt will increase LEFS to >60/80 in order to demonstrate significant improvement in lower extremity function.    Baseline  09/14/19: to be completed at next appointment 12/28: 13/80; 10/10/19: 29/80    Time  12    Period  Weeks    Status  Revised    Target Date  12/07/19      PT LONG TERM GOAL #2   Title  Pt will decrease worst pain as reported on NPRS by at least 3 points in order to demonstrate clinically significant reduction in ankle/foot pain.    Baseline  09/14/19: worst: 8/10; 10/10/19: 6/10    Time  12    Period  Weeks    Status  Partially Met    Target Date  12/07/19      PT LONG TERM GOAL #3   Title  Pt will increase strength of L hip abduction and adduction by at least 1/2 MMT grade in order to demonstrate improvement in strength and function    Baseline  09/14/19: abduction: 4/5, adduction: 4-/5; 10/10/19: unchanged    Time  12    Period  Weeks    Status  On-going    Target Date  12/07/19      PT LONG TERM GOAL #4   Title  Pt will improve L knee range of motion to within 5 degrees of neutral extension and greater than 115 degrees of flexion in order to normalize gait and stair ascend/descend    Baseline  09/13/29: lacking 14 degrees extension, 77 degrees flexion; 10/10/19: Lacking 6 degrees extension to 78 degrees degrees flexion    Time  12    Period  Weeks    Status  Partially Met    Target Date  12/07/19      PT LONG TERM GOAL #5   Title  Patient will perform the 5x STS without UE support with equal weight acceptance in LE's in <10 seconds to demonstrate functional strength and mobility.    Baseline  12/28: 14 seconds with no  uE suppot, RLE significantly used  Time  12    Period  Weeks    Status  New            Plan - 10/21/19 1136    Clinical Impression Statement  Pt had shorter session today due to late arrival at clinic, but presented with high motivation. He continues to display improvement in stability on LLE, as he was able to perform a tandem stance task with an unstable surface. Pt continues to be challenged by equal weight LE distribution. Pt would benefit from further skilled PT intervention to improve mobility, strength, and equal LE weight acceptance in order to return to full function at home and work.    Personal Factors and Comorbidities  Comorbidity 1;Past/Current Experience    Comorbidities  R knee meniscal repair    Examination-Activity Limitations  Carry;Caring for Others;Locomotion Level;Stairs;Squat    Examination-Participation Restrictions  Community Activity;Yard Work    Stability/Clinical Decision Making  Stable/Uncomplicated    Rehab Potential  Good    PT Frequency  3x / week    PT Duration  12 weeks    PT Treatment/Interventions  ADLs/Self Care Home Management;Aquatic Therapy;Biofeedback;Canalith Repostioning;Cryotherapy;Electrical Stimulation;Iontophoresis 14m/ml Dexamethasone;Moist Heat;Traction;Ultrasound;Gait training;DME Instruction;Stair training;Therapeutic exercise;Therapeutic activities;Balance training;Neuromuscular re-education;Patient/family education;Manual techniques;Passive range of motion;Scar mobilization;Dry needling;Vestibular;Joint Manipulations    PT Next Visit Plan  continue working on knee flexion and extension ROM    PT Home Exercise Plan  Medbridge Access Code: [email or web address], also discussed performing heel prop in seated position in kitchen with foot propped on chair (nothing underneath knee) x 3 min during meal times    Consulted and Agree with Plan of Care  Patient       Patient will benefit from skilled therapeutic intervention in order to improve the  following deficits and impairments:  Abnormal gait, Decreased balance, Decreased strength, Difficulty walking, Decreased range of motion, Pain  Visit Diagnosis: Chronic pain of left knee  Muscle weakness (generalized)     Problem List Patient Active Problem List   Diagnosis Date Noted  . Left shoulder pain 12/13/2017  . Pre-diabetes 01/06/2017  . Snoring 04/02/2016  . Osteoarthritis of left knee 12/22/2014  . Peanut allergy   . Acute meniscal tear of knee   . Glaucoma   . Acute medial meniscal tear   . Right eye injury 11/11/2012    This entire session was performed under direct supervision and direction of a licensed therapist/therapist assistant . I have personally read, edited and approve of the note as written.   JPhillips GroutPT, DPT, GCS  JFlorinda Marker SPT 10/21/2019, 11:44 AM  [place]MAIN RBay[facility] HospitalSERVICES [address] [address]Phone: [phone]  Fax:  [phone] Name: IJOURDYN FERRINMRN: [id number] of Birth: [birth year]

## 2019-10-24 ENCOUNTER — Ambulatory Visit: Attending: Nurse Practitioner

## 2019-10-24 ENCOUNTER — Other Ambulatory Visit: Payer: Self-pay

## 2019-10-24 DIAGNOSIS — M25562 Pain in left knee: Secondary | ICD-10-CM | POA: Diagnosis present

## 2019-10-24 DIAGNOSIS — M6281 Muscle weakness (generalized): Secondary | ICD-10-CM | POA: Insufficient documentation

## 2019-10-24 DIAGNOSIS — G8929 Other chronic pain: Secondary | ICD-10-CM | POA: Insufficient documentation

## 2019-10-24 NOTE — Therapy (Signed)
[place] MAIN [facility] SERVICES [address] [place], [place], [address] Phone: [phone]   Fax:  [phone]  Physical Therapy Treatment  Patient Details  Name: Alejandro Beltran MRN: [id number] Date of Birth: February 21, 1966 Referring Provider (PT): Dr. Chesley Mires   Encounter Date: 10/24/2019  PT End of Session - 10/24/19 1153    Visit Number  16    Number of Visits  36    Date for PT Re-Evaluation  12/07/19    Authorization Type  Eval: 09/14/19    PT Start Time  1145    PT Stop Time  1230    PT Time Calculation (min)  45 min    Equipment Utilized During Treatment  Gait belt    Activity Tolerance  Patient tolerated treatment well    Behavior During Therapy  [facility] for tasks assessed/performed       Past Medical History:  Diagnosis Date  . Acute medial meniscal tear    left  . Acute meniscal tear of knee   . Allergy   . Glaucoma   . Peanut allergy     Past Surgical History:  Procedure Laterality Date  . EYE SURGERY     rt retina sx  . MENISCUS REPAIR  08/2015    There were no vitals filed for this visit.  Subjective Assessment - 10/24/19 1152    Subjective  Patient reports 3/10 L knee pain to. No falls or LOB since last therapy session.    Pertinent History  Pt underwent L TKR 08/15/19. He started OP PT 2 days after surgery at [facility] in [place] however due to the distance of the drive he transferred his care to [facility]. Pt reports that his surgeon would like for him to get therapy 3 days/week and they were only able to schedule him for two days per week so he is transferring his care to [facility]. Pt has been performing his HEP at home which includes static L knee flexion and extension stretches as well as additional LLE strengthening. He continues to utilize his Polar Care as well. Pt had a meniscal repair of L knee in 2014 as well as a meniscal repair to his R knee in 2016. Prior to surgery he reports pain but fulll L knee  range of motion. Other than his surgery pt denies any other recent changes to his health.    Limitations  Walking    Patient Stated Goals  Improve L knee range of motion and strength as well as decrease pain    Currently in Pain?  Yes    Pain Score  2     Pain Location  Knee    Pain Orientation  Left    Pain Descriptors / Indicators  Aching    Pain Type  Chronic pain    Pain Onset  More than a month ago         TREATMENT   Ther-ex Octane bike half cycles back and forth to end range available flexion, 4 min, no resistance, for warmup and L knee ROM; PrecorL single leg press 40# x 20, 55# x 20; Precor L single heel raise 40# x 20; Seated L LAQ with manual resistance from therapist 2 x 10; TRX reverse lunges with LLE forward x 15; TRX L lateral lunges x 15; Monster walks with green tband around ankles 30' x 2; Side step in squat positionin // bars with green band around ankles x 30' each direction;  Manual Therapy L tibia on femur AP mobilizations with knee bent to available end range flexion, grade III, 30s/bout x3bouts; L patella inferior and superiorgrade III, 30s/bout x3boutseach; Lfemur on tibiaAP mobilizations with knee extended, grade III30s/bout x3bouts; L knee extension stretch with active quad set and overpressure by therapist 10s hold x 3; L knee flexion MET stretch 5s hold, 5s relax x 3; L knee AAROM with overpressure: extension: -4, flexion: 78 degrees;   Pt educated throughout session about proper posture and technique with exercises. Improved exercise technique, movement at target joints, use of target muscles after min to mod verbal, visual, tactile cues.   Pt demonstrateshigh motivation during session today. Continued to progress LE strengthening with lunges and resisted gait today. Also continued with manual therapy for L knee ROM however he remains very limited in flexion despite extensive attention to range of motion. His L knee AAROM  with overpressure by therapist is -4 (lacking from neutral) to 78 degrees. He has a follow-up appointment with orthopedics tomorrow.Pt would benefit from further from skilled PT intervention in order to address mobility, strength, and balance deficits in order to return to full function at home.                         PT Short Term Goals - 10/10/19 0910      PT SHORT TERM GOAL #1   Title  Pt will be independent with HEP in order to decrease knee pain and increase strength in order to improve pain-free function.    Time  6    Period  Weeks    Status  On-going    Target Date  10/26/19        PT Long Term Goals - 10/10/19 0910      PT LONG TERM GOAL #1   Title  Pt will increase LEFS to >60/80 in order to demonstrate significant improvement in lower extremity function.    Baseline  09/14/19: to be completed at next appointment 12/28: 13/80; 10/10/19: 29/80    Time  12    Period  Weeks    Status  Revised    Target Date  12/07/19      PT LONG TERM GOAL #2   Title  Pt will decrease worst pain as reported on NPRS by at least 3 points in order to demonstrate clinically significant reduction in ankle/foot pain.    Baseline  09/14/19: worst: 8/10; 10/10/19: 6/10    Time  12    Period  Weeks    Status  Partially Met    Target Date  12/07/19      PT LONG TERM GOAL #3   Title  Pt will increase strength of L hip abduction and adduction by at least 1/2 MMT grade in order to demonstrate improvement in strength and function    Baseline  09/14/19: abduction: 4/5, adduction: 4-/5; 10/10/19: unchanged    Time  12    Period  Weeks    Status  On-going    Target Date  12/07/19      PT LONG TERM GOAL #4   Title  Pt will improve L knee range of motion to within 5 degrees of neutral extension and greater than 115 degrees of flexion in order to normalize gait and stair ascend/descend    Baseline  09/13/29: lacking 14 degrees extension, 77 degrees flexion; 10/10/19: Lacking 6  degrees extension to 78 degrees degrees flexion    Time  12  Period  Weeks    Status  Partially Met    Target Date  12/07/19      PT LONG TERM GOAL #5   Title  Patient will perform the 5x STS without UE support with equal weight acceptance in LE's in <10 seconds to demonstrate functional strength and mobility.    Baseline  12/28: 14 seconds with no uE suppot, RLE significantly used    Time  12    Period  Weeks    Status  New            Plan - 10/24/19 1153    Clinical Impression Statement  Pt demonstrates high motivation during session today. Continued to progress LE strengthening with lunges and resisted gait today. Also continued with manual therapy for L knee ROM however he remains very limited in flexion despite extensive attention to range of motion. His L knee AAROM with overpressure by therapist is -4 (lacking from neutral) to 78 degrees. He has a follow-up appointment with orthopedics tomorrow. Pt would benefit from further from skilled PT intervention in order to address mobility, strength, and balance deficits in order to return to full function at home.    Personal Factors and Comorbidities  Comorbidity 1;Past/Current Experience    Comorbidities  R knee meniscal repair    Examination-Activity Limitations  Carry;Caring for Others;Locomotion Level;Stairs;Squat    Examination-Participation Restrictions  Community Activity;Yard Work    Stability/Clinical Decision Making  Stable/Uncomplicated    Rehab Potential  Good    PT Frequency  3x / week    PT Duration  12 weeks    PT Treatment/Interventions  ADLs/Self Care Home Management;Aquatic Therapy;Biofeedback;Canalith Repostioning;Cryotherapy;Electrical Stimulation;Iontophoresis 8m/ml Dexamethasone;Moist Heat;Traction;Ultrasound;Gait training;DME Instruction;Stair training;Therapeutic exercise;Therapeutic activities;Balance training;Neuromuscular re-education;Patient/family education;Manual techniques;Passive range of motion;Scar  mobilization;Dry needling;Vestibular;Joint Manipulations    PT Next Visit Plan  continue working on knee flexion and extension ROM    PT Home Exercise Plan  Medbridge Access Code: [email or web address], also discussed performing heel prop in seated position in kitchen with foot propped on chair (nothing underneath knee) x 3 min during meal times    Consulted and Agree with Plan of Care  Patient       Patient will benefit from skilled therapeutic intervention in order to improve the following deficits and impairments:  Abnormal gait, Decreased balance, Decreased strength, Difficulty walking, Decreased range of motion, Pain  Visit Diagnosis: Chronic pain of left knee  Muscle weakness (generalized)     Problem List Patient Active Problem List   Diagnosis Date Noted  . Left shoulder pain 12/13/2017  . Pre-diabetes 01/06/2017  . Snoring 04/02/2016  . Osteoarthritis of left knee 12/22/2014  . Peanut allergy   . Acute meniscal tear of knee   . Glaucoma   . Acute medial meniscal tear   . Right eye injury 11/11/2012   JPhillips GroutPT, DPT, GCS  Costantino Kohlbeck 10/24/2019, 1:22 PM  [place] [address] [address]Phone: [phone]  Fax:  [phone] Name: Alejandro WANDLERMRN: [id number] of Birth: 706-Mar-1967

## 2019-10-25 ENCOUNTER — Other Ambulatory Visit: Payer: Self-pay

## 2019-10-25 ENCOUNTER — Ambulatory Visit

## 2019-10-25 DIAGNOSIS — G8929 Other chronic pain: Secondary | ICD-10-CM

## 2019-10-25 DIAGNOSIS — M25562 Pain in left knee: Secondary | ICD-10-CM | POA: Diagnosis not present

## 2019-10-25 DIAGNOSIS — M6281 Muscle weakness (generalized): Secondary | ICD-10-CM

## 2019-10-25 NOTE — Therapy (Signed)
[place] MAIN [facility] SERVICES [address] [place], [place], [address] Phone: [phone]   Fax:  [phone]  Physical Therapy Treatment  Patient Details  Name: Alejandro Beltran MRN: [id number] Date of Birth: 21-Sep-1966 Referring Provider (PT): Dr. Chesley Mires   Encounter Date: 10/25/2019  PT End of Session - 10/25/19 1411    Visit Number  17    Number of Visits  36    Date for PT Re-Evaluation  12/07/19    Authorization Type  Eval: 09/14/19    PT Start Time  1400    PT Stop Time  1430    PT Time Calculation (min)  30 min    Equipment Utilized During Treatment  Gait belt    Activity Tolerance  Patient tolerated treatment well    Behavior During Therapy  [facility] for tasks assessed/performed       Past Medical History:  Diagnosis Date  . Acute medial meniscal tear    left  . Acute meniscal tear of knee   . Allergy   . Glaucoma   . Peanut allergy     Past Surgical History:  Procedure Laterality Date  . EYE SURGERY     rt retina sx  . MENISCUS REPAIR  08/2015    There were no vitals filed for this visit.  Subjective Assessment - 10/25/19 1407    Subjective  Patient denies any knee pain upon arrival today. No falls or LOB since last therapy session. He saw his surgeon this morning who scheduled a closed manipulation for next Wednesday 11/01/18.    Pertinent History  Pt underwent L TKR 08/15/19. He started OP PT 2 days after surgery at [facility] in [place] however due to the distance of the drive he transferred his care to [facility]. Pt reports that his surgeon would like for him to get therapy 3 days/week and they were only able to schedule him for two days per week so he is transferring his care to [facility]. Pt has been performing his HEP at home which includes static L knee flexion and extension stretches as well as additional LLE strengthening. He continues to utilize his Polar Care as well. Pt had a meniscal repair of L knee  in 2014 as well as a meniscal repair to his R knee in 2016. Prior to surgery he reports pain but fulll L knee range of motion. Other than his surgery pt denies any other recent changes to his health.    Limitations  Walking    Patient Stated Goals  Improve L knee range of motion and strength as well as decrease pain    Currently in Pain?  No/denies    Pain Onset  --           TREATMENT    Ther-ex NuStep L0 for warmup and gentle L knee range of motion,seat position10 progressing to [id number] historywith therapist adjusting the seat position as appropriate; PrecorL single leg press 45# x 20, 55# x 20; 6" forward step ups leading with LLE x 15; 6" L lateral step ups x 15; 6" R lateral cross over step up x 15; 6" L SLS eccentric step down with heel tap and return x 10 with single L UE support; Pball L single leg wall squat 2 x 10; L single leg golfer's lift x 15 with intermittent 2 finger support with LUE;   Manual Therapy L patella inferior and superior mobilizationsgrade III, 30s/bout x2boutseach; L patella medial and lateral  mobilizations grade III, 30s/bout x2boutseach; L knee extension stretch with active quad set and overpressure by therapist 10s hold x 3; L knee flexion MET stretch 5s hold, 5s relax x 3;   Pt educated throughout session about proper posture and technique with exercises. Improved exercise technique, movement at target joints, use of target muscles after min to mod verbal, visual, tactile cues.   Ptarrived late to session so it was abbreviated accordingly. However he demonstrateshigh motivationduring session today. Continued to progress LE strengthening with pt demonstrating both improved strength and stability in LLE. Also continued with manual therapy for L knee including MET stretches. Pt advised to bring in his knee flexion brace at next session to look at adjusting to increase the range. He has a closed manipulation  scheduled for next Wednesday 11/02/19 and has a PT appointment scheduled for the following day.Pt would benefit from further from skilled PT intervention in order to address mobility, strength, and balance deficits in order to return to full function at home.                        PT Short Term Goals - 10/10/19 0910      PT SHORT TERM GOAL #1   Title  Pt will be independent with HEP in order to decrease knee pain and increase strength in order to improve pain-free function.    Time  6    Period  Weeks    Status  On-going    Target Date  10/26/19        PT Long Term Goals - 10/10/19 0910      PT LONG TERM GOAL #1   Title  Pt will increase LEFS to >60/80 in order to demonstrate significant improvement in lower extremity function.    Baseline  09/14/19: to be completed at next appointment 12/28: 13/80; 10/10/19: 29/80    Time  12    Period  Weeks    Status  Revised    Target Date  12/07/19      PT LONG TERM GOAL #2   Title  Pt will decrease worst pain as reported on NPRS by at least 3 points in order to demonstrate clinically significant reduction in ankle/foot pain.    Baseline  09/14/19: worst: 8/10; 10/10/19: 6/10    Time  12    Period  Weeks    Status  Partially Met    Target Date  12/07/19      PT LONG TERM GOAL #3   Title  Pt will increase strength of L hip abduction and adduction by at least 1/2 MMT grade in order to demonstrate improvement in strength and function    Baseline  09/14/19: abduction: 4/5, adduction: 4-/5; 10/10/19: unchanged    Time  12    Period  Weeks    Status  On-going    Target Date  12/07/19      PT LONG TERM GOAL #4   Title  Pt will improve L knee range of motion to within 5 degrees of neutral extension and greater than 115 degrees of flexion in order to normalize gait and stair ascend/descend    Baseline  09/13/29: lacking 14 degrees extension, 77 degrees flexion; 10/10/19: Lacking 6 degrees extension to 78 degrees degrees  flexion    Time  12    Period  Weeks    Status  Partially Met    Target Date  12/07/19      PT LONG  TERM GOAL #5   Title  Patient will perform the 5x STS without UE support with equal weight acceptance in LE's in <10 seconds to demonstrate functional strength and mobility.    Baseline  12/28: 14 seconds with no uE suppot, RLE significantly used    Time  12    Period  Weeks    Status  New            Plan - 10/25/19 1411    Clinical Impression Statement  Pt arrived late to session so it was abbreviated accordingly. However he demonstrates high motivation during session today. Continued to progress LE strengthening with pt demonstrating both improved strength and stability in LLE. Also continued with manual therapy for L knee including MET stretches. Pt advised to bring in his knee flexion brace at next session to look at adjusting to increase the range. He has a closed manipulation scheduled for next Wednesday 11/02/19 and has a PT appointment scheduled for the following day. Pt would benefit from further from skilled PT intervention in order to address mobility, strength, and balance deficits in order to return to full function at home.    Personal Factors and Comorbidities  Comorbidity 1;Past/Current Experience    Comorbidities  R knee meniscal repair    Examination-Activity Limitations  Carry;Caring for Others;Locomotion Level;Stairs;Squat    Examination-Participation Restrictions  Community Activity;Yard Work    Stability/Clinical Decision Making  Stable/Uncomplicated    Clinical Decision Making  Moderate    Rehab Potential  Good    PT Frequency  3x / week    PT Duration  12 weeks    PT Treatment/Interventions  ADLs/Self Care Home Management;Aquatic Therapy;Biofeedback;Canalith Repostioning;Cryotherapy;Electrical Stimulation;Iontophoresis 65m/ml Dexamethasone;Moist Heat;Traction;Ultrasound;Gait training;DME Instruction;Stair training;Therapeutic exercise;Therapeutic activities;Balance  training;Neuromuscular re-education;Patient/family education;Manual techniques;Passive range of motion;Scar mobilization;Dry needling;Vestibular;Joint Manipulations    PT Next Visit Plan  continue working on knee flexion and extension ROM    PT Home Exercise Plan  Medbridge Access Code: [email or web address], also discussed performing heel prop in seated position in kitchen with foot propped on chair (nothing underneath knee) x 3 min during meal times    Consulted and Agree with Plan of Care  Patient       Patient will benefit from skilled therapeutic intervention in order to improve the following deficits and impairments:  Abnormal gait, Decreased balance, Decreased strength, Difficulty walking, Decreased range of motion, Pain  Visit Diagnosis: Chronic pain of left knee  Muscle weakness (generalized)     Problem List Patient Active Problem List   Diagnosis Date Noted  . Left shoulder pain 12/13/2017  . Pre-diabetes 01/06/2017  . Snoring 04/02/2016  . Osteoarthritis of left knee 12/22/2014  . Peanut allergy   . Acute meniscal tear of knee   . Glaucoma   . Acute medial meniscal tear   . Right eye injury 11/11/2012    Claryssa Sandner 10/25/2019, 4:12 PM  [facility]MAIN [facility] [address]RO[address] [place] [address]Phone: [phone]  Fax:  [phone] Name: Alejandro NORFLEETMRN: [id number] of Birth: 710-05-1966

## 2019-10-27 ENCOUNTER — Other Ambulatory Visit: Payer: Self-pay

## 2019-10-27 ENCOUNTER — Ambulatory Visit

## 2019-10-27 DIAGNOSIS — M6281 Muscle weakness (generalized): Secondary | ICD-10-CM

## 2019-10-27 DIAGNOSIS — M25562 Pain in left knee: Secondary | ICD-10-CM | POA: Diagnosis not present

## 2019-10-27 DIAGNOSIS — G8929 Other chronic pain: Secondary | ICD-10-CM

## 2019-10-27 NOTE — Therapy (Signed)
[place] MAIN [facility] SERVICES [address] [place], [place], [address] Phone: [phone]   Fax:  [phone]  Physical Therapy Treatment  Patient Details  Name: Alejandro Beltran MRN: [id number] Date of Birth: 11-06-1965 Referring Provider (PT): Dr. Chesley Mires   Encounter Date: 10/27/2019  PT End of Session - 10/28/19 0928    Visit Number  18    Number of Visits  36    Date for PT Re-Evaluation  12/07/19    Authorization Type  Eval: 09/14/19    PT Start Time  1655    PT Stop Time  1735    PT Time Calculation (min)  40 min    Equipment Utilized During Treatment  Gait belt    Activity Tolerance  Patient tolerated treatment well    Behavior During Therapy  [facility] for tasks assessed/performed       Past Medical History:  Diagnosis Date  . Acute medial meniscal tear    left  . Acute meniscal tear of knee   . Allergy   . Glaucoma   . Peanut allergy     Past Surgical History:  Procedure Laterality Date  . EYE SURGERY     rt retina sx  . MENISCUS REPAIR  08/2015    There were no vitals filed for this visit.  Subjective Assessment - 10/27/19 1702    Subjective  Patient denies any knee pain upon arrival today. No falls or LOB since last therapy session. No specific questions or concerns.    Pertinent History  Pt underwent L TKR 08/15/19. He started OP PT 2 days after surgery at [facility] in [place] however due to the distance of the drive he transferred his care to [facility]. Pt reports that his surgeon would like for him to get therapy 3 days/week and they were only able to schedule him for two days per week so he is transferring his care to [facility]. Pt has been performing his HEP at home which includes static L knee flexion and extension stretches as well as additional LLE strengthening. He continues to utilize his Polar Care as well. Pt had a meniscal repair of L knee in 2014 as well as a meniscal repair to his R knee in 2016.  Prior to surgery he reports pain but fulll L knee range of motion. Other than his surgery pt denies any other recent changes to his health.    Limitations  Walking    Patient Stated Goals  Improve L knee range of motion and strength as well as decrease pain    Currently in Pain?  No/denies   Pt reports mild increase in knee pain after doing "my warm-up" in the hallway         TREATMENT   Ther-ex NuStep L0 for warmup and gentle L knee range of motion,seat position10 progressing to 9x9mnutesduring historywith therapist adjusting the seat position as appropriate, therapist adjusting JAS brace for patient during warm-up; Time spent with patient fitting the JAS brace to allow for more L knee flexion; PrecorL single leg press45# x 20, 55#x 20; Single leg step stretch 45s x 2; 6" forward step ups leading with LLE x 10; 6" L lateral step ups x 10; 6" R lateral cross over step up x 10; 6" L SLS eccentric step down with heel tap and return x 10 with single L UE support; TRX single leg squats LLE x 10;   Manual Therapy L patella inferiorandsuperior mobilizationsgrade III, 30s/bout  x2boutseach; L patella medial and lateral mobilizations grade III, 30s/bout x2boutseach; L knee A/P tibia on femur mobilizations at available end range flexion 30s/bout x 1 bout; L knee extension stretch with active quad set and overpressure by therapist 10s hold x 3; L knee flexion MET stretch 5s hold, 5s relax x 3;   Pt educated throughout session about proper posture and technique with exercises. Improved exercise technique, movement at target joints, use of target muscles after min to mod verbal, visual, tactile cues.   Pt demonstrateshigh motivationduring session today.Continued to progress LLE strengthening and mobility. Adjusted JAS brace for patient to allow for more L knee flexion.  Continued with manual therapy for L knee including MET stretches. Pt demonstrating  improved single leg balance and eccentric quad control with step-downs and TRX single leg squats. He has a closed manipulation scheduled for next Wednesday 11/02/19 and has a PT appointment scheduled for Monday, Tuesday, and Thursday of next week.Pt would benefit from further from skilled PT intervention in order to address mobility, strength, and balance deficits in order to return to full function at home.                       PT Short Term Goals - 10/10/19 0910      PT SHORT TERM GOAL #1   Title  Pt will be independent with HEP in order to decrease knee pain and increase strength in order to improve pain-free function.    Time  6    Period  Weeks    Status  On-going    Target Date  10/26/19        PT Long Term Goals - 10/10/19 0910      PT LONG TERM GOAL #1   Title  Pt will increase LEFS to >60/80 in order to demonstrate significant improvement in lower extremity function.    Baseline  09/14/19: to be completed at next appointment 12/28: 13/80; 10/10/19: 29/80    Time  12    Period  Weeks    Status  Revised    Target Date  12/07/19      PT LONG TERM GOAL #2   Title  Pt will decrease worst pain as reported on NPRS by at least 3 points in order to demonstrate clinically significant reduction in ankle/foot pain.    Baseline  09/14/19: worst: 8/10; 10/10/19: 6/10    Time  12    Period  Weeks    Status  Partially Met    Target Date  12/07/19      PT LONG TERM GOAL #3   Title  Pt will increase strength of L hip abduction and adduction by at least 1/2 MMT grade in order to demonstrate improvement in strength and function    Baseline  09/14/19: abduction: 4/5, adduction: 4-/5; 10/10/19: unchanged    Time  12    Period  Weeks    Status  On-going    Target Date  12/07/19      PT LONG TERM GOAL #4   Title  Pt will improve L knee range of motion to within 5 degrees of neutral extension and greater than 115 degrees of flexion in order to normalize gait and stair  ascend/descend    Baseline  09/13/29: lacking 14 degrees extension, 77 degrees flexion; 10/10/19: Lacking 6 degrees extension to 78 degrees degrees flexion    Time  12    Period  Weeks    Status  Partially  Met    Target Date  12/07/19      PT LONG TERM GOAL #5   Title  Patient will perform the 5x STS without UE support with equal weight acceptance in LE's in <10 seconds to demonstrate functional strength and mobility.    Baseline  12/28: 14 seconds with no uE suppot, RLE significantly used    Time  12    Period  Weeks    Status  New            Plan - 10/28/19 7989    Clinical Impression Statement  Pt demonstrates high motivation during session today. Continued to progress LLE strengthening and mobility. Adjusted JAS brace for patient to allow for more L knee flexion.  Continued with manual therapy for L knee including MET stretches. Pt demonstrating improved single leg balance and eccentric quad control with step-downs and TRX single leg squats. He has a closed manipulation scheduled for next Wednesday 11/02/19 and has a PT appointment scheduled for Monday, Tuesday, and Thursday of next week. Pt would benefit from further from skilled PT intervention in order to address mobility, strength, and balance deficits in order to return to full function at home.    Personal Factors and Comorbidities  Comorbidity 1;Past/Current Experience    Comorbidities  R knee meniscal repair    Examination-Activity Limitations  Carry;Caring for Others;Locomotion Level;Stairs;Squat    Examination-Participation Restrictions  Community Activity;Yard Work    Stability/Clinical Decision Making  Stable/Uncomplicated    Rehab Potential  Good    PT Frequency  3x / week    PT Duration  12 weeks    PT Treatment/Interventions  ADLs/Self Care Home Management;Aquatic Therapy;Biofeedback;Canalith Repostioning;Cryotherapy;Electrical Stimulation;Iontophoresis 40m/ml Dexamethasone;Moist Heat;Traction;Ultrasound;Gait  training;DME Instruction;Stair training;Therapeutic exercise;Therapeutic activities;Balance training;Neuromuscular re-education;Patient/family education;Manual techniques;Passive range of motion;Scar mobilization;Dry needling;Vestibular;Joint Manipulations    PT Next Visit Plan  continue working on knee flexion and extension ROM    PT Home Exercise Plan  Medbridge Access Code: [email or web address], also discussed performing heel prop in seated position in kitchen with foot propped on chair (nothing underneath knee) x 3 min during meal times    Consulted and Agree with Plan of Care  Patient       Patient will benefit from skilled therapeutic intervention in order to improve the following deficits and impairments:  Abnormal gait, Decreased balance, Decreased strength, Difficulty walking, Decreased range of motion, Pain  Visit Diagnosis: Chronic pain of left knee  Muscle weakness (generalized)     Problem List Patient Active Problem List   Diagnosis Date Noted  . Left shoulder pain 12/13/2017  . Pre-diabetes 01/06/2017  . Snoring 04/02/2016  . Osteoarthritis of left knee 12/22/2014  . Peanut allergy   . Acute meniscal tear of knee   . Glaucoma   . Acute medial meniscal tear   . Right eye injury 11/11/2012   JPhillips GroutPT, DPT, GCS  Mariha Sleeper 10/28/2019, 9:39 AM  [place]MAIN [facility]ox[facility] HospitalSERVICES [address] [address]Phone: [phone]  Fax:  [phone] Name: IKAEDAN RICHERTMRN: [id number] of Birth: 703-29-1967

## 2019-10-31 ENCOUNTER — Other Ambulatory Visit: Payer: Self-pay

## 2019-10-31 ENCOUNTER — Ambulatory Visit

## 2019-10-31 DIAGNOSIS — M6281 Muscle weakness (generalized): Secondary | ICD-10-CM

## 2019-10-31 DIAGNOSIS — G8929 Other chronic pain: Secondary | ICD-10-CM

## 2019-10-31 DIAGNOSIS — M25562 Pain in left knee: Secondary | ICD-10-CM | POA: Diagnosis not present

## 2019-10-31 NOTE — Therapy (Signed)
[place] MAIN [facility] SERVICES [address] [place], [place], [address] Phone: [phone]   Fax:  [phone]  Physical Therapy Treatment  Patient Details  Name: Alejandro Beltran MRN: [id number] Date of Birth: 30-Dec-1965 Referring Provider (PT): Dr. Chesley Mires   Encounter Date: 10/31/2019  PT End of Session - 10/31/19 1433    Visit Number  19    Number of Visits  36    Date for PT Re-Evaluation  12/07/19    Authorization Type  Eval: 09/14/19    PT Start Time  1352    PT Stop Time  1430    PT Time Calculation (min)  38 min    Equipment Utilized During Treatment  Gait belt    Activity Tolerance  Patient tolerated treatment well    Behavior During Therapy  [facility] for tasks assessed/performed       Past Medical History:  Diagnosis Date  . Acute medial meniscal tear    left  . Acute meniscal tear of knee   . Allergy   . Glaucoma   . Peanut allergy     Past Surgical History:  Procedure Laterality Date  . EYE SURGERY     rt retina sx  . MENISCUS REPAIR  08/2015    There were no vitals filed for this visit.  Subjective Assessment - 10/31/19 1353    Subjective  Patient denies any knee pain upon arrival today. No falls or LOB since last therapy session. No specific questions or concerns. Has his manipulation scheduled for tomorrow.    Pertinent History  Pt underwent L TKR 08/15/19. He started OP PT 2 days after surgery at [facility] in [place] however due to the distance of the drive he transferred his care to [facility]. Pt reports that his surgeon would like for him to get therapy 3 days/week and they were only able to schedule him for two days per week so he is transferring his care to [facility]. Pt has been performing his HEP at home which includes static L knee flexion and extension stretches as well as additional LLE strengthening. He continues to utilize his Polar Care as well. Pt had a meniscal repair of L knee in 2014 as well  as a meniscal repair to his R knee in 2016. Prior to surgery he reports pain but fulll L knee range of motion. Other than his surgery pt denies any other recent changes to his health.    Limitations  Walking    Patient Stated Goals  Improve L knee range of motion and strength as well as decrease pain    Currently in Pain?  No/denies           TREATMENT   Ther-ex Single leg step stretch 45s x 3; 6" forward step ups leading with LLE x 15; 6" L lateral step ups x 15; 6" R lateral cross over step up x 15; 6" L SLS eccentric step down withheel tap and returnx 15 with single L UE support; Well[employer] exercises: Seated knee extension machine 11# x 20, 22# x 20; Seated HS curl machine 11# x 20, 22# x 20, 33# x 10; Leg press LLE only 53# x 20, 73# x 20; Hooklying L SLR with quad set to initiate x 10;   Manual Therapy L knee A/P tibia on femur mobilizations at available end range flexion 30s/bout x 3 bouts; L knee A/P femur on tibia mobilizations at available end range extension 30s/bout x 2 bouts;  L knee extension stretch with active quad set and overpressure by therapist 10s hold x 3; L knee flexion MET stretch 5s hold, 5s relaxx 3;   Pt educated throughout session about proper posture and technique with exercises. Improved exercise technique, movement at target joints, use of target muscles after min to mod verbal, visual, tactile cues.   Ptdemonstrateshigh motivationduring session today.Continued to progress LLE strengthening and mobility and utilized equipment in the [place] for additional exercises. Continued with manual therapy for L kneeincluding MET stretches. He has a closed manipulation scheduled for tomorrow 11/01/19 and not Wednesday as pt had previously thought. Pt encouraged to follow his surgeon's recommendations after his manipulation and return for therapy as scheduled.Pt would benefit from further from skilled PT intervention in order to address  mobility, strength, and balance deficits in order to return to full function at home.                       PT Short Term Goals - 10/10/19 0910      PT SHORT TERM GOAL #1   Title  Pt will be independent with HEP in order to decrease knee pain and increase strength in order to improve pain-free function.    Time  6    Period  Weeks    Status  On-going    Target Date  10/26/19        PT Long Term Goals - 10/10/19 0910      PT LONG TERM GOAL #1   Title  Pt will increase LEFS to >60/80 in order to demonstrate significant improvement in lower extremity function.    Baseline  09/14/19: to be completed at next appointment 12/28: 13/80; 10/10/19: 29/80    Time  12    Period  Weeks    Status  Revised    Target Date  12/07/19      PT LONG TERM GOAL #2   Title  Pt will decrease worst pain as reported on NPRS by at least 3 points in order to demonstrate clinically significant reduction in ankle/foot pain.    Baseline  09/14/19: worst: 8/10; 10/10/19: 6/10    Time  12    Period  Weeks    Status  Partially Met    Target Date  12/07/19      PT LONG TERM GOAL #3   Title  Pt will increase strength of L hip abduction and adduction by at least 1/2 MMT grade in order to demonstrate improvement in strength and function    Baseline  09/14/19: abduction: 4/5, adduction: 4-/5; 10/10/19: unchanged    Time  12    Period  Weeks    Status  On-going    Target Date  12/07/19      PT LONG TERM GOAL #4   Title  Pt will improve L knee range of motion to within 5 degrees of neutral extension and greater than 115 degrees of flexion in order to normalize gait and stair ascend/descend    Baseline  09/13/29: lacking 14 degrees extension, 77 degrees flexion; 10/10/19: Lacking 6 degrees extension to 78 degrees degrees flexion    Time  12    Period  Weeks    Status  Partially Met    Target Date  12/07/19      PT LONG TERM GOAL #5   Title  Patient will perform the 5x STS without UE support  with equal weight acceptance in LE's in <10 seconds to demonstrate  functional strength and mobility.    Baseline  12/28: 14 seconds with no uE suppot, RLE significantly used    Time  12    Period  Weeks    Status  New            Plan - 10/31/19 1433    Clinical Impression Statement  Pt demonstrates high motivation during session today. Continued to progress LLE strengthening and mobility and utilized equipment in the [place] for additional exercises. Continued with manual therapy for L knee including MET stretches. He has a closed manipulation scheduled for tomorrow 11/01/19 and not Wednesday as pt had previously thought. Pt encouraged to follow his surgeon's recommendations after his manipulation and return for therapy as scheduled. Pt would benefit from further from skilled PT intervention in order to address mobility, strength, and balance deficits in order to return to full function at home.    Personal Factors and Comorbidities  Comorbidity 1;Past/Current Experience    Comorbidities  R knee meniscal repair    Examination-Activity Limitations  Carry;Caring for Others;Locomotion Level;Stairs;Squat    Examination-Participation Restrictions  Community Activity;Yard Work    Stability/Clinical Decision Making  Stable/Uncomplicated    Rehab Potential  Good    PT Frequency  3x / week    PT Duration  12 weeks    PT Treatment/Interventions  ADLs/Self Care Home Management;Aquatic Therapy;Biofeedback;Canalith Repostioning;Cryotherapy;Electrical Stimulation;Iontophoresis 6m/ml Dexamethasone;Moist Heat;Traction;Ultrasound;Gait training;DME Instruction;Stair training;Therapeutic exercise;Therapeutic activities;Balance training;Neuromuscular re-education;Patient/family education;Manual techniques;Passive range of motion;Scar mobilization;Dry needling;Vestibular;Joint Manipulations    PT Next Visit Plan  Progress note; continue working on knee flexion and extension ROM    PT Home Exercise Plan   Medbridge Access Code: [email or web address], also discussed performing heel prop in seated position in kitchen with foot propped on chair (nothing underneath knee) x 3 min during meal times    Consulted and Agree with Plan of Care  Patient       Patient will benefit from skilled therapeutic intervention in order to improve the following deficits and impairments:  Abnormal gait, Decreased balance, Decreased strength, Difficulty walking, Decreased range of motion, Pain  Visit Diagnosis: Chronic pain of left knee  Muscle weakness (generalized)     Problem List Patient Active Problem List   Diagnosis Date Noted  . Left shoulder pain 12/13/2017  . Pre-diabetes 01/06/2017  . Snoring 04/02/2016  . Osteoarthritis of left knee 12/22/2014  . Peanut allergy   . Acute meniscal tear of knee   . Glaucoma   . Acute medial meniscal tear   . Right eye injury 11/11/2012   JPhillips GroutPT, DPT, GCS  Kindell Strada 10/31/2019, 2:36 PM  CWhiteman AFBMAIN RSanta Barbara Psychiatric Health FacilitySERVICES [address] [address]Phone: [phone]  Fax:  [phone] Name: IDENSON NICCOLIMRN: [id number] of Birth: [birth year]

## 2019-11-01 ENCOUNTER — Ambulatory Visit

## 2019-11-03 ENCOUNTER — Ambulatory Visit

## 2019-11-03 ENCOUNTER — Other Ambulatory Visit: Payer: Self-pay

## 2019-11-03 DIAGNOSIS — M25562 Pain in left knee: Secondary | ICD-10-CM | POA: Diagnosis not present

## 2019-11-03 DIAGNOSIS — M6281 Muscle weakness (generalized): Secondary | ICD-10-CM

## 2019-11-03 DIAGNOSIS — G8929 Other chronic pain: Secondary | ICD-10-CM

## 2019-11-03 NOTE — Therapy (Signed)
[place] MAIN [facility] SERVICES [address] [place], [place], [address] Phone: [phone]   Fax:  [phone]  Physical Therapy Progress Note   Dates of reporting period  10/10/19 to 11/03/19  Patient Details  Name: Alejandro Beltran MRN: [id number] Date of Birth: 1966/08/04 Referring Provider (PT): Dr. Chesley Mires   Encounter Date: 11/03/2019  PT End of Session - 11/03/19 1044    Visit Number  20    Number of Visits  36    Date for PT Re-Evaluation  12/07/19    Authorization Type  Eval: 09/14/19    PT Start Time  0852    PT Stop Time  0931    PT Time Calculation (min)  39 min    Equipment Utilized During Treatment  Gait belt    Activity Tolerance  Patient tolerated treatment well    Behavior During Therapy  [facility] for tasks assessed/performed       Past Medical History:  Diagnosis Date  . Acute medial meniscal tear    left  . Acute meniscal tear of knee   . Allergy   . Glaucoma   . Peanut allergy     Past Surgical History:  Procedure Laterality Date  . EYE SURGERY     rt retina sx  . MENISCUS REPAIR  08/2015    There were no vitals filed for this visit.  Subjective Assessment - 11/03/19 0902    Subjective  Patient denies any knee pain upon arrival today. He does states that it feels stiff initially. He had a closed manipulation on Tuesday and per patient report as well as op note surgeon was able to get him to full extension and 110 degrees of flexion. No falls or LOB since last therapy session. No specific questions or concerns.    Pertinent History  Pt underwent L TKR 08/15/19. He started OP PT 2 days after surgery at [facility] in [place] however due to the distance of the drive he transferred his care to [facility]. Pt reports that his surgeon would like for him to get therapy 3 days/week and they were only able to schedule him for two days per week so he is transferring his care to [facility]. Pt has been performing his  HEP at home which includes static L knee flexion and extension stretches as well as additional LLE strengthening. He continues to utilize his Polar Care as well. Pt had a meniscal repair of L knee in 2014 as well as a meniscal repair to his R knee in 2016. Prior to surgery he reports pain but fulll L knee range of motion. Other than his surgery pt denies any other recent changes to his health.    Limitations  Walking    Patient Stated Goals  Improve L knee range of motion and strength as well as decrease pain    Currently in Pain?  No/denies          TREATMENT   Ther-ex xRide x53mnutesduring historywith therapist adjusting the seat position as appropriate to stretch L knee into flexion; Single leg L knee flexion step stretch 45s x 2; Single leg L HS stretch 45s x 2; PrecorL single leg press40# x 20;   Manual Therapy L patella inferiorandsuperiormobilizationsgrade III, 30s/bout x2boutseach; L patellamedial and lateral mobilizationsgrade III, 30s/bout x1boutseach; L knee A/P tibia on femur mobilizations at available end range flexion 30s/bout x 4 bouts; Supine L knee flexion MET stretch 5s hold, 5s relaxx 3; L knee  extension stretch with active quad set and overpressure by therapist 10s hold x 3; L knee femur on tibia A/P mobilizations grade III, 30s/bout x 3 bouts; Seated L knee flexion MET stretch 5s hold, 5s relax x 3, repeated twice; L knee AAROM flexion: -2 to 83 degrees    Pt educated throughout session about proper posture and technique with exercises. Improved exercise technique, movement at target joints, use of target muscles after min to mod verbal, visual, tactile cues.   Ptdemonstrateshigh motivationduring session today.Continued to progressLLE strengtheningand mobility. He demonstrates slight improvement in L knee AAROM since the closed manipulation with flexion at 83 degrees and extension at -2 degrees. Continued with manual therapy  for L kneeincluding MET stretches.Pt encouraged to continue with frequent stretching and use of knee flexion JAS brace at home. Deferred outcome measures to work on manual therapy for L knee range of motion due to recent manipulation. Will update outcome measures and goals at next visit. Pt would benefit from further from skilled PT intervention in order to address mobility, strength, and balance deficits in order to return to full function at home.                    PT Short Term Goals - 11/03/19 1053      PT SHORT TERM GOAL #1   Title  Pt will be independent with HEP in order to decrease knee pain and increase strength in order to improve pain-free function.    Time  6    Period  Weeks    Status  On-going    Target Date  10/26/19        PT Long Term Goals - 11/03/19 1053      PT LONG TERM GOAL #1   Title  Pt will increase LEFS to >60/80 in order to demonstrate significant improvement in lower extremity function.    Baseline  09/14/19: to be completed at next appointment 12/28: 13/80; 10/10/19: 29/80    Time  12    Period  Weeks    Status  Deferred    Target Date  12/07/19      PT LONG TERM GOAL #2   Title  Pt will decrease worst pain as reported on NPRS by at least 3 points in order to demonstrate clinically significant reduction in ankle/foot pain.    Baseline  09/14/19: worst: 8/10; 10/10/19: 6/10    Time  12    Period  Weeks    Status  Deferred    Target Date  12/07/19      PT LONG TERM GOAL #3   Title  Pt will increase strength of L hip abduction and adduction by at least 1/2 MMT grade in order to demonstrate improvement in strength and function    Baseline  09/14/19: abduction: 4/5, adduction: 4-/5; 10/10/19: unchanged; 11/03/19: deferred    Time  12    Period  Weeks    Status  Deferred    Target Date  12/07/19      PT LONG TERM GOAL #4   Title  Pt will improve L knee range of motion to within 5 degrees of neutral extension and greater than 115  degrees of flexion in order to normalize gait and stair ascend/descend    Baseline  09/13/29: lacking 14 degrees extension, 77 degrees flexion; 10/10/19: Lacking 6 degrees extension to 78 degrees flexion; 11/03/19: lacking 2 degrees of extension to 83 degrees flexion    Time  12  Period  Weeks    Status  Partially Met    Target Date  12/07/19      PT LONG TERM GOAL #5   Title  Patient will perform the 5x STS without UE support with equal weight acceptance in LE's in <10 seconds to demonstrate functional strength and mobility.    Baseline  12/28: 14 seconds with no uE suppot, RLE significantly used    Time  12    Period  Weeks    Status  Deferred    Target Date  12/07/19            Plan - 11/03/19 1045    Clinical Impression Statement  Pt demonstrates high motivation during session today. Continued to progress LLE strengthening and mobility. He demonstrates slight improvement in L knee AAROM since the closed manipulation with flexion at 83 degrees and extension at -2 degrees. Continued with manual therapy for L knee including MET stretches. Pt encouraged to continue with frequent stretching and use of knee flexion JAS brace at home. Deferred outcome measures to work on manual therapy for L knee range of motion due to recent manipulation. Will update outcome measures and goals at next visit. Pt would benefit from further from skilled PT intervention in order to address mobility, strength, and balance deficits in order to return to full function at home.    Personal Factors and Comorbidities  Comorbidity 1;Past/Current Experience    Comorbidities  R knee meniscal repair    Examination-Activity Limitations  Carry;Caring for Others;Locomotion Level;Stairs;Squat    Examination-Participation Restrictions  Community Activity;Yard Work    Stability/Clinical Decision Making  Stable/Uncomplicated    Rehab Potential  Good    PT Frequency  3x / week    PT Duration  12 weeks    PT  Treatment/Interventions  ADLs/Self Care Home Management;Aquatic Therapy;Biofeedback;Canalith Repostioning;Cryotherapy;Electrical Stimulation;Iontophoresis 74m/ml Dexamethasone;Moist Heat;Traction;Ultrasound;Gait training;DME Instruction;Stair training;Therapeutic exercise;Therapeutic activities;Balance training;Neuromuscular re-education;Patient/family education;Manual techniques;Passive range of motion;Scar mobilization;Dry needling;Vestibular;Joint Manipulations    PT Next Visit Plan  Update outcome measures/goals; Continue working on knee flexion and extension ROM    PT Home Exercise Plan  Medbridge Access Code: [email or web address], also discussed performing heel prop in seated position in kitchen with foot propped on chair (nothing underneath knee) x 3 min during meal times    Consulted and Agree with Plan of Care  Patient       Patient will benefit from skilled therapeutic intervention in order to improve the following deficits and impairments:  Abnormal gait, Decreased balance, Decreased strength, Difficulty walking, Decreased range of motion, Pain  Visit Diagnosis: Chronic pain of left knee  Muscle weakness (generalized)     Problem List Patient Active Problem List   Diagnosis Date Noted  . Left shoulder pain 12/13/2017  . Pre-diabetes 01/06/2017  . Snoring 04/02/2016  . Osteoarthritis of left knee 12/22/2014  . Peanut allergy   . Acute meniscal tear of knee   . Glaucoma   . Acute medial meniscal tear   . Right eye injury 11/11/2012   Alejandro GroutPT, DPT, GCS  Alejandro Beltran 11/03/2019, 10:57 AM  [place] [facility] [address] [address]Phone: [phone]  Fax:  [phone] Name: Alejandro STGERMAINEMRN: [id number] of Birth: [birth year]

## 2019-11-07 ENCOUNTER — Ambulatory Visit

## 2019-11-07 ENCOUNTER — Other Ambulatory Visit: Payer: Self-pay

## 2019-11-07 DIAGNOSIS — M25562 Pain in left knee: Secondary | ICD-10-CM | POA: Diagnosis not present

## 2019-11-07 DIAGNOSIS — M6281 Muscle weakness (generalized): Secondary | ICD-10-CM

## 2019-11-07 DIAGNOSIS — G8929 Other chronic pain: Secondary | ICD-10-CM

## 2019-11-07 NOTE — Therapy (Signed)
[place] MAIN [facility] SERVICES [address] [place], [place], [address] Phone: [phone]   Fax:  [phone]  Physical Therapy Treatment  Patient Details  Name: Alejandro Beltran MRN: [id number] Date of Birth: March 01, 1966 Referring Provider (PT): Dr. Chesley Mires   Encounter Date: 11/07/2019  PT End of Session - 11/07/19 1020    Visit Number  21    Number of Visits  36    Date for PT Re-Evaluation  12/07/19    Authorization Type  Eval: 09/14/19    PT Start Time  0930    PT Stop Time  1013    PT Time Calculation (min)  43 min    Equipment Utilized During Treatment  Gait belt    Activity Tolerance  Patient tolerated treatment well    Behavior During Therapy  [facility] for tasks assessed/performed       Past Medical History:  Diagnosis Date  . Acute medial meniscal tear    left  . Acute meniscal tear of knee   . Allergy   . Glaucoma   . Peanut allergy     Past Surgical History:  Procedure Laterality Date  . EYE SURGERY     rt retina sx  . MENISCUS REPAIR  08/2015    There were no vitals filed for this visit.  Subjective Assessment - 11/07/19 1017    Subjective  Patient reports no L knee pain prior to session today, only initial stiffness. No falls/LOB since last therapy session. No specific questions or concerns.    Pertinent History  Pt underwent L TKR 08/15/19. He started OP PT 2 days after surgery at [facility] in [place] however due to the distance of the drive he transferred his care to [facility]. Pt reports that his surgeon would like for him to get therapy 3 days/week and they were only able to schedule him for two days per week so he is transferring his care to [facility]. Pt has been performing his HEP at home which includes static L knee flexion and extension stretches as well as additional LLE strengthening. He continues to utilize his Polar Care as well. Pt had a meniscal repair of L knee in 2014 as well as a meniscal  repair to his R knee in 2016. Prior to surgery he reports pain but fulll L knee range of motion. Other than his surgery pt denies any other recent changes to his health.    Limitations  Walking    Patient Stated Goals  Improve L knee range of motion and strength as well as decrease pain    Currently in Pain?  No/denies    Multiple Pain Sites  No        Manual Therapy: for increased L knee flexion/extension L patella mobilizations (inferior, superior), Grade III, 2x 30 sec each mobilization  L patella mobilizations (medial, lateral), Grade III, 30 sec each mobilization  Supine MET stretch L knee flexion, 3x 5 sec hold 5 sec relax  Supine L knee extension with pt ankle on towel roll, active quad set with PT overpressure for 10 sec, 5x   Therapeutic Exercise: Octane bike, 6 min, full rotations alternating direction, for L knee stretch into flexion and LE warm up prior to exercise  Hooklying SLR with quad set at initiation of movement, 10x  [employer]: L single leg press, 53#, 2x15, cueing for knee in neutral alignment  L single leg press with seat positioned for increased L knee flexion, 53#,  10x, pt encouraged to focus on flexion component (concentric portion of intervention)   L leg extension, 2x10, weight #1 on machine  L hamstring curls, 2x10, weight #2 on machine     Pt presented with excellent motivation. He continues to progress with L knee ROM, post-manipulation, and strength. PT noted increased patellar mobility with repeated mobilizations. Pt progressed to full rotations on bike as well. He continues to present with a L knee ROM deficit that limits the range of strengthening he can perform. Pt would benefit from further skilled PT intervention to improve mobility and strength in order to return to full function at home.          PT Education - 11/07/19 1017    Education Details  exercise technique, body mechanics    Person(s) Educated  Patient    Methods   Explanation;Demonstration;Verbal cues;Tactile cues    Comprehension  Verbalized understanding;Returned demonstration;Verbal cues required;Tactile cues required       PT Short Term Goals - 11/03/19 1053      PT SHORT TERM GOAL #1   Title  Pt will be independent with HEP in order to decrease knee pain and increase strength in order to improve pain-free function.    Time  6    Period  Weeks    Status  On-going    Target Date  10/26/19        PT Long Term Goals - 11/03/19 1053      PT LONG TERM GOAL #1   Title  Pt will increase LEFS to >60/80 in order to demonstrate significant improvement in lower extremity function.    Baseline  09/14/19: to be completed at next appointment 12/28: 13/80; 10/10/19: 29/80    Time  12    Period  Weeks    Status  Deferred    Target Date  12/07/19      PT LONG TERM GOAL #2   Title  Pt will decrease worst pain as reported on NPRS by at least 3 points in order to demonstrate clinically significant reduction in ankle/foot pain.    Baseline  09/14/19: worst: 8/10; 10/10/19: 6/10    Time  12    Period  Weeks    Status  Deferred    Target Date  12/07/19      PT LONG TERM GOAL #3   Title  Pt will increase strength of L hip abduction and adduction by at least 1/2 MMT grade in order to demonstrate improvement in strength and function    Baseline  09/14/19: abduction: 4/5, adduction: 4-/5; 10/10/19: unchanged; 11/03/19: deferred    Time  12    Period  Weeks    Status  Deferred    Target Date  12/07/19      PT LONG TERM GOAL #4   Title  Pt will improve L knee range of motion to within 5 degrees of neutral extension and greater than 115 degrees of flexion in order to normalize gait and stair ascend/descend    Baseline  09/13/29: lacking 14 degrees extension, 77 degrees flexion; 10/10/19: Lacking 6 degrees extension to 78 degrees flexion; 11/03/19: lacking 2 degrees of extension to 83 degrees flexion    Time  12    Period  Weeks    Status  Partially Met     Target Date  12/07/19      PT LONG TERM GOAL #5   Title  Patient will perform the 5x STS without UE support with equal weight acceptance  in LE's in <10 seconds to demonstrate functional strength and mobility.    Baseline  12/28: 14 seconds with no uE suppot, RLE significantly used    Time  12    Period  Weeks    Status  Deferred    Target Date  12/07/19            Plan - 11/07/19 1038    Clinical Impression Statement  Pt presented with excellent motivation. He continues to progress with L knee ROM, post-manipulation, and strength. PT noted increased patellar mobility with repeated mobilizations. Pt progressed to full rotations on bike as well. He continues to present with a L knee ROM deficit that limits the range of strengthening he can perform. Pt would benefit from further skilled PT intervention to improve mobility and strength in order to return to full function at home.    Personal Factors and Comorbidities  Comorbidity 1;Past/Current Experience    Comorbidities  R knee meniscal repair    Examination-Activity Limitations  Carry;Caring for Others;Locomotion Level;Stairs;Squat    Examination-Participation Restrictions  Community Activity;Yard Work    Stability/Clinical Decision Making  Stable/Uncomplicated    Rehab Potential  Good    PT Frequency  3x / week    PT Duration  12 weeks    PT Treatment/Interventions  ADLs/Self Care Home Management;Aquatic Therapy;Biofeedback;Canalith Repostioning;Cryotherapy;Electrical Stimulation;Iontophoresis 43m/ml Dexamethasone;Moist Heat;Traction;Ultrasound;Gait training;DME Instruction;Stair training;Therapeutic exercise;Therapeutic activities;Balance training;Neuromuscular re-education;Patient/family education;Manual techniques;Passive range of motion;Scar mobilization;Dry needling;Vestibular;Joint Manipulations    PT Next Visit Plan  Update outcome measures/goals; Continue working on knee flexion and extension ROM    PT Home Exercise Plan   Medbridge Access Code: [email or web address], also discussed performing heel prop in seated position in kitchen with foot propped on chair (nothing underneath knee) x 3 min during meal times    Consulted and Agree with Plan of Care  Patient       Patient will benefit from skilled therapeutic intervention in order to improve the following deficits and impairments:  Abnormal gait, Decreased balance, Decreased strength, Difficulty walking, Decreased range of motion, Pain  Visit Diagnosis: Chronic pain of left knee  Muscle weakness (generalized)     Problem List Patient Active Problem List   Diagnosis Date Noted  . Left shoulder pain 12/13/2017  . Pre-diabetes 01/06/2017  . Snoring 04/02/2016  . Osteoarthritis of left knee 12/22/2014  . Peanut allergy   . Acute meniscal tear of knee   . Glaucoma   . Acute medial meniscal tear   . Right eye injury 11/11/2012    JFlorinda Marker SPT  This entire session was performed under direct supervision and direction of a licensed therapist/therapist assistant . I have personally read, edited and approve of the note as written.  MJanna Arch PT, DPT   11/07/2019, 12:34 PM  [place]MAIN [facility]Ed[facility] HospitalSERVICES [address] [place] [address]Phone: [phone]  Fax:  [phone] Name: IANANIAS KOLANDERMRN: [id number] of Birth: 707/08/1966

## 2019-11-08 ENCOUNTER — Ambulatory Visit

## 2019-11-08 ENCOUNTER — Other Ambulatory Visit: Payer: Self-pay

## 2019-11-08 DIAGNOSIS — M25562 Pain in left knee: Secondary | ICD-10-CM | POA: Diagnosis not present

## 2019-11-08 DIAGNOSIS — M6281 Muscle weakness (generalized): Secondary | ICD-10-CM

## 2019-11-08 DIAGNOSIS — G8929 Other chronic pain: Secondary | ICD-10-CM

## 2019-11-08 NOTE — Therapy (Addendum)
[place] MAIN [facility] SERVICES [address] [place], [place], [address] Phone: [phone]   Fax:  [phone]  Physical Therapy Treatment  Patient Details  Name: Alejandro Beltran MRN: [id number] Date of Birth: 02-Jul-1966 Referring Provider (PT): Dr. Chesley Mires   Encounter Date: 11/08/2019  PT End of Session - 11/08/19 0903    Visit Number  22    Number of Visits  36    Date for PT Re-Evaluation  12/07/19    Authorization Type  Eval: 09/14/19    PT Start Time  0853   pt arrived late   PT Stop Time  0925    PT Time Calculation (min)  32 min    Equipment Utilized During Treatment  Gait belt    Activity Tolerance  Patient tolerated treatment well    Behavior During Therapy  [facility] for tasks assessed/performed       Past Medical History:  Diagnosis Date  . Acute medial meniscal tear    left  . Acute meniscal tear of knee   . Allergy   . Glaucoma   . Peanut allergy     Past Surgical History:  Procedure Laterality Date  . EYE SURGERY     rt retina sx  . MENISCUS REPAIR  08/2015    There were no vitals filed for this visit.  Subjective Assessment - 11/08/19 0857    Subjective  Pt reports increased sharp pain as of last night which persists to this morning. Pt does not think this is related to anything from previous session.    Pertinent History  Pt underwent L TKR 08/15/19. He started OP PT 2 days after surgery at [facility] in [place] however due to the distance of the drive he transferred his care to [facility]. Pt reports that his surgeon would like for him to get therapy 3 days/week and they were only able to schedule him for two days per week so he is transferring his care to [facility]. Pt has been performing his HEP at home which includes static L knee flexion and extension stretches as well as additional LLE strengthening. He continues to utilize his [employer] as well. Pt had a meniscal repair of L knee in 2014 as well as a  meniscal repair to his R knee in 2016. Prior to surgery he reports pain but fulll L knee range of motion. Other than his surgery pt denies any other recent changes to his health.    Currently in Pain?  Yes    Pain Score  7     Pain Location  Knee    Pain Orientation  Left       INTERVENTION THIS DATE -LLE seated heel slides on towel, alternate TKE and flexion stretch 3secH each x 4 minutes -Seated Left hamstrings stretch 3x30sec -Prone quads stretch c belt 5x30sec -Prone left knee flexion 2x10 c 3lb AW -seated LAQ 1x10x3secH  -Supine LLLD TKE stretch c 8lbs atop knee joint 3x60sec  -supine Left SAQ 3x10   *Updated HEP to include LLLD ROM activity     PT Short Term Goals - 11/03/19 1053      PT SHORT TERM GOAL #1   Title  Pt will be independent with HEP in order to decrease knee pain and increase strength in order to improve pain-free function.    Time  6    Period  Weeks    Status  On-going    Target Date  10/26/19  PT Long Term Goals - 11/03/19 1053      PT LONG TERM GOAL #1   Title  Pt will increase LEFS to >60/80 in order to demonstrate significant improvement in lower extremity function.    Baseline  09/14/19: to be completed at next appointment 12/28: 13/80; 10/10/19: 29/80    Time  12    Period  Weeks    Status  Deferred    Target Date  12/07/19      PT LONG TERM GOAL #2   Title  Pt will decrease worst pain as reported on NPRS by at least 3 points in order to demonstrate clinically significant reduction in ankle/foot pain.    Baseline  09/14/19: worst: 8/10; 10/10/19: 6/10    Time  12    Period  Weeks    Status  Deferred    Target Date  12/07/19      PT LONG TERM GOAL #3   Title  Pt will increase strength of L hip abduction and adduction by at least 1/2 MMT grade in order to demonstrate improvement in strength and function    Baseline  09/14/19: abduction: 4/5, adduction: 4-/5; 10/10/19: unchanged; 11/03/19: deferred    Time  12    Period  Weeks     Status  Deferred    Target Date  12/07/19      PT LONG TERM GOAL #4   Title  Pt will improve L knee range of motion to within 5 degrees of neutral extension and greater than 115 degrees of flexion in order to normalize gait and stair ascend/descend    Baseline  09/13/29: lacking 14 degrees extension, 77 degrees flexion; 10/10/19: Lacking 6 degrees extension to 78 degrees flexion; 11/03/19: lacking 2 degrees of extension to 83 degrees flexion    Time  12    Period  Weeks    Status  Partially Met    Target Date  12/07/19      PT LONG TERM GOAL #5   Title  Patient will perform the 5x STS without UE support with equal weight acceptance in LE's in <10 seconds to demonstrate functional strength and mobility.    Baseline  12/28: 14 seconds with no uE suppot, RLE significantly used    Time  12    Period  Weeks    Status  Deferred    Target Date  12/07/19            Plan - 11/08/19 0904    Clinical Impression Statement Pt in mor epain this date, encouraged to maintain efforts as moderate this session to avoid further exacerbation. Progress low load long duration stretching paired with intermittent A/ROM. Pt able to complete entire session as planned with rest breaks provided as needed. Pt maintains high level of focus and motivation despite pain. Knee remains obviously edematous and shiny in complexion. Progress remains steady but slow.     Rehab Potential  Good    PT Frequency  3x / week    PT Duration  12 weeks    PT Treatment/Interventions  ADLs/Self Care Home Management;Aquatic Therapy;Biofeedback;Canalith Repostioning;Cryotherapy;Electrical Stimulation;Iontophoresis 61m/ml Dexamethasone;Moist Heat;Traction;Ultrasound;Gait training;DME Instruction;Stair training;Therapeutic exercise;Therapeutic activities;Balance training;Neuromuscular re-education;Patient/family education;Manual techniques;Passive range of motion;Scar mobilization;Dry needling;Vestibular;Joint Manipulations    PT Next Visit  Plan  Update outcome measures/goals; Continue working on knee flexion and extension ROM    PT Home Exercise Plan  Medbridge Access Code: [email or web address], also discussed performing heel prop in seated position in kitchen with foot propped on chair (nothing  underneath knee) x 3 min during meal times    Consulted and Agree with Plan of Care  Patient       Patient will benefit from skilled therapeutic intervention in order to improve the following deficits and impairments:  Abnormal gait, Decreased balance, Decreased strength, Difficulty walking, Decreased range of motion, Pain  Visit Diagnosis: Chronic pain of left knee  Muscle weakness (generalized)     Problem List Patient Active Problem List   Diagnosis Date Noted  . Left shoulder pain 12/13/2017  . Pre-diabetes 01/06/2017  . Snoring 04/02/2016  . Osteoarthritis of left knee 12/22/2014  . Peanut allergy   . Acute meniscal tear of knee   . Glaucoma   . Acute medial meniscal tear   . Right eye injury 11/11/2012   9:24 AM, 11/08/19 Etta Grandchild, PT, DPT Physical Therapist - [facility]  Outpatient [facility] [place] [phone]     Etta Grandchild 11/08/2019, [place] MAIN [facility] SERVICES [address] [place], [place], [address] Phone: [phone]   Fax:  [phone]  Name: Alejandro Beltran MRN: [id number] Date of Birth: 1966/08/22

## 2019-11-09 ENCOUNTER — Ambulatory Visit

## 2019-11-09 ENCOUNTER — Other Ambulatory Visit: Payer: Self-pay

## 2019-11-09 DIAGNOSIS — M25562 Pain in left knee: Secondary | ICD-10-CM

## 2019-11-09 DIAGNOSIS — G8929 Other chronic pain: Secondary | ICD-10-CM

## 2019-11-09 DIAGNOSIS — M6281 Muscle weakness (generalized): Secondary | ICD-10-CM

## 2019-11-09 NOTE — Therapy (Signed)
[place] MAIN [facility] SERVICES [address] [place], [place], [address] Phone: [phone]   Fax:  [phone]  Physical Therapy Treatment  Patient Details  Name: Alejandro Beltran MRN: [id number] Date of Birth: December 30, 1965 Referring Provider (PT): Dr. Chesley Mires   Encounter Date: 11/09/2019  PT End of Session - 11/09/19 1714    Visit Number  23    Number of Visits  36    Date for PT Re-Evaluation  12/07/19    Authorization Type  Eval: 09/14/19    PT Start Time  1351    PT Stop Time  1431    PT Time Calculation (min)  40 min    Equipment Utilized During Treatment  Gait belt    Activity Tolerance  Patient tolerated treatment well    Behavior During Therapy  [facility] for tasks assessed/performed       Past Medical History:  Diagnosis Date  . Acute medial meniscal tear    left  . Acute meniscal tear of knee   . Allergy   . Glaucoma   . Peanut allergy     Past Surgical History:  Procedure Laterality Date  . EYE SURGERY     rt retina sx  . MENISCUS REPAIR  08/2015    There were no vitals filed for this visit.  Subjective Assessment - 11/09/19 1710    Subjective  Pt reports 6/10 L knee pain due to walking a lot prior to session. Pain was less this morning before the walking. No falls/LOB since last session.    Pertinent History  Pt underwent L TKR 08/15/19. He started OP PT 2 days after surgery at [facility] in [place] however due to the distance of the drive he transferred his care to [facility]. Pt reports that his surgeon would like for him to get therapy 3 days/week and they were only able to schedule him for two days per week so he is transferring his care to [facility]. Pt has been performing his HEP at home which includes static L knee flexion and extension stretches as well as additional LLE strengthening. He continues to utilize his [employer] as well. Pt had a meniscal repair of L knee in 2014 as well as a meniscal repair to  his R knee in 2016. Prior to surgery he reports pain but fulll L knee range of motion. Other than his surgery pt denies any other recent changes to his health.    Currently in Pain?  Yes    Pain Score  6     Pain Location  Knee    Pain Orientation  Left    Pain Descriptors / Indicators  Aching    Pain Type  Chronic pain    Pain Onset  More than a month ago    Pain Frequency  Constant    Multiple Pain Sites  No      Manual Therapy: for increased L knee flexion/extension L patella mobilizations (inferior, superior), Grade III, 2x 30 sec each mobilization  L patella mobilizations (medial, lateral), Grade III, 30 sec each mobilization  Supine L knee extension with pt ankle on towel roll, active quad set with PT overpressure for 10 sec, 3x  Therapeutic Exercise: Octane bike, 5 min, full rotations alternating direction, for L knee stretch into flexion and LE warm up prior to exercise  Single leg airex balance at treadmill, no UE support, 2x 20 sec  STS with 6" step under RLE, quick rainbow ball  pass in standing for cue for power upon standing up, 6" step to encourage LLE use  Squat to plinth without touching, 5x, unequal weight distribution  Progressed to: squat to plinth with RLE on 6" step, 5x  Single leg balance with rainbow ball quick bounces at wall, 20 sec  Progressed to: Airex single leg balance with rainbow ball quick bounces at wall, 2x 20 sec  Cable walks in shallow squat (side steps each direction, front, back), 7.5#, 6 steps out/in each direction   Patient maintains high level of motivation. He performed well this session with stability exercises involving single leg stance and unstable surfaces. He continues to present with decreased L patellar mobility and L knee flexion. Patient would benefit from further skilled PT intervention in order to improve strength, stability, and mobility in order to return to full function at home.           PT Education - 11/09/19  1355    Education Details  exercise technique, body mechanics    Person(s) Educated  Patient    Methods  Demonstration;Explanation;Tactile cues;Verbal cues    Comprehension  Verbalized understanding;Returned demonstration;Verbal cues required;Tactile cues required       PT Short Term Goals - 11/03/19 1053      PT SHORT TERM GOAL #1   Title  Pt will be independent with HEP in order to decrease knee pain and increase strength in order to improve pain-free function.    Time  6    Period  Weeks    Status  On-going    Target Date  10/26/19        PT Long Term Goals - 11/03/19 1053      PT LONG TERM GOAL #1   Title  Pt will increase LEFS to >60/80 in order to demonstrate significant improvement in lower extremity function.    Baseline  09/14/19: to be completed at next appointment 12/28: 13/80; 10/10/19: 29/80    Time  12    Period  Weeks    Status  Deferred    Target Date  12/07/19      PT LONG TERM GOAL #2   Title  Pt will decrease worst pain as reported on NPRS by at least 3 points in order to demonstrate clinically significant reduction in ankle/foot pain.    Baseline  09/14/19: worst: 8/10; 10/10/19: 6/10    Time  12    Period  Weeks    Status  Deferred    Target Date  12/07/19      PT LONG TERM GOAL #3   Title  Pt will increase strength of L hip abduction and adduction by at least 1/2 MMT grade in order to demonstrate improvement in strength and function    Baseline  09/14/19: abduction: 4/5, adduction: 4-/5; 10/10/19: unchanged; 11/03/19: deferred    Time  12    Period  Weeks    Status  Deferred    Target Date  12/07/19      PT LONG TERM GOAL #4   Title  Pt will improve L knee range of motion to within 5 degrees of neutral extension and greater than 115 degrees of flexion in order to normalize gait and stair ascend/descend    Baseline  09/13/29: lacking 14 degrees extension, 77 degrees flexion; 10/10/19: Lacking 6 degrees extension to 78 degrees flexion; 11/03/19: lacking 2  degrees of extension to 83 degrees flexion    Time  12    Period  Weeks  Status  Partially Met    Target Date  12/07/19      PT LONG TERM GOAL #5   Title  Patient will perform the 5x STS without UE support with equal weight acceptance in LE's in <10 seconds to demonstrate functional strength and mobility.    Baseline  12/28: 14 seconds with no uE suppot, RLE significantly used    Time  12    Period  Weeks    Status  Deferred    Target Date  12/07/19            Plan - 11/09/19 1728    Clinical Impression Statement  Patient maintains high level of motivation. He performed well this session with stability exercises involving single leg stance and unstable surfaces. He continues to present with decreased L patellar mobility and L knee flexion. Patient would benefit from further skilled PT intervention in order to improve strength, stability, and mobility in order to return to full function at home.    Rehab Potential  Good    PT Frequency  3x / week    PT Duration  12 weeks    PT Treatment/Interventions  ADLs/Self Care Home Management;Aquatic Therapy;Biofeedback;Canalith Repostioning;Cryotherapy;Electrical Stimulation;Iontophoresis 30m/ml Dexamethasone;Moist Heat;Traction;Ultrasound;Gait training;DME Instruction;Stair training;Therapeutic exercise;Therapeutic activities;Balance training;Neuromuscular re-education;Patient/family education;Manual techniques;Passive range of motion;Scar mobilization;Dry needling;Vestibular;Joint Manipulations    PT Next Visit Plan  Update outcome measures/goals; Continue working on knee flexion and extension ROM    PT Home Exercise Plan  Medbridge Access Code: [email or web address], also discussed performing heel prop in seated position in kitchen with foot propped on chair (nothing underneath knee) x 3 min during meal times    Consulted and Agree with Plan of Care  Patient       Patient will benefit from skilled therapeutic intervention in order to improve the  following deficits and impairments:  Abnormal gait, Decreased balance, Decreased strength, Difficulty walking, Decreased range of motion, Pain  Visit Diagnosis: Chronic pain of left knee  Muscle weakness (generalized)     Problem List Patient Active Problem List   Diagnosis Date Noted  . Left shoulder pain 12/13/2017  . Pre-diabetes 01/06/2017  . Snoring 04/02/2016  . Osteoarthritis of left knee 12/22/2014  . Peanut allergy   . Acute meniscal tear of knee   . Glaucoma   . Acute medial meniscal tear   . Right eye injury 11/11/2012    JFlorinda Marker SPT This entire session was performed under direct supervision and direction of a licensed therapist/therapist assistant . I have personally read, edited and approve of the note as written.  MJanna Arch PT, DPT   11/09/2019, 5:29 PM  [place] [facility] [address] [address]Phone: [phone]  Fax:  [phone] Name: ITHOR NANNINIMRN: [id number] of Birth: [birth year]

## 2019-11-10 ENCOUNTER — Ambulatory Visit

## 2019-11-14 ENCOUNTER — Ambulatory Visit

## 2019-11-14 ENCOUNTER — Other Ambulatory Visit: Payer: Self-pay

## 2019-11-14 DIAGNOSIS — G8929 Other chronic pain: Secondary | ICD-10-CM

## 2019-11-14 DIAGNOSIS — M25562 Pain in left knee: Secondary | ICD-10-CM | POA: Diagnosis not present

## 2019-11-14 DIAGNOSIS — M6281 Muscle weakness (generalized): Secondary | ICD-10-CM

## 2019-11-14 NOTE — Therapy (Signed)
[place] MAIN [facility] SERVICES [address] [place], [place], [address] Phone: [phone]   Fax:  [phone]  Physical Therapy Treatment  Patient Details  Name: Alejandro Beltran MRN: [id number] Date of Birth: [birth year] Referring Provider (PT): Dr. Chesley Mires   Encounter Date: 11/14/2019  PT End of Session - 11/14/19 1614    Visit Number  24    Number of Visits  36    Date for PT Re-Evaluation  12/07/19    Authorization Type  Eval: 09/14/19    PT Start Time  7017    PT Stop Time  1645    PT Time Calculation (min)  38 min    Equipment Utilized During Treatment  Gait belt    Activity Tolerance  Patient tolerated treatment well    Behavior During Therapy  [facility] for tasks assessed/performed       Past Medical History:  Diagnosis Date  . Acute medial meniscal tear    left  . Acute meniscal tear of knee   . Allergy   . Glaucoma   . Peanut allergy     Past Surgical History:  Procedure Laterality Date  . EYE SURGERY     rt retina sx  . MENISCUS REPAIR  08/2015    There were no vitals filed for this visit.  Subjective Assessment - 11/14/19 1613    Subjective  Pt reports 4/10 L knee pain upon arrival today. He continues to remain diligent with his HEP and using his knee flexion brace to stretch. No recent changes in health or medications recently.    Pertinent History  Pt underwent L TKR 08/15/19. He started OP PT 2 days after surgery at [facility] in [place] however due to the distance of the drive he transferred his care to [facility]. Pt reports that his surgeon would like for him to get therapy 3 days/week and they were only able to schedule him for two days per week so he is transferring his care to [facility]. Pt has been performing his HEP at home which includes static L knee flexion and extension stretches as well as additional LLE strengthening. He continues to utilize his Polar Care as well. Pt had a meniscal repair of L  knee in 2014 as well as a meniscal repair to his R knee in 2016. Prior to surgery he reports pain but fulll L knee range of motion. Other than his surgery pt denies any other recent changes to his health.    Currently in Pain?  Yes    Pain Score  4     Pain Location  Knee    Pain Orientation  Left    Pain Descriptors / Indicators  Aching    Pain Type  Surgical pain    Pain Onset  More than a month ago    Pain Frequency  Intermittent          TREATMENT   Ther-ex L Single leg step stretch 45s x 3; 6" forward step ups leading with LLE x 15; 6" L lateral step ups x 15; 6" R lateral cross over step up x 15; 6" L SLS eccentric step down withheel tap and returnx 15 with single L UE support; PrecorL single leg press45# 2 x 20;  Wellzone exercises: Seated knee extension machine 22# 2 x 20; Seated HS curl machine 13# 2 x 20;   Manual Therapy L patella inferior, superior,medial, and lateralmobilizationsgrade III, 30s/bout x2boutseach; L knee A/P tibia on  femur mobilizations at available end range flexion 30s/bout x 3 bouts; Supine L knee flexion MET stretch 5s hold, 5s relaxx 3; L knee extension stretch with active quad set and overpressure by therapist 10s hold x 3; L knee femur on tibia A/P mobilizations at end range extension grade III, 30s/bout x 2 bouts; L knee tibia on femur ER mobilizations at available end range extension 30s/bout x 2 bouts; Seated L knee flexion MET stretch 5s hold, 5s relax x 3;   Pt educated throughout session about proper posture and technique with exercises. Improved exercise technique, movement at target joints, use of target muscles after min to mod verbal, visual, tactile cues.   Ptdemonstrateshigh motivationduring session today.Continued to progressLLE strengtheningand mobility. He continues to demonstrate slight improvement in L knee AAROM since the closed manipulation. Continued with manual therapy for L kneeincluding  MET stretches.Pt encouraged to continue with frequent stretching and use of knee flexion JAS brace at home. Pt would benefit from further from skilled PT intervention in order to address mobility, strength, and balance deficits in order to return to full function at home.                       PT Short Term Goals - 11/03/19 1053      PT SHORT TERM GOAL #1   Title  Pt will be independent with HEP in order to decrease knee pain and increase strength in order to improve pain-free function.    Time  6    Period  Weeks    Status  On-going    Target Date  10/26/19        PT Long Term Goals - 11/03/19 1053      PT LONG TERM GOAL #1   Title  Pt will increase LEFS to >60/80 in order to demonstrate significant improvement in lower extremity function.    Baseline  09/14/19: to be completed at next appointment 12/28: 13/80; 10/10/19: 29/80    Time  12    Period  Weeks    Status  Deferred    Target Date  12/07/19      PT LONG TERM GOAL #2   Title  Pt will decrease worst pain as reported on NPRS by at least 3 points in order to demonstrate clinically significant reduction in ankle/foot pain.    Baseline  09/14/19: worst: 8/10; 10/10/19: 6/10    Time  12    Period  Weeks    Status  Deferred    Target Date  12/07/19      PT LONG TERM GOAL #3   Title  Pt will increase strength of L hip abduction and adduction by at least 1/2 MMT grade in order to demonstrate improvement in strength and function    Baseline  09/14/19: abduction: 4/5, adduction: 4-/5; 10/10/19: unchanged; 11/03/19: deferred    Time  12    Period  Weeks    Status  Deferred    Target Date  12/07/19      PT LONG TERM GOAL #4   Title  Pt will improve L knee range of motion to within 5 degrees of neutral extension and greater than 115 degrees of flexion in order to normalize gait and stair ascend/descend    Baseline  09/13/29: lacking 14 degrees extension, 77 degrees flexion; 10/10/19: Lacking 6 degrees extension  to 78 degrees flexion; 11/03/19: lacking 2 degrees of extension to 83 degrees flexion    Time  12  Period  Weeks    Status  Partially Met    Target Date  12/07/19      PT LONG TERM GOAL #5   Title  Patient will perform the 5x STS without UE support with equal weight acceptance in LE's in <10 seconds to demonstrate functional strength and mobility.    Baseline  12/28: 14 seconds with no uE suppot, RLE significantly used    Time  12    Period  Weeks    Status  Deferred    Target Date  12/07/19            Plan - 11/14/19 1615    Clinical Impression Statement  Pt demonstrates high motivation during session today. Continued to progress LLE strengthening and mobility. He continues to demonstrate slight improvement in L knee AAROM since the closed manipulation. Continued with manual therapy for L knee including MET stretches. Pt encouraged to continue with frequent stretching and use of knee flexion JAS brace at home. Pt would benefit from further from skilled PT intervention in order to address mobility, strength, and balance deficits in order to return to full function at home.    Rehab Potential  Good    PT Frequency  3x / week    PT Duration  12 weeks    PT Treatment/Interventions  ADLs/Self Care Home Management;Aquatic Therapy;Biofeedback;Canalith Repostioning;Cryotherapy;Electrical Stimulation;Iontophoresis 47m/ml Dexamethasone;Moist Heat;Traction;Ultrasound;Gait training;DME Instruction;Stair training;Therapeutic exercise;Therapeutic activities;Balance training;Neuromuscular re-education;Patient/family education;Manual techniques;Passive range of motion;Scar mobilization;Dry needling;Vestibular;Joint Manipulations    PT Next Visit Plan  Continue working on knee flexion and extension ROM    PT Home Exercise Plan  Medbridge Access Code: [email or web address], also discussed performing heel prop in seated position in kitchen with foot propped on chair (nothing underneath knee) x 3 min during meal times     Consulted and Agree with Plan of Care  Patient       Patient will benefit from skilled therapeutic intervention in order to improve the following deficits and impairments:  Abnormal gait, Decreased balance, Decreased strength, Difficulty walking, Decreased range of motion, Pain  Visit Diagnosis: Chronic pain of left knee  Muscle weakness (generalized)     Problem List Patient Active Problem List   Diagnosis Date Noted  . Left shoulder pain 12/13/2017  . Pre-diabetes 01/06/2017  . Snoring 04/02/2016  . Osteoarthritis of left knee 12/22/2014  . Peanut allergy   . Acute meniscal tear of knee   . Glaucoma   . Acute medial meniscal tear   . Right eye injury 11/11/2012   JPhillips GroutPT, DPT, GCS  Derrion Tritz 11/15/2019, 8:08 AM  CCort[place]MAIN [facility]San[facility] CenterSERVICES [address]R[place] [address]Phone: [phone]  Fax:  [phone] Name: Alejandro WERTSMRN: [id number] of Birth: 7Jul 11, 1967

## 2019-11-15 ENCOUNTER — Ambulatory Visit

## 2019-11-15 ENCOUNTER — Other Ambulatory Visit: Payer: Self-pay

## 2019-11-15 DIAGNOSIS — M25562 Pain in left knee: Secondary | ICD-10-CM | POA: Diagnosis not present

## 2019-11-15 DIAGNOSIS — M6281 Muscle weakness (generalized): Secondary | ICD-10-CM

## 2019-11-15 DIAGNOSIS — G8929 Other chronic pain: Secondary | ICD-10-CM

## 2019-11-15 NOTE — Therapy (Signed)
[place] MAIN [facility] SERVICES [address] [place], [place], [address] Phone: [phone]   Fax:  [phone]  Physical Therapy Treatment  Patient Details  Name: Alejandro Beltran MRN: [id number] Date of Birth: [birth year] Referring Provider (PT): Dr. Chesley Mires   Encounter Date: 11/15/2019  PT End of Session - 11/15/19 0854    Visit Number  25    Number of Visits  36    Date for PT Re-Evaluation  12/07/19    Authorization Type  Eval: 09/14/19    PT Start Time  0851    PT Stop Time  0930    PT Time Calculation (min)  39 min    Equipment Utilized During Treatment  Gait belt    Activity Tolerance  Patient tolerated treatment well    Behavior During Therapy  [facility] for tasks assessed/performed       Past Medical History:  Diagnosis Date  . Acute medial meniscal tear    left  . Acute meniscal tear of knee   . Allergy   . Glaucoma   . Peanut allergy     Past Surgical History:  Procedure Laterality Date  . EYE SURGERY     rt retina sx  . MENISCUS REPAIR  08/2015    There were no vitals filed for this visit.  Subjective Assessment - 11/15/19 0853    Subjective  Pt reports 4/10 L knee pain upon arrival today. He continues to remain diligent with his HEP and using his knee flexion brace to stretch. No recent changes in health or medications recently.    Pertinent History  Pt underwent L TKR 08/15/19. He started OP PT 2 days after surgery at [facility] in [place] however due to the distance of the drive he transferred his care to [facility]. Pt reports that his surgeon would like for him to get therapy 3 days/week and they were only able to schedule him for two days per week so he is transferring his care to [facility]. Pt has been performing his HEP at home which includes static L knee flexion and extension stretches as well as additional LLE strengthening. He continues to utilize his Polar Care as well. Pt had a meniscal repair of L  knee in 2014 as well as a meniscal repair to his R knee in 2016. Prior to surgery he reports pain but fulll L knee range of motion. Other than his surgery pt denies any other recent changes to his health.    Currently in Pain?  Yes    Pain Score  4     Pain Location  Knee    Pain Orientation  Left    Pain Descriptors / Indicators  Aching    Pain Type  Surgical pain    Pain Onset  More than a month ago    Pain Frequency  Intermittent         TREATMENT   Ther-ex NuStep L1 progressing seat from position 10 to 7 for increased L knee ROM flexion, warm-up during history x 4 minutes (1 minute unbilled): Matrix hip extension 7.5# 3 x 10;  Wellzone exercises: Seated knee extension machine 11# x 20, 22# x 20; Seated HS curl machine 7# x 20, 13# x 20; Leg press 73# 2 x 20;   Manual Therapy L patella inferior, superior,medial, and lateralmobilizationsgrade III, 20s/bout x2boutseach; L knee A/P tibia on femur mobilizations at available end range flexion 30s/bout x3bouts; L knee extension stretch with active quad  set and overpressure by therapist 10s hold x 3; L knee femur on tibia A/P mobilizations at end range extension grade III, 30s/bout x 2 bouts; L knee tibia on femur ER mobilizations at available end range extension 30s/bout x2bouts; Seated L knee flexion MET stretch 5s hold, 5s relax x 3;   Pt educated throughout session about proper posture and technique with exercises. Improved exercise technique, movement at target joints, use of target muscles after min to mod verbal, visual, tactile cues.   Ptdemonstrateshigh motivationduring session today.Continued to progressLLE strengtheningand mobility. Pt expressed an interest in possibly joining the [employer] center for independent exercise so therapist made a referral for the staff to contact to he patient. Continued with manual therapy for L kneeincluding MET stretches.Pt encouraged to continue with  frequent stretching and use of knee flexion JAS brace at home as well as LLLD extension stretches with weight (backpack).Pt would benefit from further from skilled PT intervention in order to address mobility, strength, and balance deficits in order to return to full function at home.                        PT Short Term Goals - 11/03/19 1053      PT SHORT TERM GOAL #1   Title  Pt will be independent with HEP in order to decrease knee pain and increase strength in order to improve pain-free function.    Time  6    Period  Weeks    Status  On-going    Target Date  10/26/19        PT Long Term Goals - 11/03/19 1053      PT LONG TERM GOAL #1   Title  Pt will increase LEFS to >60/80 in order to demonstrate significant improvement in lower extremity function.    Baseline  09/14/19: to be completed at next appointment 12/28: 13/80; 10/10/19: 29/80    Time  12    Period  Weeks    Status  Deferred    Target Date  12/07/19      PT LONG TERM GOAL #2   Title  Pt will decrease worst pain as reported on NPRS by at least 3 points in order to demonstrate clinically significant reduction in ankle/foot pain.    Baseline  09/14/19: worst: 8/10; 10/10/19: 6/10    Time  12    Period  Weeks    Status  Deferred    Target Date  12/07/19      PT LONG TERM GOAL #3   Title  Pt will increase strength of L hip abduction and adduction by at least 1/2 MMT grade in order to demonstrate improvement in strength and function    Baseline  09/14/19: abduction: 4/5, adduction: 4-/5; 10/10/19: unchanged; 11/03/19: deferred    Time  12    Period  Weeks    Status  Deferred    Target Date  12/07/19      PT LONG TERM GOAL #4   Title  Pt will improve L knee range of motion to within 5 degrees of neutral extension and greater than 115 degrees of flexion in order to normalize gait and stair ascend/descend    Baseline  09/13/29: lacking 14 degrees extension, 77 degrees flexion; 10/10/19: Lacking 6  degrees extension to 78 degrees flexion; 11/03/19: lacking 2 degrees of extension to 83 degrees flexion    Time  12    Period  Weeks    Status  Partially Met    Target Date  12/07/19      PT LONG TERM GOAL #5   Title  Patient will perform the 5x STS without UE support with equal weight acceptance in LE's in <10 seconds to demonstrate functional strength and mobility.    Baseline  12/28: 14 seconds with no uE suppot, RLE significantly used    Time  12    Period  Weeks    Status  Deferred    Target Date  12/07/19            Plan - 11/15/19 0855    Clinical Impression Statement  Pt demonstrates high motivation during session today. Continued to progress LLE strengthening and mobility. Pt expressed an interest in possibly joining the [employer] center for independent exercise so therapist made a referral for the staff to contact to he patient. Continued with manual therapy for L knee including MET stretches. Pt encouraged to continue with frequent stretching and use of knee flexion JAS brace at home as well as LLLD extension stretches with weight (backpack). Pt would benefit from further from skilled PT intervention in order to address mobility, strength, and balance deficits in order to return to full function at home.    Personal Factors and Comorbidities  Comorbidity 1;Past/Current Experience    Examination-Activity Limitations  Carry;Caring for Others;Locomotion Level;Stairs;Squat    Examination-Participation Restrictions  Community Activity;Yard Work    Rehab Potential  Good    PT Frequency  3x / week    PT Duration  12 weeks    PT Treatment/Interventions  ADLs/Self Care Home Management;Aquatic Therapy;Biofeedback;Canalith Repostioning;Cryotherapy;Electrical Stimulation;Iontophoresis 60m/ml Dexamethasone;Moist Heat;Traction;Ultrasound;Gait training;DME Instruction;Stair training;Therapeutic exercise;Therapeutic activities;Balance training;Neuromuscular re-education;Patient/family  education;Manual techniques;Passive range of motion;Scar mobilization;Dry needling;Vestibular;Joint Manipulations    PT Next Visit Plan  Continue working on knee flexion and extension ROM    PT Home Exercise Plan  Medbridge Access Code: [email or web address], also discussed performing heel prop in seated position in kitchen with foot propped on chair (nothing underneath knee) x 3 min during meal times    Consulted and Agree with Plan of Care  Patient       Patient will benefit from skilled therapeutic intervention in order to improve the following deficits and impairments:  Abnormal gait, Decreased balance, Decreased strength, Difficulty walking, Decreased range of motion, Pain  Visit Diagnosis: Chronic pain of left knee  Muscle weakness (generalized)     Problem List Patient Active Problem List   Diagnosis Date Noted  . Left shoulder pain 12/13/2017  . Pre-diabetes 01/06/2017  . Snoring 04/02/2016  . Osteoarthritis of left knee 12/22/2014  . Peanut allergy   . Acute meniscal tear of knee   . Glaucoma   . Acute medial meniscal tear   . Right eye injury 11/11/2012   Alejandro Beltran, Alejandro Beltran, Alejandro Beltran  Alejandro Beltran 11/15/2019, 10:44 AM  CMuldrowMAIN RSutter Valley Medical FoundationSERVICES [address] [place] [address]Phone: [phone]  Fax:  [phone] Name: Alejandro CRESONMRN: [id number] of Birth: [birth year]

## 2019-11-17 ENCOUNTER — Other Ambulatory Visit: Payer: Self-pay

## 2019-11-17 ENCOUNTER — Ambulatory Visit

## 2019-11-17 DIAGNOSIS — G8929 Other chronic pain: Secondary | ICD-10-CM

## 2019-11-17 DIAGNOSIS — M25562 Pain in left knee: Secondary | ICD-10-CM | POA: Diagnosis not present

## 2019-11-17 DIAGNOSIS — M6281 Muscle weakness (generalized): Secondary | ICD-10-CM

## 2019-11-17 NOTE — Therapy (Signed)
[place] MAIN [facility] SERVICES [address] [place], [place], [address] Phone: [phone]   Fax:  [phone]  Physical Therapy Treatment  Patient Details  Name: Alejandro Beltran MRN: [id number] Date of Birth: [birth year] Referring Provider (PT): Dr. Chesley Mires   Encounter Date: 11/17/2019  PT End of Session - 11/18/19 0837    Visit Number  26    Number of Visits  36    Date for PT Re-Evaluation  12/07/19    Authorization Type  Eval: 09/14/19    PT Start Time  1350    PT Stop Time  1430    PT Time Calculation (min)  40 min    Equipment Utilized During Treatment  Gait belt    Activity Tolerance  Patient tolerated treatment well    Behavior During Therapy  [facility] for tasks assessed/performed       Past Medical History:  Diagnosis Date  . Acute medial meniscal tear    left  . Acute meniscal tear of knee   . Allergy   . Glaucoma   . Peanut allergy     Past Surgical History:  Procedure Laterality Date  . EYE SURGERY     rt retina sx  . MENISCUS REPAIR  08/2015    There were no vitals filed for this visit.  Subjective Assessment - 11/18/19 0836    Subjective  Pt reports that he is doing well today. He continues to remain diligent with his HEP and using his knee flexion brace to stretch. He has been using a backpack suspended over his knee ot stretch extension. No recent changes in health or medications. No specific questions or concerns.    Pertinent History  Pt underwent L TKR 08/15/19. He started OP PT 2 days after surgery at [facility] in [place] however due to the distance of the drive he transferred his care to [facility]. Pt reports that his surgeon would like for him to get therapy 3 days/week and they were only able to schedule him for two days per week so he is transferring his care to [facility]. Pt has been performing his HEP at home which includes static L knee flexion and extension stretches as well as additional LLE  strengthening. He continues to utilize his Polar Care as well. Pt had a meniscal repair of L knee in 2014 as well as a meniscal repair to his R knee in 2016. Prior to surgery he reports pain but fulll L knee range of motion. Other than his surgery pt denies any other recent changes to his health.         TREATMENT   Ther-ex Octane L1 rocking for L knee ROM during history x 5 minutes; L knee flexion step stretch 45s hold x 2; 6" forward step ups leading with LLE x 15; 6" L lateral step ups x 15; 6" R lateral cross over step up x 15; 6" L SLS eccentric step down withheel tap and returnx 15with single L UE support;  Wellzone exercises: Seated knee extension machine 11# x 20, 22#x 20; Seated HS curl machine 7# x 20, 13#x 20; Leg press 73# x 20, 93# x 20;   Manual Therapy L patella inferior,superior,medial, and lateralmobilizationsgrade III, 20s/bout x2boutseach; L knee extension stretch with active quad set and overpressure by therapist 10s hold x 3; L knee femur on tibia A/P mobilizationsat end range extensiongrade III, 30s/bout x 2bouts; Supine L knee flexion 90/90 stretch 15s hold x 3;  Pt educated throughout session about proper posture and technique with exercises. Improved exercise technique, movement at target joints, use of target muscles after min to mod verbal, visual, tactile cues.   Ptdemonstrateshigh motivationduring session today.Continued to progressLLE strengtheningand mobility including gym-based strengthening. Continued with manual therapy for L kneeto improve range of motion.Pt encouraged to continue with frequent stretching and use of knee flexion JAS brace at home as well as LLLD extension stretches with weight (backpack).Pt would benefit from further from skilled PT intervention in order to address mobility, strength, and balance deficits in order to return to full function at  home.                        PT Short Term Goals - 11/03/19 1053      PT SHORT TERM GOAL #1   Title  Pt will be independent with HEP in order to decrease knee pain and increase strength in order to improve pain-free function.    Time  6    Period  Weeks    Status  On-going    Target Date  10/26/19        PT Long Term Goals - 11/03/19 1053      PT LONG TERM GOAL #1   Title  Pt will increase LEFS to >60/80 in order to demonstrate significant improvement in lower extremity function.    Baseline  09/14/19: to be completed at next appointment 12/28: 13/80; 10/10/19: 29/80    Time  12    Period  Weeks    Status  Deferred    Target Date  12/07/19      PT LONG TERM GOAL #2   Title  Pt will decrease worst pain as reported on NPRS by at least 3 points in order to demonstrate clinically significant reduction in ankle/foot pain.    Baseline  09/14/19: worst: 8/10; 10/10/19: 6/10    Time  12    Period  Weeks    Status  Deferred    Target Date  12/07/19      PT LONG TERM GOAL #3   Title  Pt will increase strength of L hip abduction and adduction by at least 1/2 MMT grade in order to demonstrate improvement in strength and function    Baseline  09/14/19: abduction: 4/5, adduction: 4-/5; 10/10/19: unchanged; 11/03/19: deferred    Time  12    Period  Weeks    Status  Deferred    Target Date  12/07/19      PT LONG TERM GOAL #4   Title  Pt will improve L knee range of motion to within 5 degrees of neutral extension and greater than 115 degrees of flexion in order to normalize gait and stair ascend/descend    Baseline  09/13/29: lacking 14 degrees extension, 77 degrees flexion; 10/10/19: Lacking 6 degrees extension to 78 degrees flexion; 11/03/19: lacking 2 degrees of extension to 83 degrees flexion    Time  12    Period  Weeks    Status  Partially Met    Target Date  12/07/19      PT LONG TERM GOAL #5   Title  Patient will perform the 5x STS without UE support with  equal weight acceptance in LE's in <10 seconds to demonstrate functional strength and mobility.    Baseline  12/28: 14 seconds with no uE suppot, RLE significantly used    Time  12    Period  Weeks  Status  Deferred    Target Date  12/07/19            Plan - 11/18/19 8891    Clinical Impression Statement  Pt demonstrates high motivation during session today. Continued to progress LLE strengthening and mobility including gym-based strengthening. Continued with manual therapy for L knee to improve range of motion. Pt encouraged to continue with frequent stretching and use of knee flexion JAS brace at home as well as LLLD extension stretches with weight (backpack). Pt would benefit from further from skilled PT intervention in order to address mobility, strength, and balance deficits in order to return to full function at home.    Personal Factors and Comorbidities  Comorbidity 1;Past/Current Experience    Examination-Activity Limitations  Carry;Caring for Others;Locomotion Level;Stairs;Squat    Examination-Participation Restrictions  Community Activity;Yard Work    Rehab Potential  Good    PT Frequency  3x / week    PT Duration  12 weeks    PT Treatment/Interventions  ADLs/Self Care Home Management;Aquatic Therapy;Biofeedback;Canalith Repostioning;Cryotherapy;Electrical Stimulation;Iontophoresis 41m/ml Dexamethasone;Moist Heat;Traction;Ultrasound;Gait training;DME Instruction;Stair training;Therapeutic exercise;Therapeutic activities;Balance training;Neuromuscular re-education;Patient/family education;Manual techniques;Passive range of motion;Scar mobilization;Dry needling;Vestibular;Joint Manipulations    PT Next Visit Plan  Continue working on knee flexion and extension ROM    PT Home Exercise Plan  Medbridge Access Code: [email or web address], also discussed performing heel prop in seated position in kitchen with foot propped on chair (nothing underneath knee) x 3 min during meal times    Consulted and  Agree with Plan of Care  Patient       Patient will benefit from skilled therapeutic intervention in order to improve the following deficits and impairments:  Abnormal gait, Decreased balance, Decreased strength, Difficulty walking, Decreased range of motion, Pain  Visit Diagnosis: Chronic pain of left knee  Muscle weakness (generalized)     Problem List Patient Active Problem List   Diagnosis Date Noted  . Left shoulder pain 12/13/2017  . Pre-diabetes 01/06/2017  . Snoring 04/02/2016  . Osteoarthritis of left knee 12/22/2014  . Peanut allergy   . Acute meniscal tear of knee   . Glaucoma   . Acute medial meniscal tear   . Right eye injury 11/11/2012   JPhillips GroutPT, DPT, GCS  Lanaiya Lantry 11/18/2019, 8:41 AM  [place] [facility] [address] [address]Phone: [phone]  Fax:  [phone] Name: Alejandro ZICKMRN: [id number] of Birth: [birth year]

## 2019-11-21 ENCOUNTER — Other Ambulatory Visit: Payer: Self-pay

## 2019-11-21 ENCOUNTER — Ambulatory Visit: Attending: Nurse Practitioner

## 2019-11-21 DIAGNOSIS — M25562 Pain in left knee: Secondary | ICD-10-CM | POA: Diagnosis not present

## 2019-11-21 DIAGNOSIS — G8929 Other chronic pain: Secondary | ICD-10-CM | POA: Insufficient documentation

## 2019-11-21 DIAGNOSIS — M6281 Muscle weakness (generalized): Secondary | ICD-10-CM | POA: Diagnosis present

## 2019-11-21 NOTE — Therapy (Signed)
[place] MAIN [facility] SERVICES [address] [place], [place], [address] Phone: [phone]   Fax:  [phone]  Physical Therapy Treatment  Patient Details  Name: Alejandro Beltran MRN: [id number] Date of Birth: March 18, 1966 Referring Provider (PT): Dr. Chesley Mires   Encounter Date: 11/21/2019  PT End of Session - 11/21/19 1721    Visit Number  27    Number of Visits  36    Date for PT Re-Evaluation  12/07/19    Authorization Type  Eval: 09/14/19    PT Start Time  1444    PT Stop Time  1515    PT Time Calculation (min)  31 min    Equipment Utilized During Treatment  Gait belt    Activity Tolerance  Patient tolerated treatment well    Behavior During Therapy  [facility] for tasks assessed/performed       Past Medical History:  Diagnosis Date  . Acute medial meniscal tear    left  . Acute meniscal tear of knee   . Allergy   . Glaucoma   . Peanut allergy     Past Surgical History:  Procedure Laterality Date  . EYE SURGERY     rt retina sx  . MENISCUS REPAIR  08/2015    There were no vitals filed for this visit.  Subjective Assessment - 11/21/19 1448    Subjective  Pt reports that he is doing well today. He continues to remain diligent with his HEP and is meeting with Sharee Pimple at the [facility] this afternoon to discuss membership. No recent changes in health or medications. No specific questions or concerns.    Pertinent History  Pt underwent L TKR 08/15/19. He started OP PT 2 days after surgery at [facility] in [place] however due to the distance of the drive he transferred his care to [facility]. Pt reports that his surgeon would like for him to get therapy 3 days/week and they were only able to schedule him for two days per week so he is transferring his care to [facility] Pt has been performing his HEP at home which includes static L knee flexion and extension stretches as well as additional LLE strengthening. He continues to utilize his  Polar Care as well. Pt had a meniscal repair of L knee in 2014 as well as a meniscal repair to his R knee in 2016. Prior to surgery he reports pain but fulll L knee range of motion. Other than his surgery pt denies any other recent changes to his health.    Currently in Pain?  No/denies          TREATMENT   Ther-ex NuStep L0, seat position 9 moving to 8 for gentle L knee range of motion x 3 minutes;  Wellzone exercises: Seated knee extension machine22#2 x 20; Seated HS curl machine13#2 x 20; Leg press 73# 2 x 20;   Manual Therapy L patella inferior,superior,medial, and lateralmobilizationsgrade III,20s/bout x2boutseach; L knee extension stretch with active quad set and overpressure by therapist 10s hold x 1; L knee femur on tibia A/P mobilizationsat end range extensiongrade III, 20s/bout x 2bouts; L knee tibia on femur ER mobilizations at end range extension grade III, 20s/bout 3 bouts; L knee ROM: -8 degrees extension at rest supine, -3 degrees extension with OP; 81 degrees of flexion supine with overpressure;   Pt educated throughout session about proper posture and technique with exercises. Improved exercise technique, movement at target joints, use of target muscles after  min to mod verbal, visual, tactile cues.   Ptdemonstrateshigh motivationduring session today.He arrived late so session was shorter than normal. Continued to progressLLE strengtheningand mobility including gym-based strengthening.Continued with manual therapy for L kneeto improve range of motion.Pt will meet with Sharee Pimple from the [facility] this afternoon to discuss a membership. Will continue to focus on L knee range of motion and strength at upcoming sessions. Pt would benefit from further from skilled PT intervention in order to address mobility, strength, and balance deficits in order to return to full function at home.                       PT Short Term  Goals - 11/03/19 1053      PT SHORT TERM GOAL #1   Title  Pt will be independent with HEP in order to decrease knee pain and increase strength in order to improve pain-free function.    Time  6    Period  Weeks    Status  On-going    Target Date  10/26/19        PT Long Term Goals - 11/03/19 1053      PT LONG TERM GOAL #1   Title  Pt will increase LEFS to >60/80 in order to demonstrate significant improvement in lower extremity function.    Baseline  09/14/19: to be completed at next appointment 12/28: 13/80; 10/10/19: 29/80    Time  12    Period  Weeks    Status  Deferred    Target Date  12/07/19      PT LONG TERM GOAL #2   Title  Pt will decrease worst pain as reported on NPRS by at least 3 points in order to demonstrate clinically significant reduction in ankle/foot pain.    Baseline  09/14/19: worst: 8/10; 10/10/19: 6/10    Time  12    Period  Weeks    Status  Deferred    Target Date  12/07/19      PT LONG TERM GOAL #3   Title  Pt will increase strength of L hip abduction and adduction by at least 1/2 MMT grade in order to demonstrate improvement in strength and function    Baseline  09/14/19: abduction: 4/5, adduction: 4-/5; 10/10/19: unchanged; 11/03/19: deferred    Time  12    Period  Weeks    Status  Deferred    Target Date  12/07/19      PT LONG TERM GOAL #4   Title  Pt will improve L knee range of motion to within 5 degrees of neutral extension and greater than 115 degrees of flexion in order to normalize gait and stair ascend/descend    Baseline  09/13/29: lacking 14 degrees extension, 77 degrees flexion; 10/10/19: Lacking 6 degrees extension to 78 degrees flexion; 11/03/19: lacking 2 degrees of extension to 83 degrees flexion    Time  12    Period  Weeks    Status  Partially Met    Target Date  12/07/19      PT LONG TERM GOAL #5   Title  Patient will perform the 5x STS without UE support with equal weight acceptance in LE's in <10 seconds to demonstrate functional  strength and mobility.    Baseline  12/28: 14 seconds with no uE suppot, RLE significantly used    Time  12    Period  Weeks    Status  Deferred    Target Date  12/07/19            Plan - 11/21/19 1722    Clinical Impression Statement  Pt demonstrates high motivation during session today. He arrived late so session was shorter than normal. Continued to progress LLE strengthening and mobility including gym-based strengthening. Continued with manual therapy for L knee to improve range of motion. Pt will meet with Sharee Pimple from the [facility] this afternoon to discuss a membership. Will continue to focus on L knee range of motion and strength at upcoming sessions. Pt would benefit from further from skilled PT intervention in order to address mobility, strength, and balance deficits in order to return to full function at home.    Personal Factors and Comorbidities  Comorbidity 1;Past/Current Experience    Examination-Activity Limitations  Carry;Caring for Others;Locomotion Level;Stairs;Squat    Examination-Participation Restrictions  Community Activity;Yard Work    Rehab Potential  Good    PT Frequency  3x / week    PT Duration  12 weeks    PT Treatment/Interventions  ADLs/Self Care Home Management;Aquatic Therapy;Biofeedback;Canalith Repostioning;Cryotherapy;Electrical Stimulation;Iontophoresis 55m/ml Dexamethasone;Moist Heat;Traction;Ultrasound;Gait training;DME Instruction;Stair training;Therapeutic exercise;Therapeutic activities;Balance training;Neuromuscular re-education;Patient/family education;Manual techniques;Passive range of motion;Scar mobilization;Dry needling;Vestibular;Joint Manipulations    PT Next Visit Plan  Continue working on knee flexion and extension ROM    PT Home Exercise Plan  Medbridge Access Code: [email or web address], also discussed performing heel prop in seated position in kitchen with foot propped on chair (nothing underneath knee) x 3 min during meal times    Consulted and Agree  with Plan of Care  Patient       Patient will benefit from skilled therapeutic intervention in order to improve the following deficits and impairments:  Abnormal gait, Decreased balance, Decreased strength, Difficulty walking, Decreased range of motion, Pain  Visit Diagnosis: Chronic pain of left knee  Muscle weakness (generalized)     Problem List Patient Active Problem List   Diagnosis Date Noted  . Left shoulder pain 12/13/2017  . Pre-diabetes 01/06/2017  . Snoring 04/02/2016  . Osteoarthritis of left knee 12/22/2014  . Peanut allergy   . Acute meniscal tear of knee   . Glaucoma   . Acute medial meniscal tear   . Right eye injury 11/11/2012   JPhillips GroutPT, DPT, GCS  Ayva Veilleux 11/21/2019, 5:25 PM  CFing[place]MAIN RThe Endoscopy Center [facility] BristolSERVICES [address] [address]Phone: [phone]  Fax:  [phone] Name: IJAHLIL ZILLERMRN: [id number] of Birth: [birth year]

## 2019-11-22 ENCOUNTER — Ambulatory Visit

## 2019-11-23 ENCOUNTER — Other Ambulatory Visit: Payer: Self-pay

## 2019-11-23 ENCOUNTER — Ambulatory Visit

## 2019-11-23 DIAGNOSIS — M25562 Pain in left knee: Secondary | ICD-10-CM

## 2019-11-23 DIAGNOSIS — G8929 Other chronic pain: Secondary | ICD-10-CM

## 2019-11-23 DIAGNOSIS — M6281 Muscle weakness (generalized): Secondary | ICD-10-CM

## 2019-11-23 NOTE — Therapy (Signed)
[place] MAIN [facility] SERVICES [address] [place], [place], [address] Phone: [phone]   Fax:  [phone]  Physical Therapy Treatment  Patient Details  Name: Alejandro Beltran MRN: [id number] Date of Birth: 05-10-1966 Referring Provider (PT): Dr. Chesley Mires   Encounter Date: 11/23/2019  PT End of Session - 11/23/19 1532    Visit Number  28    Number of Visits  36    Date for PT Re-Evaluation  12/07/19    Authorization Type  Eval: 09/14/19    PT Start Time  1520    PT Stop Time  1600    PT Time Calculation (min)  40 min    Equipment Utilized During Treatment  Gait belt    Activity Tolerance  Patient tolerated treatment well    Behavior During Therapy  [facility] for tasks assessed/performed       Past Medical History:  Diagnosis Date  . Acute medial meniscal tear    left  . Acute meniscal tear of knee   . Allergy   . Glaucoma   . Peanut allergy     Past Surgical History:  Procedure Laterality Date  . EYE SURGERY     rt retina sx  . MENISCUS REPAIR  08/2015    There were no vitals filed for this visit.  Subjective Assessment - 11/23/19 1529    Subjective  Pt reports that he is doing well today. He is going to the gym after therapy to exercise. No recent changes in health or medications. No specific questions or concerns.    Pertinent History  Pt underwent L TKR 08/15/19. He started OP PT 2 days after surgery at [facility] in [place] however due to the distance of the drive he transferred his care to [facility]. Pt reports that his surgeon would like for him to get therapy 3 days/week and they were only able to schedule him for two days per week so he is transferring his care to [facility]. Pt has been performing his HEP at home which includes static L knee flexion and extension stretches as well as additional LLE strengthening. He continues to utilize his Polar Care as well. Pt had a meniscal repair of L knee in 2014 as well as a  meniscal repair to his R knee in 2016. Prior to surgery he reports pain but fulll L knee range of motion. Other than his surgery pt denies any other recent changes to his health.    Currently in Pain?  No/denies         TREATMENT   Manual Therapy NuStep L0, seat position 10 moving to 7 for gentle L knee range of motion during history x 6 minutes; L knee step stretch for flexion 60s x 2; L patella inferior,superior,medial, and lateralmobilizationsgrade III,20s/bout x4boutseach; L knee extension stretch with active quad set and overpressure by therapist 10s hold x 3; L knee tibia on femur ER mobilizations at end range extension grade III, 20s/bout x 4 bouts; Hooklying L tibia on femur AP mobilizations at end range flexion, grade III, 20s/bout x 4 bouts; Prone L knee passive extension stretch with 6# ankle weight, towel under thigh, while therapist performs STM and trigger point release to L hamstrings x 5 minutes; Hooklying L knee flexion MET stretch 5s hold x 5; Seated L knee MET stretch 5s hold x 3 with STM to L quad;   Pt educated throughout session about proper posture and technique with exercises. Improved exercise technique,  movement at target joints, use of target muscles after min to mod verbal, visual, tactile cues.   Ptdemonstrateshigh motivationduring session today.Pt is going to the gym to strengthen after the session so focused on manual techniques for range of motion during session. Pt achieves improved L knee extension at end of session following manual interventions. Initiated prone L knee extension hangs with ankle weights and STM to L hamstrings. Will continue to focus on L knee range of motion at upcoming sessions and incorporate strengthening as necessary. Pt is able to perform a lot of his strengthening independently. Pt would benefit from further from skilled PT intervention in order to address mobility, strength, and balance deficits in order to return  to full function at home.                        PT Short Term Goals - 11/03/19 1053      PT SHORT TERM GOAL #1   Title  Pt will be independent with HEP in order to decrease knee pain and increase strength in order to improve pain-free function.    Time  6    Period  Weeks    Status  On-going    Target Date  10/26/19        PT Long Term Goals - 11/03/19 1053      PT LONG TERM GOAL #1   Title  Pt will increase LEFS to >60/80 in order to demonstrate significant improvement in lower extremity function.    Baseline  09/14/19: to be completed at next appointment 12/28: 13/80; 10/10/19: 29/80    Time  12    Period  Weeks    Status  Deferred    Target Date  12/07/19      PT LONG TERM GOAL #2   Title  Pt will decrease worst pain as reported on NPRS by at least 3 points in order to demonstrate clinically significant reduction in ankle/foot pain.    Baseline  09/14/19: worst: 8/10; 10/10/19: 6/10    Time  12    Period  Weeks    Status  Deferred    Target Date  12/07/19      PT LONG TERM GOAL #3   Title  Pt will increase strength of L hip abduction and adduction by at least 1/2 MMT grade in order to demonstrate improvement in strength and function    Baseline  09/14/19: abduction: 4/5, adduction: 4-/5; 10/10/19: unchanged; 11/03/19: deferred    Time  12    Period  Weeks    Status  Deferred    Target Date  12/07/19      PT LONG TERM GOAL #4   Title  Pt will improve L knee range of motion to within 5 degrees of neutral extension and greater than 115 degrees of flexion in order to normalize gait and stair ascend/descend    Baseline  09/13/29: lacking 14 degrees extension, 77 degrees flexion; 10/10/19: Lacking 6 degrees extension to 78 degrees flexion; 11/03/19: lacking 2 degrees of extension to 83 degrees flexion    Time  12    Period  Weeks    Status  Partially Met    Target Date  12/07/19      PT LONG TERM GOAL #5   Title  Patient will perform the 5x STS without  UE support with equal weight acceptance in LE's in <10 seconds to demonstrate functional strength and mobility.    Baseline  12/28:  14 seconds with no uE suppot, RLE significantly used    Time  12    Period  Weeks    Status  Deferred    Target Date  12/07/19            Plan - 11/23/19 1532    Clinical Impression Statement  Pt demonstrates high motivation during session today. Pt is going to the gym to strengthen after the session so focused on manual techniques for range of motion during session. Pt achieves improved L knee extension at end of session following manual interventions. Initiated prone L knee extension hangs with ankle weights and STM to L hamstrings. Will continue to focus on L knee range of motion at upcoming sessions and incorporate strengthening as necessary. Pt is able to perform a lot of his strengthening independently. Pt would benefit from further from skilled PT intervention in order to address mobility, strength, and balance deficits in order to return to full function at home.    Personal Factors and Comorbidities  Comorbidity 1;Past/Current Experience    Examination-Activity Limitations  Carry;Caring for Others;Locomotion Level;Stairs;Squat    Examination-Participation Restrictions  Community Activity;Yard Work    Rehab Potential  Good    PT Frequency  3x / week    PT Duration  12 weeks    PT Treatment/Interventions  ADLs/Self Care Home Management;Aquatic Therapy;Biofeedback;Canalith Repostioning;Cryotherapy;Electrical Stimulation;Iontophoresis 21m/ml Dexamethasone;Moist Heat;Traction;Ultrasound;Gait training;DME Instruction;Stair training;Therapeutic exercise;Therapeutic activities;Balance training;Neuromuscular re-education;Patient/family education;Manual techniques;Passive range of motion;Scar mobilization;Dry needling;Vestibular;Joint Manipulations    PT Next Visit Plan  Continue working on knee flexion and extension ROM    PT Home Exercise Plan  Medbridge Access  Code: [email or web address], also discussed performing heel prop in seated position in kitchen with foot propped on chair (nothing underneath knee) x 3 min during meal times    Consulted and Agree with Plan of Care  Patient       Patient will benefit from skilled therapeutic intervention in order to improve the following deficits and impairments:  Abnormal gait, Decreased balance, Decreased strength, Difficulty walking, Decreased range of motion, Pain  Visit Diagnosis: Muscle weakness (generalized)  Chronic pain of left knee     Problem List Patient Active Problem List   Diagnosis Date Noted  . Left shoulder pain 12/13/2017  . Pre-diabetes 01/06/2017  . Snoring 04/02/2016  . Osteoarthritis of left knee 12/22/2014  . Peanut allergy   . Acute meniscal tear of knee   . Glaucoma   . Acute medial meniscal tear   . Right eye injury 11/11/2012   JPhillips GroutPT, DPT, GCS  Kaleeah Gingerich 11/24/2019, 12:44 PM  [place] [facility] [address] [place] [address]Phone: [phone]  Fax:  [phone] Name: Alejandro CHAITMRN: [id number] of Birth: [birth year]

## 2019-11-24 ENCOUNTER — Other Ambulatory Visit: Payer: Self-pay

## 2019-11-24 ENCOUNTER — Ambulatory Visit

## 2019-11-24 DIAGNOSIS — M25562 Pain in left knee: Secondary | ICD-10-CM

## 2019-11-24 DIAGNOSIS — M6281 Muscle weakness (generalized): Secondary | ICD-10-CM

## 2019-11-24 DIAGNOSIS — G8929 Other chronic pain: Secondary | ICD-10-CM

## 2019-11-24 NOTE — Therapy (Signed)
[place] MAIN [facility] SERVICES [address] [place], [place], [address] Phone: [phone]   Fax:  [phone]  Physical Therapy Treatment  Patient Details  Name: Alejandro Beltran MRN: [id number] Date of Birth: 1966/07/31 Referring Provider (PT): Dr. Chesley Mires   Encounter Date: 11/24/2019  PT End of Session - 11/24/19 1408    Visit Number  29    Number of Visits  36    Date for PT Re-Evaluation  12/07/19    Authorization Type  Eval: 09/14/19    PT Start Time  1345    PT Stop Time  1430    PT Time Calculation (min)  45 min    Equipment Utilized During Treatment  Gait belt    Activity Tolerance  Patient tolerated treatment well    Behavior During Therapy  [facility] for tasks assessed/performed       Past Medical History:  Diagnosis Date  . Acute medial meniscal tear    left  . Acute meniscal tear of knee   . Allergy   . Glaucoma   . Peanut allergy     Past Surgical History:  Procedure Laterality Date  . EYE SURGERY     rt retina sx  . MENISCUS REPAIR  08/2015    There were no vitals filed for this visit.  Subjective Assessment - 11/24/19 1408    Subjective  Pt reports that he is doing well today. He exercised at the gym prior to therapy session. No recent changes in health or medications. No specific questions or concerns. He has a follow-up with his orthopedic surgeon tomorrow.    Pertinent History  Pt underwent L TKR 08/15/19. He started OP PT 2 days after surgery at [facility] in [place] however due to the distance of the drive he transferred his care to [facility]. Pt reports that his surgeon would like for him to get therapy 3 days/week and they were only able to schedule him for two days per week so he is transferring his care to [facility]. Pt has been performing his HEP at home which includes static L knee flexion and extension stretches as well as additional LLE strengthening. He continues to utilize his Polar Care as well.  Pt had a meniscal repair of L knee in 2014 as well as a meniscal repair to his R knee in 2016. Prior to surgery he reports pain but fulll L knee range of motion. Other than his surgery pt denies any other recent changes to his health.    Currently in Pain?  Yes    Pain Score  4     Pain Location  Knee    Pain Orientation  Left    Pain Descriptors / Indicators  Aching    Pain Type  Surgical pain    Pain Onset  More than a month ago           TREATMENT   Manual Therapy L patella inferior,superior,medial, and lateralmobilizationsgrade III,20s/bout x4boutseach; L knee extension stretch with active quad set and overpressure by therapist 10s hold x3; L knee femur on tibia AP mobilizations at end range extension grade III, 20s/bout x 3 bouts; L knee tibia on femur ER mobilizations at end range extension grade III, 20s/bout x 4 bouts; Hooklying L tibia on femur AP mobilizations at end range flexion, grade III, 20s/bout x 4 bouts; Prone L knee passive extension stretch with 5# ankle weight, towel under thigh, while therapist performs STM and trigger point  release to L hamstrings x 5 minutes; Hooklying L knee flexion MET stretch 5s hold x 5;  -10 degrees extension at rest supine, -3 degrees extension with OP; 83 degrees of flexion supine with overpressure;   Pt educated throughout session about proper posture and technique with exercises. Improved exercise technique, movement at target joints, use of target muscles after min to mod verbal, visual, tactile cues.   Ptdemonstrateshigh motivationduring session today. He has started back at the gym this week and focusing on strengthening. In turn therapy sessions have focused almost exclusively this week on range of motion. He continues to make gains in strength but his L knee range of motion remains significantly limited particularly in flexion. His resting L knee extension is around -10 degrees but he is able to achieve -3  degrees with overpressure by therapist. His L knee flexion with aggressive overpressure is around 83 degrees which is 5 degrees more than before his manipulation. This is most definitely his most significant limitation. Pt has been aggressive working on range of motion at home and therapist has also utilized multiple stretching and soft tissue techniques to work on his range of motion. He would benefit from further from skilled PT intervention in order to address mobility, strength, and range of motion deficits in order to return to full function at home.                        PT Short Term Goals - 11/03/19 1053      PT SHORT TERM GOAL #1   Title  Pt will be independent with HEP in order to decrease knee pain and increase strength in order to improve pain-free function.    Time  6    Period  Weeks    Status  On-going    Target Date  10/26/19        PT Long Term Goals - 11/03/19 1053      PT LONG TERM GOAL #1   Title  Pt will increase LEFS to >60/80 in order to demonstrate significant improvement in lower extremity function.    Baseline  09/14/19: to be completed at next appointment 12/28: 13/80; 10/10/19: 29/80    Time  12    Period  Weeks    Status  Deferred    Target Date  12/07/19      PT LONG TERM GOAL #2   Title  Pt will decrease worst pain as reported on NPRS by at least 3 points in order to demonstrate clinically significant reduction in ankle/foot pain.    Baseline  09/14/19: worst: 8/10; 10/10/19: 6/10    Time  12    Period  Weeks    Status  Deferred    Target Date  12/07/19      PT LONG TERM GOAL #3   Title  Pt will increase strength of L hip abduction and adduction by at least 1/2 MMT grade in order to demonstrate improvement in strength and function    Baseline  09/14/19: abduction: 4/5, adduction: 4-/5; 10/10/19: unchanged; 11/03/19: deferred    Time  12    Period  Weeks    Status  Deferred    Target Date  12/07/19      PT LONG TERM GOAL #4    Title  Pt will improve L knee range of motion to within 5 degrees of neutral extension and greater than 115 degrees of flexion in order to normalize gait and stair ascend/descend  Baseline  09/13/29: lacking 14 degrees extension, 77 degrees flexion; 10/10/19: Lacking 6 degrees extension to 78 degrees flexion; 11/03/19: lacking 2 degrees of extension to 83 degrees flexion    Time  12    Period  Weeks    Status  Partially Met    Target Date  12/07/19      PT LONG TERM GOAL #5   Title  Patient will perform the 5x STS without UE support with equal weight acceptance in LE's in <10 seconds to demonstrate functional strength and mobility.    Baseline  12/28: 14 seconds with no uE suppot, RLE significantly used    Time  12    Period  Weeks    Status  Deferred    Target Date  12/07/19            Plan - 11/24/19 1409    Clinical Impression Statement  Pt demonstrates high motivation during session today. He has started back at the gym this week and focusing on strengthening. In turn therapy sessions have focused almost exclusively this week on range of motion. He continues to make gains in strength but his L knee range of motion remains significantly limited particularly in flexion. His resting L knee extension is around -10 degrees but he is able to achieve -3 degrees with overpressure by therapist. His L knee flexion with aggressive overpressure is around 83 degrees which is 5 degrees more than before his manipulation. This is most definitely his most significant limitation. Pt has been aggressive working on range of motion at home and therapist has also utilized multiple stretching and soft tissue techniques to work on his range of motion. He would benefit from further from skilled PT intervention in order to address mobility, strength, and range of motion deficits in order to return to full function at home.    Personal Factors and Comorbidities  Comorbidity 1;Past/Current Experience     Examination-Activity Limitations  Carry;Caring for Others;Locomotion Level;Stairs;Squat    Examination-Participation Restrictions  Community Activity;Yard Work    Clinical Decision Making  Moderate    Rehab Potential  Good    PT Frequency  3x / week    PT Duration  12 weeks    PT Treatment/Interventions  ADLs/Self Care Home Management;Aquatic Therapy;Biofeedback;Canalith Repostioning;Cryotherapy;Electrical Stimulation;Iontophoresis 85m/ml Dexamethasone;Moist Heat;Traction;Ultrasound;Gait training;DME Instruction;Stair training;Therapeutic exercise;Therapeutic activities;Balance training;Neuromuscular re-education;Patient/family education;Manual techniques;Passive range of motion;Scar mobilization;Dry needling;Vestibular;Joint Manipulations    PT Next Visit Plan  Progress note; Continue working on knee flexion and extension ROM    PT Home Exercise Plan  Medbridge Access Code: [email or web address], also discussed performing heel prop in seated position in kitchen with foot propped on chair (nothing underneath knee) x 3 min during meal times    Consulted and Agree with Plan of Care  Patient       Patient will benefit from skilled therapeutic intervention in order to improve the following deficits and impairments:  Abnormal gait, Decreased balance, Decreased strength, Difficulty walking, Decreased range of motion, Pain  Visit Diagnosis: Muscle weakness (generalized)  Chronic pain of left knee     Problem List Patient Active Problem List   Diagnosis Date Noted  . Left shoulder pain 12/13/2017  . Pre-diabetes 01/06/2017  . Snoring 04/02/2016  . Osteoarthritis of left knee 12/22/2014  . Peanut allergy   . Acute meniscal tear of knee   . Glaucoma   . Acute medial meniscal tear   . Right eye injury 11/11/2012   JLyndel SafeHuprich PT, DPT, GCS  Erleen Egner  11/24/2019, 3:28 PM  [place] MAIN [facility] SERVICES [address] [place], [place], [address] Phone:  [phone]   Fax:  [phone]  Name: KOLBEY TEICHERT MRN: [id number] Date of Birth: 1966/04/15

## 2019-11-28 ENCOUNTER — Ambulatory Visit

## 2019-11-28 ENCOUNTER — Other Ambulatory Visit: Payer: Self-pay

## 2019-11-28 DIAGNOSIS — M6281 Muscle weakness (generalized): Secondary | ICD-10-CM

## 2019-11-28 DIAGNOSIS — M25562 Pain in left knee: Secondary | ICD-10-CM | POA: Diagnosis not present

## 2019-11-28 DIAGNOSIS — G8929 Other chronic pain: Secondary | ICD-10-CM

## 2019-11-28 NOTE — Therapy (Signed)
[place] MAIN [facility] SERVICES [address] [place], [place], [address] Phone: [phone]   Fax:  [phone]  Physical Therapy Progress Note   Dates of reporting period  11/03/19   to   11/28/19  Patient Details  Name: Alejandro Beltran MRN: [id number] Date of Birth: 25-Jan-1966 Referring Provider (PT): Dr. Chesley Mires   Encounter Date: 11/28/2019  PT End of Session - 11/28/19 1445    Visit Number  30    Number of Visits  36    Date for PT Re-Evaluation  12/07/19    Authorization Type  Eval: 09/14/19    PT Start Time  1440    PT Stop Time  1535    PT Time Calculation (min)  55 min    Equipment Utilized During Treatment  Gait belt    Activity Tolerance  Patient tolerated treatment well    Behavior During Therapy  [facility] for tasks assessed/performed       Past Medical History:  Diagnosis Date  . Acute medial meniscal tear    left  . Acute meniscal tear of knee   . Allergy   . Glaucoma   . Peanut allergy     Past Surgical History:  Procedure Laterality Date  . EYE SURGERY     rt retina sx  . MENISCUS REPAIR  08/2015    There were no vitals filed for this visit.  Subjective Assessment - 11/28/19 1444    Subjective  Pt reports that he is doing well today. Denies any pain upon arrival but he was having some pain this morning. He is headed over to exercise in the gym after therapy session. He saw his orthopedic surgeon on Friday who reported that there is no further intervention he would recommend to improve the range of motion.    Pertinent History  Pt underwent L TKR 08/15/19. He started OP PT 2 days after surgery at [facility] in [place] however due to the distance of the drive he transferred his care to [facility]. Pt reports that his surgeon would like for him to get therapy 3 days/week and they were only able to schedule him for two days per week so he is transferring his care to [facility]. Pt has been performing his HEP at home  which includes static L knee flexion and extension stretches as well as additional LLE strengthening. He continues to utilize his Polar Care as well. Pt had a meniscal repair of L knee in 2014 as well as a meniscal repair to his R knee in 2016. Prior to surgery he reports pain but fulll L knee range of motion. Other than his surgery pt denies any other recent changes to his health.    Currently in Pain?  No/denies         TREATMENT   Manual Therapy NuStep L0, seat position 10 moving to 7 for gentle L knee range of motion during history x 5 minutes; Step stretch 45s hold x 2; Prone L knee passive extension stretch with 5# ankle weight, towel under thigh, while therapist performs STM and trigger point release to L hamstrings x 5 minutes; L patella inferior,superior, medial, and lateralmobilizationsgrade III,20s/bout x4boutseach; L knee extension stretch with active quad set and overpressure by therapist 10s hold x3; L knee femur on tibia AP mobilizations at end range extension grade III, 20s/bout x 3 bouts; L knee tibia on femur ER mobilizations at end range extension grade III, 20s/boutx 4bouts; Hooklying L tibia  on femur AP mobilizations at end range flexion, grade III, 20s/bout x 4 bouts; Hooklying L knee flexion stretch 10s hold x 5; Seated L knee MET flexion stretch 5s hold x 3, performed twice; Seated L knee tibia on femur AP mobilizations at end range flexion;  Updated outcome measures/goals with patient: LEFS: 30/80 MMT LLE: abduction: 4/5, adduction: 4/5;  L knee AAROM with overpressure: -10 degrees extension at rest supine, -2 degrees extension with OP; 86 degrees of flexion supine with overpressure;   Pt educated throughout session about proper posture and technique with exercises. Improved exercise technique, movement at target joints, use of target muscles after min to mod verbal, visual, tactile cues.   Ptdemonstrateshigh motivationduring session  today. He is now exercising at the gym so therapy sessions have focused primarily on range of motion. He continues to make gains in strength but his L knee range of motion remains significantly limited particularly in flexion. His resting L knee extension is around -10 degrees but he is able to achieve -2 degrees with overpressure by therapist. His L knee flexion with aggressive overpressure is around 86 degrees which is 5 degrees more than before his manipulation and about 3 degrees more than his last therapy session. L knee flexion is definitely his most significant limitation. Pt has been aggressively working on range of motion at home and therapist has also utilized multiple stretching and soft tissue techniques to work on his range of motion. He would benefit from further from skilled PT intervention in order to address mobility, strength, and range of motion deficits in order to return to full function at home.                        PT Short Term Goals - 11/28/19 1502      PT SHORT TERM GOAL #1   Title  Pt will be independent with HEP in order to decrease knee pain and increase strength in order to improve pain-free function.    Time  6    Period  Weeks    Status  Achieved        PT Long Term Goals - 11/28/19 1502      PT LONG TERM GOAL #1   Title  Pt will increase LEFS to >60/80 in order to demonstrate significant improvement in lower extremity function.    Baseline  09/14/19: to be completed at next appointment 12/28: 13/80; 10/10/19: 29/80; 11/28/19: 30/80    Time  12    Period  Weeks    Status  Partially Met    Target Date  12/07/19      PT LONG TERM GOAL #2   Title  Pt will decrease worst pain as reported on NPRS by at least 3 points in order to demonstrate clinically significant reduction in ankle/foot pain.    Baseline  09/14/19: worst: 8/10; 10/10/19: 6/10; 11/28/19: 8/10;    Time  12    Period  Weeks    Status  On-going    Target Date  12/07/19      PT  LONG TERM GOAL #3   Title  Pt will increase strength of L hip abduction and adduction by at least 1/2 MMT grade in order to demonstrate improvement in strength and function    Baseline  09/14/19: abduction: 4/5, adduction: 4-/5; 10/10/19: unchanged; 11/03/19: deferred; 11/28/19: abduction: 4/5, adduction: 4/5;    Time  12    Period  Weeks    Status  Partially Met    Target Date  12/07/19      PT LONG TERM GOAL #4   Title  Pt will improve L knee range of motion to within 5 degrees of neutral extension and greater than 115 degrees of flexion in order to normalize gait and stair ascend/descend    Baseline  09/13/29: lacking 14 degrees extension, 77 degrees flexion; 10/10/19: Lacking 6 degrees extension to 78 degrees flexion; 11/03/19: lacking 2 degrees of extension to 83 degrees flexion; 11/28/19: lacking 2 degrees of extension to 83 degrees flexion    Time  12    Period  Weeks    Status  Partially Met    Target Date  12/07/19      PT LONG TERM GOAL #5   Title  Patient will perform the 5x STS without UE support with equal weight acceptance in LE's in <10 seconds to demonstrate functional strength and mobility.    Baseline  12/28: 14 seconds with no uE suppot, RLE significantly used    Time  12    Period  Weeks    Status  Deferred    Target Date  12/07/19            Plan - 11/28/19 1446    Clinical Impression Statement  Pt demonstrates high motivation during session today. He is now exercising at the gym so therapy sessions have focused primarily on range of motion. He continues to make gains in strength but his L knee range of motion remains significantly limited particularly in flexion. His resting L knee extension is around -10 degrees but he is able to achieve -2 degrees with overpressure by therapist. His L knee flexion with aggressive overpressure is around 86 degrees which is 5 degrees more than before his manipulation and about 3 degrees more than his last therapy session. L knee flexion  is definitely his most significant limitation. Pt has been aggressively working on range of motion at home and therapist has also utilized multiple stretching and soft tissue techniques to work on his range of motion. He would benefit from further from skilled PT intervention in order to address mobility, strength, and range of motion deficits in order to return to full function at home.    Personal Factors and Comorbidities  Comorbidity 1;Past/Current Experience    Examination-Activity Limitations  Carry;Caring for Others;Locomotion Level;Stairs;Squat    Examination-Participation Restrictions  Community Activity;Yard Work    Rehab Potential  Good    PT Frequency  3x / week    PT Duration  12 weeks    PT Treatment/Interventions  ADLs/Self Care Home Management;Aquatic Therapy;Biofeedback;Canalith Repostioning;Cryotherapy;Electrical Stimulation;Iontophoresis 72m/ml Dexamethasone;Moist Heat;Traction;Ultrasound;Gait training;DME Instruction;Stair training;Therapeutic exercise;Therapeutic activities;Balance training;Neuromuscular re-education;Patient/family education;Manual techniques;Passive range of motion;Scar mobilization;Dry needling;Vestibular;Joint Manipulations    PT Next Visit Plan  Progress note; Continue working on knee flexion and extension ROM    PT Home Exercise Plan  Medbridge Access Code: [email or web address], also discussed performing heel prop in seated position in kitchen with foot propped on chair (nothing underneath knee) x 3 min during meal times    Consulted and Agree with Plan of Care  Patient       Patient will benefit from skilled therapeutic intervention in order to improve the following deficits and impairments:  Abnormal gait, Decreased balance, Decreased strength, Difficulty walking, Decreased range of motion, Pain  Visit Diagnosis: Muscle weakness (generalized)  Chronic pain of left knee     Problem List Patient Active Problem List   Diagnosis Date Noted  .  Left shoulder pain  12/13/2017  . Pre-diabetes 01/06/2017  . Snoring 04/02/2016  . Osteoarthritis of left knee 12/22/2014  . Peanut allergy   . Acute meniscal tear of knee   . Glaucoma   . Acute medial meniscal tear   . Right eye injury 11/11/2012   Phillips Grout PT, DPT, GCS  Taiwan Talcott 11/29/2019, 10:33 AM  [place] MAIN [facility] SERVICES [address] [place], [place], [address] Phone: [phone]   Fax:  [phone]  Name: Alejandro Beltran MRN: [id number] Date of Birth: Jan 24, 1966

## 2019-11-30 ENCOUNTER — Other Ambulatory Visit: Payer: Self-pay

## 2019-11-30 ENCOUNTER — Ambulatory Visit

## 2019-11-30 DIAGNOSIS — G8929 Other chronic pain: Secondary | ICD-10-CM

## 2019-11-30 DIAGNOSIS — M6281 Muscle weakness (generalized): Secondary | ICD-10-CM

## 2019-11-30 DIAGNOSIS — M25562 Pain in left knee: Secondary | ICD-10-CM | POA: Diagnosis not present

## 2019-12-01 ENCOUNTER — Ambulatory Visit

## 2019-12-01 DIAGNOSIS — M6281 Muscle weakness (generalized): Secondary | ICD-10-CM

## 2019-12-01 DIAGNOSIS — M25562 Pain in left knee: Secondary | ICD-10-CM

## 2019-12-01 DIAGNOSIS — G8929 Other chronic pain: Secondary | ICD-10-CM

## 2019-12-01 NOTE — Therapy (Signed)
[place] MAIN [facility] SERVICES [address] [place], [place], [address] Phone: [phone]   Fax:  [phone]  Physical Therapy Treatment  Patient Details  Name: Alejandro Beltran MRN: [id number] Date of Birth: 1966/04/06 Referring Provider (PT): Dr. Chesley Mires   Encounter Date: 11/30/2019  PT End of Session - 12/01/19 0743    Visit Number  31    Number of Visits  36    Date for PT Re-Evaluation  12/07/19    Authorization Type  Eval: 09/14/19    PT Start Time  1445    PT Stop Time  1515    PT Time Calculation (min)  30 min    Equipment Utilized During Treatment  Gait belt    Activity Tolerance  Patient tolerated treatment well    Behavior During Therapy  [facility] for tasks assessed/performed       Past Medical History:  Diagnosis Date  . Acute medial meniscal tear    left  . Acute meniscal tear of knee   . Allergy   . Glaucoma   . Peanut allergy     Past Surgical History:  Procedure Laterality Date  . EYE SURGERY     rt retina sx  . MENISCUS REPAIR  08/2015    There were no vitals filed for this visit.  Subjective Assessment - 11/30/19 1445    Subjective  Pt reports that he is doing well today. Denies any pain upon arrival. He is headed over to exercise in the gym after therapy session. Pt has been consistent with his HEP. No specific questions or concerns.    Pertinent History  Pt underwent L TKR 08/15/19. He started OP PT 2 days after surgery at [facility] in [place] however due to the distance of the drive he transferred his care to [facility]. Pt reports that his surgeon would like for him to get therapy 3 days/week and they were only able to schedule him for two days per week so he is transferring his care to [facility]. Pt has been performing his HEP at home which includes static L knee flexion and extension stretches as well as additional LLE strengthening. He continues to utilize his Polar Care as well. Pt had a meniscal  repair of L knee in 2014 as well as a meniscal repair to his R knee in 2016. Prior to surgery he reports pain but fulll L knee range of motion. Other than his surgery pt denies any other recent changes to his health.    Currently in Pain?  No/denies          TREATMENT   Manual Therapy NuStep L0, seat position60moing to 717f gentle L knee range of motion during history x 5 minutes; Step stretch 45s hold x 3; Prone L knee passive extension stretch with5# ankle weight, towel under thigh, while therapist performs STM and trigger point release to L hamstrings x 5 minutes; L patella inferior,superior, medial, and lateralmobilizationsgrade III,20s/bout x2boutseach; L knee extension stretch with active quad set and overpressure by therapist 10s hold x3; L knee femur on tibia AP mobilizations at end range extension grade III, 20s/bout x 2 bouts; L knee tibia on femur ER mobilizations at end range extension grade III, 20s/boutx 2bouts; Hooklying L tibia on femur AP mobilizations at end range flexion, grade III, 20s/bout x 4 bouts; Hooklying L knee flexion stretch 10s hold x 5; Seated L knee MET flexion stretch 5s hold x 3, performed twice;   Pt  educated throughout session about proper posture and technique with exercises. Improved exercise technique, movement at target joints, use of target muscles after min to mod verbal, visual, tactile cues.   Ptdemonstrateshigh motivationduring session today.He arrived late so session was abbreviated. He continues exercising at the gym regularly so therapy session focused primarily on range of motion. Pt has been aggressively working on range of motion at home and therapist has also utilized multiple stretching and soft tissue techniques to work on his range of motion. He does appear to be making some slight improvements in resting L knee extension. Flexion remains very limited. Hewould benefit from further from skilled PT  intervention in order to address mobility, strength, and range of motiondeficits in order to return to full function at home.                       PT Short Term Goals - 11/28/19 1502      PT SHORT TERM GOAL #1   Title  Pt will be independent with HEP in order to decrease knee pain and increase strength in order to improve pain-free function.    Time  6    Period  Weeks    Status  Achieved        PT Long Term Goals - 11/28/19 1502      PT LONG TERM GOAL #1   Title  Pt will increase LEFS to >60/80 in order to demonstrate significant improvement in lower extremity function.    Baseline  09/14/19: to be completed at next appointment 12/28: 13/80; 10/10/19: 29/80; 11/28/19: 30/80    Time  12    Period  Weeks    Status  Partially Met    Target Date  12/07/19      PT LONG TERM GOAL #2   Title  Pt will decrease worst pain as reported on NPRS by at least 3 points in order to demonstrate clinically significant reduction in ankle/foot pain.    Baseline  09/14/19: worst: 8/10; 10/10/19: 6/10; 11/28/19: 8/10;    Time  12    Period  Weeks    Status  On-going    Target Date  12/07/19      PT LONG TERM GOAL #3   Title  Pt will increase strength of L hip abduction and adduction by at least 1/2 MMT grade in order to demonstrate improvement in strength and function    Baseline  09/14/19: abduction: 4/5, adduction: 4-/5; 10/10/19: unchanged; 11/03/19: deferred; 11/28/19: abduction: 4/5, adduction: 4/5;    Time  12    Period  Weeks    Status  Partially Met    Target Date  12/07/19      PT LONG TERM GOAL #4   Title  Pt will improve L knee range of motion to within 5 degrees of neutral extension and greater than 115 degrees of flexion in order to normalize gait and stair ascend/descend    Baseline  09/13/29: lacking 14 degrees extension, 77 degrees flexion; 10/10/19: Lacking 6 degrees extension to 78 degrees flexion; 11/03/19: lacking 2 degrees of extension to 83 degrees flexion; 11/28/19:  lacking 2 degrees of extension to 83 degrees flexion    Time  12    Period  Weeks    Status  Partially Met    Target Date  12/07/19      PT LONG TERM GOAL #5   Title  Patient will perform the 5x STS without UE support with equal weight  acceptance in LE's in <10 seconds to demonstrate functional strength and mobility.    Baseline  12/28: 14 seconds with no uE suppot, RLE significantly used    Time  12    Period  Weeks    Status  Deferred    Target Date  12/07/19            Plan - 12/01/19 0744    Clinical Impression Statement  Pt demonstrates high motivation during session today. He arrived late so session was abbreviated. He continues exercising at the gym regularly so therapy session focused primarily on range of motion. Pt has been aggressively working on range of motion at home and therapist has also utilized multiple stretching and soft tissue techniques to work on his range of motion. He does appear to be making some slight improvements in resting L knee extension. Flexion remains very limited. He would benefit from further from skilled PT intervention in order to address mobility, strength, and range of motion deficits in order to return to full function at home.    Personal Factors and Comorbidities  Comorbidity 1;Past/Current Experience    Examination-Activity Limitations  Carry;Caring for Others;Locomotion Level;Stairs;Squat    Examination-Participation Restrictions  Community Activity;Yard Work    Rehab Potential  Good    PT Frequency  3x / week    PT Duration  12 weeks    PT Treatment/Interventions  ADLs/Self Care Home Management;Aquatic Therapy;Biofeedback;Canalith Repostioning;Cryotherapy;Electrical Stimulation;Iontophoresis 36m/ml Dexamethasone;Moist Heat;Traction;Ultrasound;Gait training;DME Instruction;Stair training;Therapeutic exercise;Therapeutic activities;Balance training;Neuromuscular re-education;Patient/family education;Manual techniques;Passive range of  motion;Scar mobilization;Dry needling;Vestibular;Joint Manipulations    PT Next Visit Plan  Progress note; Continue working on knee flexion and extension ROM    PT Home Exercise Plan  Medbridge Access Code: [email or web address], also discussed performing heel prop in seated position in kitchen with foot propped on chair (nothing underneath knee) x 3 min during meal times    Consulted and Agree with Plan of Care  Patient       Patient will benefit from skilled therapeutic intervention in order to improve the following deficits and impairments:  Abnormal gait, Decreased balance, Decreased strength, Difficulty walking, Decreased range of motion, Pain  Visit Diagnosis: Muscle weakness (generalized)  Chronic pain of left knee     Problem List Patient Active Problem List   Diagnosis Date Noted  . Left shoulder pain 12/13/2017  . Pre-diabetes 01/06/2017  . Snoring 04/02/2016  . Osteoarthritis of left knee 12/22/2014  . Peanut allergy   . Acute meniscal tear of knee   . Glaucoma   . Acute medial meniscal tear   . Right eye injury 11/11/2012   Alejandro GroutPT, DPT, GCS  Shaneeka Scarboro 12/01/2019, 7:48 AM  [place] [facility] [address] [address]Phone: [phone]  Fax:  [phone] Name: IAARO MEYERSMRN: [id number] of Birth: [birth year]

## 2019-12-01 NOTE — Therapy (Signed)
[place] MAIN [facility] SERVICES [place], [place], [address] Phone: [phone]   Fax:  [phone]  Physical Therapy Treatment  Patient Details  Name: Alejandro Beltran MRN: [id number] Date of Birth: 1965/10/10 Referring Provider (PT): Dr. Chesley Mires   Encounter Date: 12/01/2019  PT End of Session - 12/01/19 1525    Visit Number  32    Number of Visits  36    Date for PT Re-Evaluation  12/07/19    Authorization Type  Eval: 09/14/19    PT Start Time  1522    PT Stop Time  1600    PT Time Calculation (min)  38 min    Equipment Utilized During Treatment  Gait belt    Activity Tolerance  Patient tolerated treatment well    Behavior During Therapy  [facility] for tasks assessed/performed       Past Medical History:  Diagnosis Date  . Acute medial meniscal tear    left  . Acute meniscal tear of knee   . Allergy   . Glaucoma   . Peanut allergy     Past Surgical History:  Procedure Laterality Date  . EYE SURGERY     rt retina sx  . MENISCUS REPAIR  08/2015    There were no vitals filed for this visit.  Subjective Assessment - 12/01/19 1524    Subjective  Pt reports that he is doing well today. Denies any pain upon arrival. He is headed over to exercise in the gym after therapy session. Pt has been consistent with his HEP. No specific questions or concerns.    Pertinent History  Pt underwent L TKR 08/15/19. He started OP PT 2 days after surgery at [facility] in [place] however due to the distance of the drive he transferred his care to [facility]. Pt reports that his surgeon would like for him to get therapy 3 days/week and they were only able to schedule him for two days per week so he is transferring his care to [facility]. Pt has been performing his HEP at home which includes static L knee flexion and extension stretches as well as additional LLE strengthening. He continues to utilize his Polar Care as well. Pt had a meniscal  repair of L knee in 2014 as well as a meniscal repair to his R knee in 2016. Prior to surgery he reports pain but fulll L knee range of motion. Other than his surgery pt denies any other recent changes to his health.    Currently in Pain?  No/denies          TREATMENT   Manual Therapy Step stretch 45s hold x 3; Prone L knee passive extension stretch with5# ankle weight, towel under thigh, while therapist performs STM and trigger point release to L hamstrings x 5 minutes; L patella inferior,superior, medial, and lateralmobilizationsgrade III,20s/bout x2boutseach; L knee extension stretch with active quad set and overpressure by therapist 10s hold x3; L knee femur on tibia AP mobilizations at end range extension grade III, 20s/bout x 2 bouts; L knee tibia on femur ER mobilizations at end range extension grade III, 20s/boutx 2bouts; Hooklying L tibia on femur AP mobilizations at end range flexion, grade III, 20s/bout x 4 bouts; Hooklying L knee flexion stretch10s hold x 5; Seated L knee MET flexion stretch 5s hold x 3, performed three times; L knee AAROM with overpressure: extension: -9 resting to -2 with overpressure, flexion: 90 degrees;   Pt educated throughout  session about proper posture and technique with exercises. Improved exercise technique, movement at target joints, use of target muscles after min to mod verbal, visual, tactile cues.   Ptdemonstrateshigh motivationduring session today.  Pt has been aggressivelyworking on range of motion at home and therapist has also utilized multiple stretching and soft tissue techniques to work on his range of motion. His range of motion has been demonstrating improvement and he is able to get to 90 degrees of flexion today which is a big difference over the last couple weeks. Hewould benefit from further from skilled PT intervention in order to address mobility, strength, and range of motiondeficits in order to return to  full function at home.                       PT Short Term Goals - 11/28/19 1502      PT SHORT TERM GOAL #1   Title  Pt will be independent with HEP in order to decrease knee pain and increase strength in order to improve pain-free function.    Time  6    Period  Weeks    Status  Achieved        PT Long Term Goals - 11/28/19 1502      PT LONG TERM GOAL #1   Title  Pt will increase LEFS to >60/80 in order to demonstrate significant improvement in lower extremity function.    Baseline  09/14/19: to be completed at next appointment 12/28: 13/80; 10/10/19: 29/80; 11/28/19: 30/80    Time  12    Period  Weeks    Status  Partially Met    Target Date  12/07/19      PT LONG TERM GOAL #2   Title  Pt will decrease worst pain as reported on NPRS by at least 3 points in order to demonstrate clinically significant reduction in ankle/foot pain.    Baseline  09/14/19: worst: 8/10; 10/10/19: 6/10; 11/28/19: 8/10;    Time  12    Period  Weeks    Status  On-going    Target Date  12/07/19      PT LONG TERM GOAL #3   Title  Pt will increase strength of L hip abduction and adduction by at least 1/2 MMT grade in order to demonstrate improvement in strength and function    Baseline  09/14/19: abduction: 4/5, adduction: 4-/5; 10/10/19: unchanged; 11/03/19: deferred; 11/28/19: abduction: 4/5, adduction: 4/5;    Time  12    Period  Weeks    Status  Partially Met    Target Date  12/07/19      PT LONG TERM GOAL #4   Title  Pt will improve L knee range of motion to within 5 degrees of neutral extension and greater than 115 degrees of flexion in order to normalize gait and stair ascend/descend    Baseline  09/13/29: lacking 14 degrees extension, 77 degrees flexion; 10/10/19: Lacking 6 degrees extension to 78 degrees flexion; 11/03/19: lacking 2 degrees of extension to 83 degrees flexion; 11/28/19: lacking 2 degrees of extension to 83 degrees flexion    Time  12    Period  Weeks    Status   Partially Met    Target Date  12/07/19      PT LONG TERM GOAL #5   Title  Patient will perform the 5x STS without UE support with equal weight acceptance in LE's in <10 seconds to demonstrate functional strength and mobility.  Baseline  12/28: 14 seconds with no uE suppot, RLE significantly used    Time  12    Period  Weeks    Status  Deferred    Target Date  12/07/19            Plan - 12/01/19 1525    Clinical Impression Statement  Pt demonstrates high motivation during session today.  Pt has been aggressively working on range of motion at home and therapist has also utilized multiple stretching and soft tissue techniques to work on his range of motion. His range of motion has been demonstrating improvement and he is able to get to 90 degrees of flexion today which is a big difference over the last couple weeks. He would benefit from further from skilled PT intervention in order to address mobility, strength, and range of motion deficits in order to return to full function at home.    Personal Factors and Comorbidities  Comorbidity 1;Past/Current Experience    Examination-Activity Limitations  Carry;Caring for Others;Locomotion Level;Stairs;Squat    Examination-Participation Restrictions  Community Activity;Yard Work    Rehab Potential  Good    PT Frequency  3x / week    PT Duration  12 weeks    PT Treatment/Interventions  ADLs/Self Care Home Management;Aquatic Therapy;Biofeedback;Canalith Repostioning;Cryotherapy;Electrical Stimulation;Iontophoresis 39m/ml Dexamethasone;Moist Heat;Traction;Ultrasound;Gait training;DME Instruction;Stair training;Therapeutic exercise;Therapeutic activities;Balance training;Neuromuscular re-education;Patient/family education;Manual techniques;Passive range of motion;Scar mobilization;Dry needling;Vestibular;Joint Manipulations    PT Next Visit Plan  Continue working on knee flexion and extension ROM    PT Home Exercise Plan  Medbridge Access Code:  [email or web address], also discussed performing heel prop in seated position in kitchen with foot propped on chair (nothing underneath knee) x 3 min during meal times    Consulted and Agree with Plan of Care  Patient       Patient will benefit from skilled therapeutic intervention in order to improve the following deficits and impairments:  Abnormal gait, Decreased balance, Decreased strength, Difficulty walking, Decreased range of motion, Pain  Visit Diagnosis: Muscle weakness (generalized)  Chronic pain of left knee     Problem List Patient Active Problem List   Diagnosis Date Noted  . Left shoulder pain 12/13/2017  . Pre-diabetes 01/06/2017  . Snoring 04/02/2016  . Osteoarthritis of left knee 12/22/2014  . Peanut allergy   . Acute meniscal tear of knee   . Glaucoma   . Acute medial meniscal tear   . Right eye injury 11/11/2012   JPhillips GroutPT, DPT, GCS  Christl Fessenden 12/02/2019, 9:40 AM  [place] [facility] [address]RT[address] [place] [address]Phone: [phone]  Fax:  [phone] Name: ICORRIGAN KRETSCHMERMRN: [id number] of Birth: [birth year]

## 2019-12-05 ENCOUNTER — Ambulatory Visit

## 2019-12-07 ENCOUNTER — Other Ambulatory Visit: Payer: Self-pay

## 2019-12-07 ENCOUNTER — Ambulatory Visit

## 2019-12-07 DIAGNOSIS — M25562 Pain in left knee: Secondary | ICD-10-CM

## 2019-12-07 DIAGNOSIS — G8929 Other chronic pain: Secondary | ICD-10-CM

## 2019-12-07 DIAGNOSIS — M6281 Muscle weakness (generalized): Secondary | ICD-10-CM

## 2019-12-08 ENCOUNTER — Other Ambulatory Visit: Payer: Self-pay

## 2019-12-08 ENCOUNTER — Ambulatory Visit

## 2019-12-08 DIAGNOSIS — M6281 Muscle weakness (generalized): Secondary | ICD-10-CM

## 2019-12-08 DIAGNOSIS — M25562 Pain in left knee: Secondary | ICD-10-CM | POA: Diagnosis not present

## 2019-12-08 DIAGNOSIS — G8929 Other chronic pain: Secondary | ICD-10-CM

## 2019-12-08 NOTE — Therapy (Signed)
[place] MAIN [facility] SERVICES [address] [place], [place], [address] Phone: [phone]   Fax:  [phone]  Physical Therapy Treatment  Patient Details  Name: Alejandro Beltran MRN: [id number] Date of Birth: 1966/08/14 Referring Provider (PT): Dr. Chesley Mires   Encounter Date: 12/08/2019  PT End of Session - 12/08/19 1451    Visit Number  34    Number of Visits  60    Date for PT Re-Evaluation  02/01/20    Authorization Type  Eval: 09/14/19    PT Start Time  1400    PT Stop Time  1430    PT Time Calculation (min)  30 min    Equipment Utilized During Treatment  Gait belt    Activity Tolerance  Patient tolerated treatment well    Behavior During Therapy  [facility] for tasks assessed/performed       Past Medical History:  Diagnosis Date  . Acute medial meniscal tear    left  . Acute meniscal tear of knee   . Allergy   . Glaucoma   . Peanut allergy     Past Surgical History:  Procedure Laterality Date  . EYE SURGERY     rt retina sx  . MENISCUS REPAIR  08/2015    There were no vitals filed for this visit.  Subjective Assessment - 12/08/19 1449    Subjective  Pt reports that he is doing well today. Denies any knee pain upon arrival today but reports he did have some sharp knee pain when he exited the car today. He is headed over to exercise in the gym after therapy session. Pt has been consistent with his HEP. No specific questions or concerns.    Pertinent History  Pt underwent L TKR 08/15/19. He started OP PT 2 days after surgery at [facility] in [place] however due to the distance of the drive he transferred his care to [facility]. Pt reports that his surgeon would like for him to get therapy 3 days/week and they were only able to schedule him for two days per week so he is transferring his care to [facility]. Pt has been performing his HEP at home which includes static L knee flexion and extension stretches as well as additional  LLE strengthening. He continues to utilize his Polar Care as well. Pt had a meniscal repair of L knee in 2014 as well as a meniscal repair to his R knee in 2016. Prior to surgery he reports pain but fulll L knee range of motion. Other than his surgery pt denies any other recent changes to his health.    Currently in Pain?  No/denies    Pain Onset  --            TREATMENT   Manual Therapy Prone L knee passive extension stretch with5# ankle weight, towel under thigh, while therapist performs STM and trigger point release to L hamstrings x 5 minutes; L patella inferior,superior, medial, and lateralmobilizationsgrade III,20s/bout x2boutseach; L knee extension stretch with active quad set and overpressure by therapist 10s hold x3; L knee femur on tibia AP mobilizations at end range extension grade III, 20s/bout x2bouts; L knee tibia on femur ER mobilizations at end range extension grade III, 20s/boutx2bouts; Hooklying L tibia on femur AP mobilizations at end range flexion, grade III, 20s/bout x 4 bouts; Education with patient regarding post-op TKR range of motion interventions  Pt educated throughout session about proper posture and technique with exercises. Improved exercise  technique, movement at target joints, use of target muscles after min to mod verbal, visual, tactile cues.   Ptarrived late for session so time was limited. Pt demonstrateshigh motivationduring session today.Heis now exercising at the gym so therapy sessions have focused primarily on range of motion. He continues to make gains in strength but his L knee range of motion remains significantly limited particularly in flexion. Pt has been aggressivelyworking on range of motion at home and therapist has also utilized multiple stretching and soft tissue techniques to work on his range of motion. Therapist educated pt today regarding post-op TKR range of motion evidence and interventions. Hewould benefit  from further from skilled PT intervention in order to address mobility, strength, and range of motiondeficits in order to return to full function at home.                      PT Short Term Goals - 12/08/19 0834      PT SHORT TERM GOAL #1   Title  Pt will be independent with HEP in order to decrease knee pain and increase strength in order to improve pain-free function.    Time  6    Period  Weeks    Status  Achieved        PT Long Term Goals - 12/08/19 0834      PT LONG TERM GOAL #1   Title  Pt will increase LEFS to >60/80 in order to demonstrate significant improvement in lower extremity function.    Baseline  09/14/19: to be completed at next appointment 12/28: 13/80; 10/10/19: 29/80; 11/28/19: 30/80    Time  8    Period  Weeks    Status  Partially Met    Target Date  02/01/20      PT LONG TERM GOAL #2   Title  Pt will decrease worst pain as reported on NPRS by at least 3 points in order to demonstrate clinically significant reduction in ankle/foot pain.    Baseline  09/14/19: worst: 8/10; 10/10/19: 6/10; 11/28/19: 8/10;    Time  8    Period  Weeks    Status  On-going    Target Date  02/01/20      PT LONG TERM GOAL #3   Title  Pt will increase strength of L hip abduction and adduction by at least 1/2 MMT grade in order to demonstrate improvement in strength and function    Baseline  09/14/19: abduction: 4/5, adduction: 4-/5; 10/10/19: unchanged; 11/03/19: deferred; 11/28/19: abduction: 4/5, adduction: 4/5;    Time  8    Period  Weeks    Status  Partially Met    Target Date  02/01/20      PT LONG TERM GOAL #4   Title  Pt will improve L knee range of motion to within 5 degrees of neutral extension and greater than 115 degrees of flexion in order to normalize gait and stair ascend/descend    Baseline  09/13/29: lacking 14 degrees extension, 77 degrees flexion; 10/10/19: Lacking 6 degrees extension to 78 degrees flexion; 11/03/19: lacking 2 degrees of extension to 83  degrees flexion; 11/28/19: lacking 2 degrees of extension to 83 degrees flexion    Time  8    Period  Weeks    Status  Partially Met    Target Date  02/01/20      PT LONG TERM GOAL #5   Title  Patient will perform the 5x STS without UE support  with equal weight acceptance in LE's in <10 seconds to demonstrate functional strength and mobility.    Baseline  12/28: 14 seconds with no uE suppot, RLE significantly used    Time  8    Period  Weeks    Status  Deferred    Target Date  02/01/20            Plan - 12/08/19 1455    Clinical Impression Statement  Pt arrived late for session so time was limited. Pt demonstrates high motivation during session today. He is now exercising at the gym so therapy sessions have focused primarily on range of motion. He continues to make gains in strength but his L knee range of motion remains significantly limited particularly in flexion. Pt has been aggressively working on range of motion at home and therapist has also utilized multiple stretching and soft tissue techniques to work on his range of motion. Therapist educated pt today regarding post-op TKR range of motion evidence and interventions. He would benefit from further from skilled PT intervention in order to address mobility, strength, and range of motion deficits in order to return to full function at home.    Personal Factors and Comorbidities  Comorbidity 1;Past/Current Experience    Examination-Activity Limitations  Carry;Caring for Others;Locomotion Level;Stairs;Squat    Examination-Participation Restrictions  Community Activity;Yard Work    Rehab Potential  Good    PT Frequency  3x / week    PT Duration  8 weeks    PT Treatment/Interventions  ADLs/Self Care Home Management;Aquatic Therapy;Biofeedback;Canalith Repostioning;Cryotherapy;Electrical Stimulation;Iontophoresis 4mg/ml Dexamethasone;Moist Heat;Traction;Ultrasound;Gait training;DME Instruction;Stair training;Therapeutic  exercise;Therapeutic activities;Balance training;Neuromuscular re-education;Patient/family education;Manual techniques;Passive range of motion;Scar mobilization;Dry needling;Vestibular;Joint Manipulations    PT Next Visit Plan  Continue working on knee flexion and extension ROM    PT Home Exercise Plan  Medbridge Access Code: [email or web address], also discussed performing heel prop in seated position in kitchen with foot propped on chair (nothing underneath knee) x 3 min during meal times    Consulted and Agree with Plan of Care  Patient       Patient will benefit from skilled therapeutic intervention in order to improve the following deficits and impairments:  Abnormal gait, Decreased balance, Decreased strength, Difficulty walking, Decreased range of motion, Pain  Visit Diagnosis: Muscle weakness (generalized)  Chronic pain of left knee     Problem List Patient Active Problem List   Diagnosis Date Noted  . Left shoulder pain 12/13/2017  . Pre-diabetes 01/06/2017  . Snoring 04/02/2016  . Osteoarthritis of left knee 12/22/2014  . Peanut allergy   . Acute meniscal tear of knee   . Glaucoma   . Acute medial meniscal tear   . Right eye injury 11/11/2012   Jason D Huprich PT, DPT, GCS  Huprich,Jason 12/08/2019, 3:01 PM  [facility] MA[facility] REHAB SERVICES [address] [place], [place], [address] Phone: [phone]   Fax:  [phone]  Name: Alejandro Beltran MRN: [id number] Date of Birth: 09/12/1966   

## 2019-12-08 NOTE — Therapy (Signed)
[place] MAIN [facility] SERVICES [address] [place], [place], [address] Phone: [phone]   Fax:  [phone]  Physical Therapy Treatment/Recertification   Patient Details  Name: Alejandro Beltran MRN: [id number] Date of Birth: 1965/11/17 Referring Provider (PT): Dr. Chesley Mires   Encounter Date: 12/07/2019  PT End of Session - 12/07/19 1450    Visit Number  33    Number of Visits  60    Date for PT Re-Evaluation  02/01/20    Authorization Type  Eval: 09/14/19    PT Start Time  1438    PT Stop Time  1520    PT Time Calculation (min)  42 min    Equipment Utilized During Treatment  Gait belt    Activity Tolerance  Patient tolerated treatment well    Behavior During Therapy  [facility] for tasks assessed/performed       Past Medical History:  Diagnosis Date  . Acute medial meniscal tear    left  . Acute meniscal tear of knee   . Allergy   . Glaucoma   . Peanut allergy     Past Surgical History:  Procedure Laterality Date  . EYE SURGERY     rt retina sx  . MENISCUS REPAIR  08/2015    There were no vitals filed for this visit.  Subjective Assessment - 12/07/19 1447    Subjective  Pt reports that he is doing well today. Reports 4/10 L knee pain upon arrival today. He is headed over to exercise in the gym after therapy session. Pt has been consistent with his HEP. No specific questions or concerns.    Pertinent History  Pt underwent L TKR 08/15/19. He started OP PT 2 days after surgery at [facility] in [place] however due to the distance of the drive he transferred his care to [facility]. Pt reports that his surgeon would like for him to get therapy 3 days/week and they were only able to schedule him for two days per week so he is transferring his care to [facility]. Pt has been performing his HEP at home which includes static L knee flexion and extension stretches as well as additional LLE strengthening. He continues to utilize his [employer]  [facility] as well. Pt had a meniscal repair of L knee in 2014 as well as a meniscal repair to his R knee in 2016. Prior to surgery he reports pain but fulll L knee range of motion. Other than his surgery pt denies any other recent changes to his health.    Currently in Pain?  Yes    Pain Score  4     Pain Location  Knee    Pain Orientation  Left    Pain Descriptors / Indicators  Aching    Pain Type  Surgical pain    Pain Onset  More than a month ago    Pain Frequency  Intermittent         TREATMENT   Manual Therapy NuStep L0 seat position 10 progressing to 7 for dynamic range of motion during history x 5 minutes (3 minutes unbilled); Pt requested therapist to observe gait, he demonstrates slight decrease in L step length. Still lacks full range terminal knee extension prior to initial contact and has slightly decreased stance time on LLE. Slight decrease in L push off force resulting in decreased L toe to floor clearance.  Step stretch 45s hold x3; L patella inferior,superior, medial, and lateralmobilizationsgrade III,20s/bout x2boutseach; L knee  extension stretch with active quad set and overpressure by therapist 10s hold x3; L knee femur on tibia AP mobilizations at end range extension grade III, 20s/bout x2bouts; L knee tibia on femur ER mobilizations at end range extension grade III, 20s/boutx2bouts; Hooklying L tibia on femur AP mobilizations at end range flexion, grade III, 20s/bout x 4 bouts; Seated L knee MET flexion stretch 5s hold x 3, performed three times;   Pt educated throughout session about proper posture and technique with exercises. Improved exercise technique, movement at target joints, use of target muscles after min to mod verbal, visual, tactile cues.   Ptdemonstrateshigh motivationduring session today.He is now exercising at the gym so therapy sessions have focused primarily on range of motion. He continues to make gains in strength but his L  knee range of motion remains significantly limited particularly in flexion. However he has made gains over the last couple sessions and his last measurements with overpressure were -2 to 90 degrees His resting L knee extension is around -10 degrees but he is able to achieve -2 degrees with overpressure by therapist. L knee flexion remains his most significant limitation. Pt has been aggressively working on range of motion at home and therapist has also utilized multiple stretching and soft tissue techniques to work on his range of motion. Frequency will decrease to 2x/wk over the next 8 weeks. Hewould benefit from further from skilled PT intervention in order to address mobility, strength, and range of motiondeficits in order to return to full function at home.            PT Short Term Goals - 12/08/19 0834      PT SHORT TERM GOAL #1   Title  Pt will be independent with HEP in order to decrease knee pain and increase strength in order to improve pain-free function.    Time  6    Period  Weeks    Status  Achieved        PT Long Term Goals - 12/08/19 0834      PT LONG TERM GOAL #1   Title  Pt will increase LEFS to >60/80 in order to demonstrate significant improvement in lower extremity function.    Baseline  09/14/19: to be completed at next appointment 12/28: 13/80; 10/10/19: 29/80; 11/28/19: 30/80    Time  8    Period  Weeks    Status  Partially Met    Target Date  02/01/20      PT LONG TERM GOAL #2   Title  Pt will decrease worst pain as reported on NPRS by at least 3 points in order to demonstrate clinically significant reduction in ankle/foot pain.    Baseline  09/14/19: worst: 8/10; 10/10/19: 6/10; 11/28/19: 8/10;    Time  8    Period  Weeks    Status  On-going    Target Date  02/01/20      PT LONG TERM GOAL #3   Title  Pt will increase strength of L hip abduction and adduction by at least 1/2 MMT grade in order to demonstrate improvement in strength and function     Baseline  09/14/19: abduction: 4/5, adduction: 4-/5; 10/10/19: unchanged; 11/03/19: deferred; 11/28/19: abduction: 4/5, adduction: 4/5;    Time  8    Period  Weeks    Status  Partially Met    Target Date  02/01/20      PT LONG TERM GOAL #4   Title  Pt will improve L  knee range of motion to within 5 degrees of neutral extension and greater than 115 degrees of flexion in order to normalize gait and stair ascend/descend    Baseline  09/13/29: lacking 14 degrees extension, 77 degrees flexion; 10/10/19: Lacking 6 degrees extension to 78 degrees flexion; 11/03/19: lacking 2 degrees of extension to 83 degrees flexion; 11/28/19: lacking 2 degrees of extension to 83 degrees flexion    Time  8    Period  Weeks    Status  Partially Met    Target Date  02/01/20      PT LONG TERM GOAL #5   Title  Patient will perform the 5x STS without UE support with equal weight acceptance in LE's in <10 seconds to demonstrate functional strength and mobility.    Baseline  12/28: 14 seconds with no uE suppot, RLE significantly used    Time  8    Period  Weeks    Status  Deferred    Target Date  02/01/20            Plan - 12/08/19 5056    Clinical Impression Statement  Pt demonstrates high motivation during session today. He is now exercising at the gym so therapy sessions have focused primarily on range of motion. He continues to make gains in strength but his L knee range of motion remains significantly limited particularly in flexion. However he has made gains over the last couple sessions and his last measurements with overpressure were -2 to 90 degrees His resting L knee extension is around -10 degrees but he is able to achieve -2 degrees with overpressure by therapist. L knee flexion remains his most significant limitation. Pt has been aggressively working on range of motion at home and therapist has also utilized multiple stretching and soft tissue techniques to work on his range of motion. Frequency will decrease  to 2x/wk over the next 8 weeks. He would benefit from further from skilled PT intervention in order to address mobility, strength, and range of motion deficits in order to return to full function at home.    Personal Factors and Comorbidities  Comorbidity 1;Past/Current Experience    Examination-Activity Limitations  Carry;Caring for Others;Locomotion Level;Stairs;Squat    Examination-Participation Restrictions  Community Activity;Yard Work    Rehab Potential  Good    PT Frequency  3x / week    PT Duration  8 weeks    PT Treatment/Interventions  ADLs/Self Care Home Management;Aquatic Therapy;Biofeedback;Canalith Repostioning;Cryotherapy;Electrical Stimulation;Iontophoresis 3m/ml Dexamethasone;Moist Heat;Traction;Ultrasound;Gait training;DME Instruction;Stair training;Therapeutic exercise;Therapeutic activities;Balance training;Neuromuscular re-education;Patient/family education;Manual techniques;Passive range of motion;Scar mobilization;Dry needling;Vestibular;Joint Manipulations    PT Next Visit Plan  Continue working on knee flexion and extension ROM    PT Home Exercise Plan  Medbridge Access Code: [email or web address], also discussed performing heel prop in seated position in kitchen with foot propped on chair (nothing underneath knee) x 3 min during meal times    Consulted and Agree with Plan of Care  Patient       Patient will benefit from skilled therapeutic intervention in order to improve the following deficits and impairments:  Abnormal gait, Decreased balance, Decreased strength, Difficulty walking, Decreased range of motion, Pain  Visit Diagnosis: Muscle weakness (generalized)  Chronic pain of left knee     Problem List Patient Active Problem List   Diagnosis Date Noted  . Left shoulder pain 12/13/2017  . Pre-diabetes 01/06/2017  . Snoring 04/02/2016  . Osteoarthritis of left knee 12/22/2014  . Peanut allergy   . Acute meniscal tear  of knee   . Glaucoma   . Acute medial meniscal  tear   . Right eye injury 11/11/2012   Phillips Grout PT, DPT, GCS  Esraa Seres 12/08/2019, 8:43 AM  [place] MAIN [facility] SERVICES [address] [place], [place], [address] Phone: [phone]   Fax:  [phone]  Name: Alejandro Beltran MRN: [id number] Date of Birth: Nov 03, 1965

## 2019-12-12 ENCOUNTER — Other Ambulatory Visit: Payer: Self-pay

## 2019-12-12 ENCOUNTER — Ambulatory Visit (INDEPENDENT_AMBULATORY_CARE_PROVIDER_SITE_OTHER): Admitting: Family Medicine

## 2019-12-12 ENCOUNTER — Ambulatory Visit

## 2019-12-12 ENCOUNTER — Encounter: Payer: Self-pay | Admitting: Family Medicine

## 2019-12-12 VITALS — BP 140/90 | HR 86 | Temp 96.0°F | Resp 16 | Ht 68.0 in | Wt 156.0 lb

## 2019-12-12 DIAGNOSIS — M25612 Stiffness of left shoulder, not elsewhere classified: Secondary | ICD-10-CM

## 2019-12-12 DIAGNOSIS — M25562 Pain in left knee: Secondary | ICD-10-CM

## 2019-12-12 DIAGNOSIS — M6281 Muscle weakness (generalized): Secondary | ICD-10-CM

## 2019-12-12 DIAGNOSIS — G8929 Other chronic pain: Secondary | ICD-10-CM

## 2019-12-12 DIAGNOSIS — M2550 Pain in unspecified joint: Secondary | ICD-10-CM | POA: Diagnosis not present

## 2019-12-12 NOTE — Patient Instructions (Addendum)
Good to see you today, I will let you know what Dr Dion Saucier says about seeing you
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
## 2019-12-12 NOTE — Therapy (Signed)
[place] MAIN [facility] SERVICES [address] [place], [place], [address] Phone: [phone]   Fax:  [phone]  Physical Therapy Treatment  Patient Details  Name: Alejandro Beltran MRN: [id number] Date of Birth: 31-Oct-1965 Referring Provider (PT): Dr. Chesley Mires   Encounter Date: 12/12/2019  PT End of Session - 12/12/19 1454    Visit Number  35    Number of Visits  60    Date for PT Re-Evaluation  02/01/20    Authorization Type  Eval: 09/14/19    PT Start Time  1405    PT Stop Time  1447    PT Time Calculation (min)  42 min    Equipment Utilized During Treatment  Gait belt    Activity Tolerance  Patient tolerated treatment well    Behavior During Therapy  [facility] for tasks assessed/performed       Past Medical History:  Diagnosis Date  . Acute medial meniscal tear    left  . Acute meniscal tear of knee   . Allergy   . Glaucoma   . Peanut allergy     Past Surgical History:  Procedure Laterality Date  . EYE SURGERY     rt retina sx  . MENISCUS REPAIR  08/2015    There were no vitals filed for this visit.  Subjective Assessment - 12/12/19 1404    Subjective  Pt reports that he is doing well today. Denies any knee pain upon arrival today. He is going to see his PCP and is planning to request an MRI for his L knee to see the extent of his scar tissue. He also plans to schedule an appointment with another orthopedic surgeon for a second opinion. Pt has been consistent with his HEP. No specific questions or concerns.    Pertinent History  Pt underwent L TKR 08/15/19. He started OP PT 2 days after surgery at [facility] in [place] however due to the distance of the drive he transferred his care to [facility]. Pt reports that his surgeon would like for him to get therapy 3 days/week and they were only able to schedule him for two days per week so he is transferring his care to [facility]. Pt has been performing his HEP at home which  includes static L knee flexion and extension stretches as well as additional LLE strengthening. He continues to utilize his Polar Care as well. Pt had a meniscal repair of L knee in 2014 as well as a meniscal repair to his R knee in 2016. Prior to surgery he reports pain but fulll L knee range of motion. Other than his surgery pt denies any other recent changes to his health.    Patient Stated Goals  Improve L knee range of motion and strength as well as decrease pain    Currently in Pain?  No/denies         TREATMENT   Manual Therapy Prone L knee passive extension stretch with5# ankle weight, towel under thigh, while therapist performs STM and trigger point release to L hamstrings x 5 minutes; L patella inferior,superior, medial, and lateralmobilizationsgrade III,20s/bout x4boutseach; L knee extension stretch with active quad set and overpressure by therapist 10s hold x3; L knee femur on tibia AP mobilizations at end range extension grade III, 20s/bout x4bouts; L knee tibia on femur ER mobilizations at end range extension grade III, 20s/boutx4bouts; Hooklying L tibia on femur AP mobilizations at end range flexion, grade III, 20s/bout x 4 bouts;  Seated L knee MET flexion stretch 5s hold x 3, performed three times;   Pt educated throughout session about proper posture and technique with exercises. Improved exercise technique, movement at target joints, use of target muscles after min to mod verbal, visual, tactile cues.   Ptdemonstrateshigh motivationduring session today.Heis now exercising at the gym so therapy sessions have focused primarily on range of motion. He continues to make gains in strength but his L knee range of motion remains significantly limited particularly in flexion. Pt has been aggressivelyworking on range of motion at home and therapist has also utilized multiple stretching and soft tissue techniques to work on his range of motion.  No increase  in pain during session with the exception of intense stretch during seated MET flexion stretches. Hewould benefit from further from skilled PT intervention in order to address mobility, strength, and range of motiondeficits in order to return to full function at home.                        PT Short Term Goals - 12/08/19 0834      PT SHORT TERM GOAL #1   Title  Pt will be independent with HEP in order to decrease knee pain and increase strength in order to improve pain-free function.    Time  6    Period  Weeks    Status  Achieved        PT Long Term Goals - 12/08/19 0834      PT LONG TERM GOAL #1   Title  Pt will increase LEFS to >60/80 in order to demonstrate significant improvement in lower extremity function.    Baseline  09/14/19: to be completed at next appointment 12/28: 13/80; 10/10/19: 29/80; 11/28/19: 30/80    Time  8    Period  Weeks    Status  Partially Met    Target Date  02/01/20      PT LONG TERM GOAL #2   Title  Pt will decrease worst pain as reported on NPRS by at least 3 points in order to demonstrate clinically significant reduction in ankle/foot pain.    Baseline  09/14/19: worst: 8/10; 10/10/19: 6/10; 11/28/19: 8/10;    Time  8    Period  Weeks    Status  On-going    Target Date  02/01/20      PT LONG TERM GOAL #3   Title  Pt will increase strength of L hip abduction and adduction by at least 1/2 MMT grade in order to demonstrate improvement in strength and function    Baseline  09/14/19: abduction: 4/5, adduction: 4-/5; 10/10/19: unchanged; 11/03/19: deferred; 11/28/19: abduction: 4/5, adduction: 4/5;    Time  8    Period  Weeks    Status  Partially Met    Target Date  02/01/20      PT LONG TERM GOAL #4   Title  Pt will improve L knee range of motion to within 5 degrees of neutral extension and greater than 115 degrees of flexion in order to normalize gait and stair ascend/descend    Baseline  09/13/29: lacking 14 degrees extension, 77  degrees flexion; 10/10/19: Lacking 6 degrees extension to 78 degrees flexion; 11/03/19: lacking 2 degrees of extension to 83 degrees flexion; 11/28/19: lacking 2 degrees of extension to 83 degrees flexion    Time  8    Period  Weeks    Status  Partially Met    Target  Date  02/01/20      PT LONG TERM GOAL #5   Title  Patient will perform the 5x STS without UE support with equal weight acceptance in LE's in <10 seconds to demonstrate functional strength and mobility.    Baseline  12/28: 14 seconds with no uE suppot, RLE significantly used    Time  8    Period  Weeks    Status  Deferred    Target Date  02/01/20            Plan - 12/12/19 1454    Clinical Impression Statement  Pt demonstrates high motivation during session today. He is now exercising at the gym so therapy sessions have focused primarily on range of motion. He continues to make gains in strength but his L knee range of motion remains significantly limited particularly in flexion. Pt has been aggressively working on range of motion at home and therapist has also utilized multiple stretching and soft tissue techniques to work on his range of motion.  No increase in pain during session with the exception of intense stretch during seated MET flexion stretches. He would benefit from further from skilled PT intervention in order to address mobility, strength, and range of motion deficits in order to return to full function at home.    Personal Factors and Comorbidities  Comorbidity 1;Past/Current Experience    Examination-Activity Limitations  Carry;Caring for Others;Locomotion Level;Stairs;Squat    Examination-Participation Restrictions  Community Activity;Yard Work    Rehab Potential  Good    PT Frequency  3x / week    PT Duration  8 weeks    PT Treatment/Interventions  ADLs/Self Care Home Management;Aquatic Therapy;Biofeedback;Canalith Repostioning;Cryotherapy;Electrical Stimulation;Iontophoresis 68m/ml Dexamethasone;Moist  Heat;Traction;Ultrasound;Gait training;DME Instruction;Stair training;Therapeutic exercise;Therapeutic activities;Balance training;Neuromuscular re-education;Patient/family education;Manual techniques;Passive range of motion;Scar mobilization;Dry needling;Vestibular;Joint Manipulations    PT Next Visit Plan  Continue working on knee flexion and extension ROM    PT Home Exercise Plan  Medbridge Access Code: [email or web address], also discussed performing heel prop in seated position in kitchen with foot propped on chair (nothing underneath knee) x 3 min during meal times    Consulted and Agree with Plan of Care  Patient       Patient will benefit from skilled therapeutic intervention in order to improve the following deficits and impairments:  Abnormal gait, Decreased balance, Decreased strength, Difficulty walking, Decreased range of motion, Pain  Visit Diagnosis: Muscle weakness (generalized)  Chronic pain of left knee     Problem List Patient Active Problem List   Diagnosis Date Noted  . Left shoulder pain 12/13/2017  . Pre-diabetes 01/06/2017  . Snoring 04/02/2016  . Osteoarthritis of left knee 12/22/2014  . Peanut allergy   . Acute meniscal tear of knee   . Glaucoma   . Acute medial meniscal tear   . Right eye injury 11/11/2012    Naara Kelty 12/12/2019, 3:00 PM  [place] [address] [place] [address]Phone: [phone]  Fax:  [phone] Name: Alejandro GATHRIGHTMRN: [id number] of Birth: 7January 04, 1967

## 2019-12-14 ENCOUNTER — Ambulatory Visit

## 2019-12-14 ENCOUNTER — Other Ambulatory Visit: Payer: Self-pay

## 2019-12-14 DIAGNOSIS — M25562 Pain in left knee: Secondary | ICD-10-CM

## 2019-12-14 DIAGNOSIS — G8929 Other chronic pain: Secondary | ICD-10-CM

## 2019-12-14 DIAGNOSIS — M6281 Muscle weakness (generalized): Secondary | ICD-10-CM

## 2019-12-14 NOTE — Therapy (Signed)
[place] MAIN [facility] SERVICES [address] [place], [place], [address] Phone: [phone]   Fax:  [phone]  Physical Therapy Treatment  Patient Details  Name: Alejandro Beltran MRN: [id number] Date of Birth: 04-May-1966 Referring Provider (PT): Dr. Chesley Mires   Encounter Date: 12/14/2019  PT End of Session - 12/14/19 1507    Visit Number  36    Number of Visits  60    Date for PT Re-Evaluation  02/01/20    Authorization Type  Eval: 09/14/19    PT Start Time  1400    PT Stop Time  1432    PT Time Calculation (min)  32 min    Equipment Utilized During Treatment  Gait belt    Activity Tolerance  Patient tolerated treatment well    Behavior During Therapy  [facility] for tasks assessed/performed       Past Medical History:  Diagnosis Date  . Acute medial meniscal tear    left  . Acute meniscal tear of knee   . Allergy   . Glaucoma   . Peanut allergy     Past Surgical History:  Procedure Laterality Date  . EYE SURGERY     rt retina sx  . MENISCUS REPAIR  08/2015    There were no vitals filed for this visit.  Subjective Assessment - 12/14/19 1404    Subjective  Pt reports that he is doing well today. Denies any knee pain upon arrival today. He saw his PCP yesterday. He plans to schedule an appointment with another orthopedic surgeon for a second opinion. Pt has been consistent with his HEP. No specific questions or concerns.    Pertinent History  Pt underwent L TKR 08/15/19. He started OP PT 2 days after surgery at [facility] in [place] however due to the distance of the drive he transferred his care to [facility]. Pt reports that his surgeon would like for him to get therapy 3 days/week and they were only able to schedule him for two days per week so he is transferring his care to [facility]. Pt has been performing his HEP at home which includes static L knee flexion and extension stretches as well as additional LLE strengthening. He  continues to utilize his Polar Care as well. Pt had a meniscal repair of L knee in 2014 as well as a meniscal repair to his R knee in 2016. Prior to surgery he reports pain but fulll L knee range of motion. Other than his surgery pt denies any other recent changes to his health.    Patient Stated Goals  Improve L knee range of motion and strength as well as decrease pain    Currently in Pain?  No/denies            TREATMENT   Manual Therapy Prone L knee passive extension stretch with5# ankle weight, towel under thigh, while therapist performs STM and trigger point release to L hamstrings x 5 minutes; L patella inferior,superior, medial, and lateralmobilizationsgrade III,20s/bout x3boutseach; Prone L knee flexion MET stretch 5s hold x 3, performed twice; L knee femur on tibia AP mobilizations at end range extension grade III, 20s/bout x4bouts; L knee tibia on femur ER mobilizations at end range extension grade III, 20s/boutx4bouts; Hooklying L tibia on femur AP mobilizations at end range flexion, grade III, 20s/bout x 4 bouts;   Pt educated throughout session about proper posture and technique with exercises. Improved exercise technique, movement at target joints, use of  target muscles after min to mod verbal, visual, tactile cues.   Pt arrived late for session so time was limited. Pt demonstrates high motivation during session today. He is now exercising at the gym so therapy sessions have focused primarily on range of motion. He continues to make gains in strength but his L knee range of motion remains significantly limited particularly in flexion. Pt has been aggressively working on range of motion at home and therapist has also utilized multiple stretching and soft tissue techniques to work on his range of motion. He would benefit from further from skilled PT intervention in order to address mobility, strength, and range of motion deficits in order to return to full  function at home.                        PT Short Term Goals - 12/08/19 0834      PT SHORT TERM GOAL #1   Title  Pt will be independent with HEP in order to decrease knee pain and increase strength in order to improve pain-free function.    Time  6    Period  Weeks    Status  Achieved        PT Long Term Goals - 12/08/19 0834      PT LONG TERM GOAL #1   Title  Pt will increase LEFS to >60/80 in order to demonstrate significant improvement in lower extremity function.    Baseline  09/14/19: to be completed at next appointment 12/28: 13/80; 10/10/19: 29/80; 11/28/19: 30/80    Time  8    Period  Weeks    Status  Partially Met    Target Date  02/01/20      PT LONG TERM GOAL #2   Title  Pt will decrease worst pain as reported on NPRS by at least 3 points in order to demonstrate clinically significant reduction in ankle/foot pain.    Baseline  09/14/19: worst: 8/10; 10/10/19: 6/10; 11/28/19: 8/10;    Time  8    Period  Weeks    Status  On-going    Target Date  02/01/20      PT LONG TERM GOAL #3   Title  Pt will increase strength of L hip abduction and adduction by at least 1/2 MMT grade in order to demonstrate improvement in strength and function    Baseline  09/14/19: abduction: 4/5, adduction: 4-/5; 10/10/19: unchanged; 11/03/19: deferred; 11/28/19: abduction: 4/5, adduction: 4/5;    Time  8    Period  Weeks    Status  Partially Met    Target Date  02/01/20      PT LONG TERM GOAL #4   Title  Pt will improve L knee range of motion to within 5 degrees of neutral extension and greater than 115 degrees of flexion in order to normalize gait and stair ascend/descend    Baseline  09/13/29: lacking 14 degrees extension, 77 degrees flexion; 10/10/19: Lacking 6 degrees extension to 78 degrees flexion; 11/03/19: lacking 2 degrees of extension to 83 degrees flexion; 11/28/19: lacking 2 degrees of extension to 83 degrees flexion    Time  8    Period  Weeks    Status  Partially Met     Target Date  02/01/20      PT LONG TERM GOAL #5   Title  Patient will perform the 5x STS without UE support with equal weight acceptance in LE's in <10 seconds to  demonstrate functional strength and mobility.    Baseline  12/28: 14 seconds with no uE suppot, RLE significantly used    Time  8    Period  Weeks    Status  Deferred    Target Date  02/01/20            Plan - 12/14/19 1508    Clinical Impression Statement  Pt arrived late for session so time was limited. Pt demonstrates high motivation during session today. He is now exercising at the gym so therapy sessions have focused primarily on range of motion. He continues to make gains in strength but his L knee range of motion remains significantly limited particularly in flexion. Pt has been aggressively working on range of motion at home and therapist has also utilized multiple stretching and soft tissue techniques to work on his range of motion. He would benefit from further from skilled PT intervention in order to address mobility, strength, and range of motion deficits in order to return to full function at home.    Personal Factors and Comorbidities  Comorbidity 1;Past/Current Experience    Examination-Activity Limitations  Carry;Caring for Others;Locomotion Level;Stairs;Squat    Examination-Participation Restrictions  Community Activity;Yard Work    Rehab Potential  Good    PT Frequency  3x / week    PT Duration  8 weeks    PT Treatment/Interventions  ADLs/Self Care Home Management;Aquatic Therapy;Biofeedback;Canalith Repostioning;Cryotherapy;Electrical Stimulation;Iontophoresis 22m/ml Dexamethasone;Moist Heat;Traction;Ultrasound;Gait training;DME Instruction;Stair training;Therapeutic exercise;Therapeutic activities;Balance training;Neuromuscular re-education;Patient/family education;Manual techniques;Passive range of motion;Scar mobilization;Dry needling;Vestibular;Joint Manipulations    PT Next Visit Plan  Continue working  on knee flexion and extension ROM    PT Home Exercise Plan  Medbridge Access Code: [email or web address], also discussed performing heel prop in seated position in kitchen with foot propped on chair (nothing underneath knee) x 3 min during meal times    Consulted and Agree with Plan of Care  Patient       Patient will benefit from skilled therapeutic intervention in order to improve the following deficits and impairments:  Abnormal gait, Decreased balance, Decreased strength, Difficulty walking, Decreased range of motion, Pain  Visit Diagnosis: Muscle weakness (generalized)  Chronic pain of left knee     Problem List Patient Active Problem List   Diagnosis Date Noted  . Left shoulder pain 12/13/2017  . Pre-diabetes 01/06/2017  . Snoring 04/02/2016  . Osteoarthritis of left knee 12/22/2014  . Peanut allergy   . Acute meniscal tear of knee   . Glaucoma   . Acute medial meniscal tear   . Right eye injury 11/11/2012   JPhillips GroutPT, DPT, GCS  Rozell Kettlewell 12/14/2019, 3:11 PM  CSt. XavierMAIN [facility]ERV[facility] [address] [place] [address]Phone: [phone]  Fax:  [phone] Name: Alejandro SADAMRN: [id number] of Birth: 704-26-1967

## 2019-12-15 ENCOUNTER — Ambulatory Visit

## 2019-12-15 ENCOUNTER — Other Ambulatory Visit: Payer: Self-pay

## 2019-12-15 DIAGNOSIS — M6281 Muscle weakness (generalized): Secondary | ICD-10-CM

## 2019-12-15 DIAGNOSIS — M25562 Pain in left knee: Secondary | ICD-10-CM | POA: Diagnosis not present

## 2019-12-15 DIAGNOSIS — G8929 Other chronic pain: Secondary | ICD-10-CM

## 2019-12-15 NOTE — Therapy (Signed)
[place] MAIN [facility] SERVICES [address] [place], [place], [address] Phone: [phone]   Fax:  [phone]  Physical Therapy Treatment  Patient Details  Name: Alejandro Beltran MRN: [id number] Date of Birth: Feb 04, 1966 Referring Provider (PT): Dr. Chesley Mires   Encounter Date: 12/15/2019  PT End of Session - 12/15/19 1541    Visit Number  37    Number of Visits  60    Date for PT Re-Evaluation  02/01/20    Authorization Type  Eval: 09/14/19    PT Start Time  4680    PT Stop Time  1630    PT Time Calculation (min)  33 min    Equipment Utilized During Treatment  Gait belt    Activity Tolerance  Patient tolerated treatment well    Behavior During Therapy  [facility] for tasks assessed/performed       Past Medical History:  Diagnosis Date  . Acute medial meniscal tear    left  . Acute meniscal tear of knee   . Allergy   . Glaucoma   . Peanut allergy     Past Surgical History:  Procedure Laterality Date  . EYE SURGERY     rt retina sx  . MENISCUS REPAIR  08/2015    There were no vitals filed for this visit.  Subjective Assessment - 12/15/19 1540    Subjective  Pt reports that he is doing well today. Denies any knee pain upon arrival today. Pt has been consistent with his HEP. No specific questions or concerns.    Pertinent History  Pt underwent L TKR 08/15/19. He started OP PT 2 days after surgery at [facility] in [place] however due to the distance of the drive he transferred his care to [facility]. Pt reports that his surgeon would like for him to get therapy 3 days/week and they were only able to schedule him for two days per week so he is transferring his care to [facility]. Pt has been performing his HEP at home which includes static L knee flexion and extension stretches as well as additional LLE strengthening. He continues to utilize his Polar Care as well. Pt had a meniscal repair of L knee in 2014 as well as a meniscal repair  to his R knee in 2016. Prior to surgery he reports pain but fulll L knee range of motion. Other than his surgery pt denies any other recent changes to his health.    Patient Stated Goals  Improve L knee range of motion and strength as well as decrease pain    Currently in Pain?  No/denies         TREATMENT   Manual Therapy Prone L knee passive extension stretch with5# ankle weight, towel under thigh, while therapist performs STM and trigger point release to L hamstrings x 5 minutes; L patella inferior,superior, medial, and lateralmobilizationsgrade III,20s/bout x3boutseach; L knee femur on tibia AP mobilizations at end range extension grade III, 20s/bout x4bouts; L knee tibia on femur ER mobilizations at end range extension grade III, 20s/boutx4bouts; Hooklying L tibia on femur AP mobilizations at end range flexion, grade III, 20s/bout x 4 bouts; Seated L knee MET stretch 5s contract/5s relax x 3, repeated twice;   Pt educated throughout session about proper posture and technique with exercises. Improved exercise technique, movement at target joints, use of target muscles after min to mod verbal, visual, tactile cues.   Pt arrived late for session so time was limited. Pt  demonstrates high motivation during session today. He is now exercising at the gym so therapy sessions have focused primarily on range of motion. He continues to make gains in strength but his L knee range of motion remains significantly limited particularly in flexion. Pt has been aggressively working on range of motion at home and therapist has also utilized multiple stretching and soft tissue techniques to work on his range of motion. He would benefit from further from skilled PT intervention in order to address mobility, strength, and range of motion deficits in order to return to full function at home.                        PT Short Term Goals - 12/08/19 0834      PT SHORT  TERM GOAL #1   Title  Pt will be independent with HEP in order to decrease knee pain and increase strength in order to improve pain-free function.    Time  6    Period  Weeks    Status  Achieved        PT Long Term Goals - 12/08/19 0834      PT LONG TERM GOAL #1   Title  Pt will increase LEFS to >60/80 in order to demonstrate significant improvement in lower extremity function.    Baseline  09/14/19: to be completed at next appointment 12/28: 13/80; 10/10/19: 29/80; 11/28/19: 30/80    Time  8    Period  Weeks    Status  Partially Met    Target Date  02/01/20      PT LONG TERM GOAL #2   Title  Pt will decrease worst pain as reported on NPRS by at least 3 points in order to demonstrate clinically significant reduction in ankle/foot pain.    Baseline  09/14/19: worst: 8/10; 10/10/19: 6/10; 11/28/19: 8/10;    Time  8    Period  Weeks    Status  On-going    Target Date  02/01/20      PT LONG TERM GOAL #3   Title  Pt will increase strength of L hip abduction and adduction by at least 1/2 MMT grade in order to demonstrate improvement in strength and function    Baseline  09/14/19: abduction: 4/5, adduction: 4-/5; 10/10/19: unchanged; 11/03/19: deferred; 11/28/19: abduction: 4/5, adduction: 4/5;    Time  8    Period  Weeks    Status  Partially Met    Target Date  02/01/20      PT LONG TERM GOAL #4   Title  Pt will improve L knee range of motion to within 5 degrees of neutral extension and greater than 115 degrees of flexion in order to normalize gait and stair ascend/descend    Baseline  09/13/29: lacking 14 degrees extension, 77 degrees flexion; 10/10/19: Lacking 6 degrees extension to 78 degrees flexion; 11/03/19: lacking 2 degrees of extension to 83 degrees flexion; 11/28/19: lacking 2 degrees of extension to 83 degrees flexion    Time  8    Period  Weeks    Status  Partially Met    Target Date  02/01/20      PT LONG TERM GOAL #5   Title  Patient will perform the 5x STS without UE support  with equal weight acceptance in LE's in <10 seconds to demonstrate functional strength and mobility.    Baseline  12/28: 14 seconds with no uE suppot, RLE significantly used  Time  8    Period  Weeks    Status  Deferred    Target Date  02/01/20            Plan - 12/15/19 1542    Clinical Impression Statement  Pt arrived late for session so time was limited. Pt demonstrates high motivation during session today. He is now exercising at the gym so therapy sessions have focused primarily on range of motion. He continues to make gains in strength but his L knee range of motion remains significantly limited particularly in flexion. Pt has been aggressively working on range of motion at home and therapist has also utilized multiple stretching and soft tissue techniques to work on his range of motion. He would benefit from further from skilled PT intervention in order to address mobility, strength, and range of motion deficits in order to return to full function at home.    Personal Factors and Comorbidities  Comorbidity 1;Past/Current Experience    Examination-Activity Limitations  Carry;Caring for Others;Locomotion Level;Stairs;Squat    Examination-Participation Restrictions  Community Activity;Yard Work    Rehab Potential  Good    PT Frequency  3x / week    PT Duration  8 weeks    PT Treatment/Interventions  ADLs/Self Care Home Management;Aquatic Therapy;Biofeedback;Canalith Repostioning;Cryotherapy;Electrical Stimulation;Iontophoresis 74m/ml Dexamethasone;Moist Heat;Traction;Ultrasound;Gait training;DME Instruction;Stair training;Therapeutic exercise;Therapeutic activities;Balance training;Neuromuscular re-education;Patient/family education;Manual techniques;Passive range of motion;Scar mobilization;Dry needling;Vestibular;Joint Manipulations    PT Next Visit Plan  Continue working on knee flexion and extension ROM    PT Home Exercise Plan  Medbridge Access Code: [email or web address], also discussed  performing heel prop in seated position in kitchen with foot propped on chair (nothing underneath knee) x 3 min during meal times    Consulted and Agree with Plan of Care  Patient       Patient will benefit from skilled therapeutic intervention in order to improve the following deficits and impairments:  Abnormal gait, Decreased balance, Decreased strength, Difficulty walking, Decreased range of motion, Pain  Visit Diagnosis: Muscle weakness (generalized)  Chronic pain of left knee     Problem List Patient Active Problem List   Diagnosis Date Noted  . Left shoulder pain 12/13/2017  . Pre-diabetes 01/06/2017  . Snoring 04/02/2016  . Osteoarthritis of left knee 12/22/2014  . Peanut allergy   . Acute meniscal tear of knee   . Glaucoma   . Acute medial meniscal tear   . Right eye injury 11/11/2012   JPhillips GroutPT, DPT, GCS  Alejandro Beltran 12/15/2019, 3:47 PM  [place] [facility] [address] [address]Phone: [phone]  Fax:  [phone] Name: Alejandro ADORNOMRN: [id number] of Birth: [birth year]

## 2019-12-19 ENCOUNTER — Ambulatory Visit

## 2019-12-19 ENCOUNTER — Other Ambulatory Visit: Payer: Self-pay

## 2019-12-19 DIAGNOSIS — G8929 Other chronic pain: Secondary | ICD-10-CM

## 2019-12-19 DIAGNOSIS — M25562 Pain in left knee: Secondary | ICD-10-CM | POA: Diagnosis not present

## 2019-12-19 DIAGNOSIS — M6281 Muscle weakness (generalized): Secondary | ICD-10-CM

## 2019-12-19 NOTE — Therapy (Signed)
[place] MAIN [facility] SERVICES [address] [place], [place], [address] Phone: [phone]   Fax:  [phone]  Physical Therapy Treatment  Patient Details  Name: Alejandro Beltran MRN: [id number] Date of Birth: [birth year] Referring Provider (PT): Dr. Chesley Mires   Encounter Date: 12/19/2019  PT End of Session - 12/19/19 1353    Visit Number  38    Number of Visits  60    Date for PT Re-Evaluation  02/01/20    Authorization Type  Eval: 09/14/19    PT Start Time  1350    PT Stop Time  1430    PT Time Calculation (min)  40 min    Equipment Utilized During Treatment  Gait belt    Activity Tolerance  Patient tolerated treatment well    Behavior During Therapy  [facility] for tasks assessed/performed       Past Medical History:  Diagnosis Date  . Acute medial meniscal tear    left  . Acute meniscal tear of knee   . Allergy   . Glaucoma   . Peanut allergy     Past Surgical History:  Procedure Laterality Date  . EYE SURGERY     rt retina sx  . MENISCUS REPAIR  08/2015    There were no vitals filed for this visit.  Subjective Assessment - 12/19/19 1352    Subjective  Pt reports that he is doing well today. Denies any knee pain upon arrival today. Pt has been consistent with his HEP. He just finished working out over the gym prior to coming for therapy. No specific questions or concerns.    Pertinent History  Pt underwent L TKR 08/15/19. He started OP PT 2 days after surgery at [facility] in [place] however due to the distance of the drive he transferred his care to [facility]. Pt reports that his surgeon would like for him to get therapy 3 days/week and they were only able to schedule him for two days per week so he is transferring his care to [facility]. Pt has been performing his HEP at home which includes static L knee flexion and extension stretches as well as additional LLE strengthening. He continues to utilize his Polar Care as well.  Pt had a meniscal repair of L knee in 2014 as well as a meniscal repair to his R knee in 2016. Prior to surgery he reports pain but fulll L knee range of motion. Other than his surgery pt denies any other recent changes to his health.    Patient Stated Goals  Improve L knee range of motion and strength as well as decrease pain    Currently in Pain?  No/denies          TREATMENT   Manual Therapy Knee flexion step stretch 45s hold x 3; Prone L knee passive extension stretch with5# ankle weight, towel under thigh, while therapist performs STM and trigger point release to L hamstrings x 5 minutes; L patella inferior,superior, medial, and lateralmobilizationsgrade III,20s/bout x3boutseach; L knee femur on tibia AP mobilizations at end range extension grade III, 20s/bout x4bouts; L knee tibia on femur ER mobilizations at end range extension grade III, 20s/boutx4bouts; Hooklying L tibia on femur AP mobilizations at end range flexion, grade III, 20s/bout x 4 bouts; Seated L knee MET stretch 5s contract/5s relax x 3, repeated twice;   Pt educated throughout session about proper posture and technique with exercises. Improved exercise technique, movement at target joints, use of  target muscles after min to mod verbal, visual, tactile cues.   Pt demonstrates high motivation during session today. He is now exercising at the gym so therapy sessions have focused primarily on range of motion. He continues to make gains in strength but his L knee range of motion remains significantly limited particularly in flexion. However his limited appears slightly better today at the start of the session since he has already been over at the gym exercising and stretching. Pt has been aggressively working on range of motion at home and therapist has also utilized multiple stretching and soft tissue techniques to work on his range of motion. He would benefit from further from skilled PT intervention  in order to address mobility, strength, and range of motion deficits in order to return to full function at home.                       PT Short Term Goals - 12/08/19 0834      PT SHORT TERM GOAL #1   Title  Pt will be independent with HEP in order to decrease knee pain and increase strength in order to improve pain-free function.    Time  6    Period  Weeks    Status  Achieved        PT Long Term Goals - 12/08/19 0834      PT LONG TERM GOAL #1   Title  Pt will increase LEFS to >60/80 in order to demonstrate significant improvement in lower extremity function.    Baseline  09/14/19: to be completed at next appointment 12/28: 13/80; 10/10/19: 29/80; 11/28/19: 30/80    Time  8    Period  Weeks    Status  Partially Met    Target Date  02/01/20      PT LONG TERM GOAL #2   Title  Pt will decrease worst pain as reported on NPRS by at least 3 points in order to demonstrate clinically significant reduction in ankle/foot pain.    Baseline  09/14/19: worst: 8/10; 10/10/19: 6/10; 11/28/19: 8/10;    Time  8    Period  Weeks    Status  On-going    Target Date  02/01/20      PT LONG TERM GOAL #3   Title  Pt will increase strength of L hip abduction and adduction by at least 1/2 MMT grade in order to demonstrate improvement in strength and function    Baseline  09/14/19: abduction: 4/5, adduction: 4-/5; 10/10/19: unchanged; 11/03/19: deferred; 11/28/19: abduction: 4/5, adduction: 4/5;    Time  8    Period  Weeks    Status  Partially Met    Target Date  02/01/20      PT LONG TERM GOAL #4   Title  Pt will improve L knee range of motion to within 5 degrees of neutral extension and greater than 115 degrees of flexion in order to normalize gait and stair ascend/descend    Baseline  09/13/29: lacking 14 degrees extension, 77 degrees flexion; 10/10/19: Lacking 6 degrees extension to 78 degrees flexion; 11/03/19: lacking 2 degrees of extension to 83 degrees flexion; 11/28/19: lacking 2  degrees of extension to 83 degrees flexion    Time  8    Period  Weeks    Status  Partially Met    Target Date  02/01/20      PT LONG TERM GOAL #5   Title  Patient will perform the  5x STS without UE support with equal weight acceptance in LE's in <10 seconds to demonstrate functional strength and mobility.    Baseline  12/28: 14 seconds with no uE suppot, RLE significantly used    Time  8    Period  Weeks    Status  Deferred    Target Date  02/01/20            Plan - 12/19/19 1354    Clinical Impression Statement  Pt demonstrates high motivation during session today. He is now exercising at the gym so therapy sessions have focused primarily on range of motion. He continues to make gains in strength but his L knee range of motion remains significantly limited particularly in flexion. However his limited appears slightly better today at the start of the session since he has already been over at the gym exercising and stretching. Pt has been aggressively working on range of motion at home and therapist has also utilized multiple stretching and soft tissue techniques to work on his range of motion. He would benefit from further from skilled PT intervention in order to address mobility, strength, and range of motion deficits in order to return to full function at home.    Personal Factors and Comorbidities  Comorbidity 1;Past/Current Experience    Examination-Activity Limitations  Carry;Caring for Others;Locomotion Level;Stairs;Squat    Examination-Participation Restrictions  Community Activity;Yard Work    Stability/Clinical Decision Making  Stable/Uncomplicated    Clinical Decision Making  Moderate    Rehab Potential  Good    PT Frequency  3x / week    PT Duration  8 weeks    PT Treatment/Interventions  ADLs/Self Care Home Management;Aquatic Therapy;Biofeedback;Canalith Repostioning;Cryotherapy;Electrical Stimulation;Iontophoresis 8m/ml Dexamethasone;Moist Heat;Traction;Ultrasound;Gait  training;DME Instruction;Stair training;Therapeutic exercise;Therapeutic activities;Balance training;Neuromuscular re-education;Patient/family education;Manual techniques;Passive range of motion;Scar mobilization;Dry needling;Vestibular;Joint Manipulations    PT Next Visit Plan  Continue working on knee flexion and extension ROM    PT Home Exercise Plan  Medbridge Access Code: [email or web address], also discussed performing heel prop in seated position in kitchen with foot propped on chair (nothing underneath knee) x 3 min during meal times    Consulted and Agree with Plan of Care  Patient       Patient will benefit from skilled therapeutic intervention in order to improve the following deficits and impairments:  Abnormal gait, Decreased balance, Decreased strength, Difficulty walking, Decreased range of motion, Pain  Visit Diagnosis: Muscle weakness (generalized)  Chronic pain of left knee     Problem List Patient Active Problem List   Diagnosis Date Noted  . Left shoulder pain 12/13/2017  . Pre-diabetes 01/06/2017  . Snoring 04/02/2016  . Osteoarthritis of left knee 12/22/2014  . Peanut allergy   . Acute meniscal tear of knee   . Glaucoma   . Acute medial meniscal tear   . Right eye injury 11/11/2012   JPhillips GroutPT, DPT, GCS  Arneda Sappington 12/19/2019, 2:45 PM  [place] [facility]Ku[facility] CenterSERVICES [address] [address]Phone: [phone]  Fax:  [phone] Name: IRYSON BACHAMRN: [id number] of Birth: 7Jul 28, 1967

## 2019-12-21 ENCOUNTER — Other Ambulatory Visit: Payer: Self-pay

## 2019-12-21 ENCOUNTER — Ambulatory Visit

## 2019-12-21 DIAGNOSIS — G8929 Other chronic pain: Secondary | ICD-10-CM

## 2019-12-21 DIAGNOSIS — M25562 Pain in left knee: Secondary | ICD-10-CM | POA: Diagnosis not present

## 2019-12-21 DIAGNOSIS — M6281 Muscle weakness (generalized): Secondary | ICD-10-CM

## 2019-12-21 NOTE — Therapy (Signed)
[place] MAIN [facility] SERVICES [address] [place], [place], [address] Phone: [phone]   Fax:  [phone]  Physical Therapy Treatment  Patient Details  Name: Alejandro Beltran MRN: [id number] Date of Birth: 1965-11-20 Referring Provider (PT): Dr. Chesley Mires   Encounter Date: 12/21/2019  PT End of Session - 12/21/19 1449    Visit Number  39    Number of Visits  60    Date for PT Re-Evaluation  02/01/20    Authorization Type  Eval: 09/14/19    PT Start Time  1353    PT Stop Time  1435    PT Time Calculation (min)  42 min    Equipment Utilized During Treatment  Gait belt    Activity Tolerance  Patient tolerated treatment well    Behavior During Therapy  [facility] for tasks assessed/performed       Past Medical History:  Diagnosis Date  . Acute medial meniscal tear    left  . Acute meniscal tear of knee   . Allergy   . Glaucoma   . Peanut allergy     Past Surgical History:  Procedure Laterality Date  . EYE SURGERY     rt retina sx  . MENISCUS REPAIR  08/2015    There were no vitals filed for this visit.  Subjective Assessment - 12/21/19 1448    Subjective  Pt reports that he is doing well today. Denies any knee pain upon arrival today. He saw Dr. Mardelle Matte at [facility] this morning. They drew bloodwork to make sure he didn't have any underlying infection. He plans to follow-up in a couple weeks. Pt has been consistent with his HEP. He is headed over the gym after therapy today. No specific questions or concerns.    Pertinent History  Pt underwent L TKR 08/15/19. He started OP PT 2 days after surgery at [facility] in [place] however due to the distance of the drive he transferred his care to [facility]. Pt reports that his surgeon would like for him to get therapy 3 days/week and they were only able to schedule him for two days per week so he is transferring his care to [facility]. Pt has been performing his HEP at home which  includes static L knee flexion and extension stretches as well as additional LLE strengthening. He continues to utilize his Polar Care as well. Pt had a meniscal repair of L knee in 2014 as well as a meniscal repair to his R knee in 2016. Prior to surgery he reports pain but fulll L knee range of motion. Other than his surgery pt denies any other recent changes to his health.    Patient Stated Goals  Improve L knee range of motion and strength as well as decrease pain    Currently in Pain?  No/denies         TREATMENT   Manual Therapy Interim history obtained; Prone L knee passive extension stretch with passive L knee flexion stretch by therapist 30s hold x 4; L patella inferior,superior, medial, and lateralmobilizationsgrade III,20s/bout x3boutseach; L knee femur on tibia AP mobilizations at end range extension grade III, 20s/bout x4bouts; Seated L knee MET stretch 5s contract/5s relax x 3, repeated twice; Extensive education regarding chronic pain, self-management techniques, and etiology.    Pt educated throughout session about proper posture and technique with exercises. Improved exercise technique, movement at target joints, use of target muscles after min to mod verbal, visual, tactile cues.  Pt demonstrates high motivation during session today. He is now exercising at the gym so therapy sessions have focused primarily on range of motion. Pt has been aggressively working on range of motion at home and therapist has also utilized multiple stretching and soft tissue techniques to work on his range of motion. Continued with similar techniques today and also provided extensive chronic pain education including self-management techniques and etiology. Pt issued the first 2 sections of "Explain Pain" to read at home. He will need a progress note at next visit. Pt would benefit from further from skilled PT intervention in order to address mobility, strength, and range of motion  deficits in order to return to full function at home.                          PT Short Term Goals - 12/08/19 0834      PT SHORT TERM GOAL #1   Title  Pt will be independent with HEP in order to decrease knee pain and increase strength in order to improve pain-free function.    Time  6    Period  Weeks    Status  Achieved        PT Long Term Goals - 12/08/19 0834      PT LONG TERM GOAL #1   Title  Pt will increase LEFS to >60/80 in order to demonstrate significant improvement in lower extremity function.    Baseline  09/14/19: to be completed at next appointment 12/28: 13/80; 10/10/19: 29/80; 11/28/19: 30/80    Time  8    Period  Weeks    Status  Partially Met    Target Date  02/01/20      PT LONG TERM GOAL #2   Title  Pt will decrease worst pain as reported on NPRS by at least 3 points in order to demonstrate clinically significant reduction in ankle/foot pain.    Baseline  09/14/19: worst: 8/10; 10/10/19: 6/10; 11/28/19: 8/10;    Time  8    Period  Weeks    Status  On-going    Target Date  02/01/20      PT LONG TERM GOAL #3   Title  Pt will increase strength of L hip abduction and adduction by at least 1/2 MMT grade in order to demonstrate improvement in strength and function    Baseline  09/14/19: abduction: 4/5, adduction: 4-/5; 10/10/19: unchanged; 11/03/19: deferred; 11/28/19: abduction: 4/5, adduction: 4/5;    Time  8    Period  Weeks    Status  Partially Met    Target Date  02/01/20      PT LONG TERM GOAL #4   Title  Pt will improve L knee range of motion to within 5 degrees of neutral extension and greater than 115 degrees of flexion in order to normalize gait and stair ascend/descend    Baseline  09/13/29: lacking 14 degrees extension, 77 degrees flexion; 10/10/19: Lacking 6 degrees extension to 78 degrees flexion; 11/03/19: lacking 2 degrees of extension to 83 degrees flexion; 11/28/19: lacking 2 degrees of extension to 83 degrees flexion    Time  8     Period  Weeks    Status  Partially Met    Target Date  02/01/20      PT LONG TERM GOAL #5   Title  Patient will perform the 5x STS without UE support with equal weight acceptance in LE's in <10 seconds to  demonstrate functional strength and mobility.    Baseline  12/28: 14 seconds with no uE suppot, RLE significantly used    Time  8    Period  Weeks    Status  Deferred    Target Date  02/01/20            Plan - 12/21/19 1450    Clinical Impression Statement  Pt demonstrates high motivation during session today. He is now exercising at the gym so therapy sessions have focused primarily on range of motion. Pt has been aggressively working on range of motion at home and therapist has also utilized multiple stretching and soft tissue techniques to work on his range of motion. Continued with similar techniques today and also provided extensive chronic pain education including self-management techniques and etiology. Pt issued the first 2 sections of "Explain Pain" to read at home. He will need a progress note at next visit. Pt would benefit from further from skilled PT intervention in order to address mobility, strength, and range of motion deficits in order to return to full function at home.    Personal Factors and Comorbidities  Comorbidity 1;Past/Current Experience    Examination-Activity Limitations  Carry;Caring for Others;Locomotion Level;Stairs;Squat    Examination-Participation Restrictions  Community Activity;Yard Work    Stability/Clinical Decision Making  Stable/Uncomplicated    Rehab Potential  Good    PT Frequency  3x / week    PT Duration  8 weeks    PT Treatment/Interventions  ADLs/Self Care Home Management;Aquatic Therapy;Biofeedback;Canalith Repostioning;Cryotherapy;Electrical Stimulation;Iontophoresis 22m/ml Dexamethasone;Moist Heat;Traction;Ultrasound;Gait training;DME Instruction;Stair training;Therapeutic exercise;Therapeutic activities;Balance training;Neuromuscular  re-education;Patient/family education;Manual techniques;Passive range of motion;Scar mobilization;Dry needling;Vestibular;Joint Manipulations    PT Next Visit Plan  Continue working on knee flexion and extension ROM    PT Home Exercise Plan  Medbridge Access Code: [email or web address], also discussed performing heel prop in seated position in kitchen with foot propped on chair (nothing underneath knee) x 3 min during meal times    Consulted and Agree with Plan of Care  Patient       Patient will benefit from skilled therapeutic intervention in order to improve the following deficits and impairments:  Abnormal gait, Decreased balance, Decreased strength, Difficulty walking, Decreased range of motion, Pain  Visit Diagnosis: Muscle weakness (generalized)  Chronic pain of left knee     Problem List Patient Active Problem List   Diagnosis Date Noted  . Left shoulder pain 12/13/2017  . Pre-diabetes 01/06/2017  . Snoring 04/02/2016  . Osteoarthritis of left knee 12/22/2014  . Peanut allergy   . Acute meniscal tear of knee   . Glaucoma   . Acute medial meniscal tear   . Right eye injury 11/11/2012   Alejandro GroutPT, DPT, GCS  Alejandro Beltran 12/21/2019, 2:54 PM  [place] [facility] [address] [address]Phone: [phone]  Fax:  [phone] Name: Alejandro OBRYANTMRN: [id number] of Birth: 7June 26, 1967

## 2019-12-22 ENCOUNTER — Ambulatory Visit

## 2019-12-26 ENCOUNTER — Ambulatory Visit: Attending: Nurse Practitioner

## 2019-12-26 DIAGNOSIS — M25562 Pain in left knee: Secondary | ICD-10-CM | POA: Insufficient documentation

## 2019-12-26 DIAGNOSIS — M6281 Muscle weakness (generalized): Secondary | ICD-10-CM | POA: Insufficient documentation

## 2019-12-26 DIAGNOSIS — R262 Difficulty in walking, not elsewhere classified: Secondary | ICD-10-CM | POA: Insufficient documentation

## 2019-12-26 DIAGNOSIS — G8929 Other chronic pain: Secondary | ICD-10-CM | POA: Insufficient documentation

## 2019-12-27 ENCOUNTER — Ambulatory Visit

## 2019-12-27 ENCOUNTER — Other Ambulatory Visit: Payer: Self-pay

## 2019-12-27 DIAGNOSIS — G8929 Other chronic pain: Secondary | ICD-10-CM

## 2019-12-27 DIAGNOSIS — R262 Difficulty in walking, not elsewhere classified: Secondary | ICD-10-CM | POA: Diagnosis present

## 2019-12-27 DIAGNOSIS — M25562 Pain in left knee: Secondary | ICD-10-CM | POA: Diagnosis present

## 2019-12-27 DIAGNOSIS — M6281 Muscle weakness (generalized): Secondary | ICD-10-CM

## 2019-12-27 NOTE — Therapy (Signed)
[place] MAIN [facility] SERVICES [address] [place], [place], [address] Phone: [phone]   Fax:  [phone]  Physical Therapy Progress Note   Dates of reporting period  11/28/19  to   12/27/19  Patient Details  Name: Alejandro Beltran MRN: [id number] Date of Birth: 06-Dec-1965 Referring Provider (PT): Dr. Chesley Mires   Encounter Date: 12/27/2019  PT End of Session - 12/28/19 1257    Visit Number  40    Number of Visits  60    Date for PT Re-Evaluation  02/01/20    Authorization Type  Eval: 09/14/19    PT Start Time  1357    PT Stop Time  1430    PT Time Calculation (min)  33 min    Equipment Utilized During Treatment  Gait belt    Activity Tolerance  Patient tolerated treatment well    Behavior During Therapy  [facility] for tasks assessed/performed       Past Medical History:  Diagnosis Date  . Acute medial meniscal tear    left  . Acute meniscal tear of knee   . Allergy   . Glaucoma   . Peanut allergy     Past Surgical History:  Procedure Laterality Date  . EYE SURGERY     rt retina sx  . MENISCUS REPAIR  08/2015    There were no vitals filed for this visit.  Subjective Assessment - 12/27/19 1415    Subjective  Pt reports that he is doing well today. Denies any knee pain upon arrival at rest today but reports continued pain with activity. He states that his wife has stated that she believes his range of motion is improving slightly. Pt has been consistent with his HEP. He is headed over the gym after therapy today. No specific questions or concerns.    Pertinent History  Pt underwent L TKR 08/15/19. He started OP PT 2 days after surgery at [facility] in [place] however due to the distance of the drive he transferred his care to [facility]. Pt reports that his surgeon would like for him to get therapy 3 days/week and they were only able to schedule him for two days per week so he is transferring his care to [facility]. Pt has been  performing his HEP at home which includes static L knee flexion and extension stretches as well as additional LLE strengthening. He continues to utilize his Polar Care as well. Pt had a meniscal repair of L knee in 2014 as well as a meniscal repair to his R knee in 2016. Prior to surgery he reports pain but fulll L knee range of motion. Other than his surgery pt denies any other recent changes to his health.    Patient Stated Goals  Improve L knee range of motion and strength as well as decrease pain    Currently in Pain?  No/denies              TREATMENT   Manual Therapy Interim history obtained; Prone L knee passive extension stretch with10# ankle weight, towel under thigh, while therapist performs STM and trigger point release to L hamstrings x 5 minutes; Supine L quad set into extension with overpressure by therapist 10s hold, repeated 3 times; L patella inferior,superior, medial, and lateralmobilizationsgrade III,20s/bout x3boutseach; Hooklying L tibia on femur AP mobilizations at end range flexion, grade III, 20s/bout x 4 bouts; Seated L knee MET stretch 5s contract/5s relax x 3, repeated twice; L knee  AAROM (with overpressure): lacking 4 degrees of extension at rest, 0 degrees with overpressure to 95 degrees flexion with overpressure  Pt educated throughout session about proper posture and technique with exercises. Improved exercise technique, movement at target joints, use of target muscles after min to mod verbal, visual, tactile cues.   Pt demonstrates high motivation during session today. He arrived late so session is somewhat limited today.  He is now exercising at the gym so therapy sessions have focused primarily on range of motion.Pt has been aggressively working on range of motion at home and therapist has also utilized multiple stretching and soft tissue techniques to work on his range of motion. Continued with similar techniques today and also provided  extensive chronic pain education including self-management techniques and etiology. Updated goals with patient today. His L knee AAROM is improving slowly and he is now lacking 4 degrees of extension at rest but gets to 0 degrees with overpressure. He is able to achieve 95 degrees of flexion with overpressure. Pt would benefit from further from skilled PT intervention in order to address mobility, strength, and range of motion deficits in order to return to full function at home.                     PT Short Term Goals - 12/28/19 1258      PT SHORT TERM GOAL #1   Title  Pt will be independent with HEP in order to decrease knee pain and increase strength in order to improve pain-free function.    Time  6    Period  Weeks    Status  Achieved        PT Long Term Goals - 12/28/19 1258      PT LONG TERM GOAL #1   Title  Pt will increase LEFS to >60/80 in order to demonstrate significant improvement in lower extremity function.    Baseline  09/14/19: to be completed at next appointment 12/28: 13/80; 10/10/19: 29/80; 11/28/19: 30/80    Time  8    Period  Weeks    Status  Partially Met    Target Date  02/01/20      PT LONG TERM GOAL #2   Title  Pt will decrease worst pain as reported on NPRS by at least 3 points in order to demonstrate clinically significant reduction in ankle/foot pain.    Baseline  09/14/19: worst: 8/10; 10/10/19: 6/10; 11/28/19: 8/10;    Time  8    Period  Weeks    Status  On-going    Target Date  02/01/20      PT LONG TERM GOAL #3   Title  Pt will increase strength of L hip abduction and adduction by at least 1/2 MMT grade in order to demonstrate improvement in strength and function    Baseline  09/14/19: abduction: 4/5, adduction: 4-/5; 10/10/19: unchanged; 11/03/19: deferred; 11/28/19: abduction: 4/5, adduction: 4/5;    Time  8    Period  Weeks    Status  Partially Met    Target Date  02/01/20      PT LONG TERM GOAL #4   Title  Pt will improve L knee  range of motion to within 5 degrees of neutral extension and greater than 115 degrees of flexion in order to normalize gait and stair ascend/descend    Baseline  09/13/29: lacking 14 degrees extension, 77 degrees flexion; 10/10/19: Lacking 6 degrees extension to 78 degrees flexion; 11/03/19: lacking 2  degrees of extension to 83 degrees flexion; 11/28/19: lacking 2 degrees of extension to 83 degrees flexion; 12/28/19: 0-95 degrees AAROM with overpressure    Time  8    Period  Weeks    Status  Partially Met    Target Date  02/01/20      PT LONG TERM GOAL #5   Title  Patient will perform the 5x STS without UE support with equal weight acceptance in LE's in <10 seconds to demonstrate functional strength and mobility.    Baseline  12/28: 14 seconds with no uE suppot, RLE significantly used    Time  8    Period  Weeks    Status  Deferred    Target Date  02/01/20            Plan - 12/28/19 1257    Clinical Impression Statement  Pt demonstrates high motivation during session today. He arrived late so session is somewhat limited today.  He is now exercising at the gym so therapy sessions have focused primarily on range of motion. Pt has been aggressively working on range of motion at home and therapist has also utilized multiple stretching and soft tissue techniques to work on his range of motion. Continued with similar techniques today and also provided extensive chronic pain education including self-management techniques and etiology. Updated goals with patient today. His L knee AAROM is improving slowly and he is now lacking 4 degrees of extension at rest but gets to 0 degrees with overpressure. He is able to achieve 95 degrees of flexion with overpressure. Pt would benefit from further from skilled PT intervention in order to address mobility, strength, and range of motion deficits in order to return to full function at home.    Personal Factors and Comorbidities  Comorbidity 1;Past/Current Experience     Examination-Activity Limitations  Carry;Caring for Others;Locomotion Level;Stairs;Squat    Examination-Participation Restrictions  Community Activity;Yard Work    Stability/Clinical Decision Making  Stable/Uncomplicated    Rehab Potential  Good    PT Frequency  3x / week    PT Duration  8 weeks    PT Treatment/Interventions  ADLs/Self Care Home Management;Aquatic Therapy;Biofeedback;Canalith Repostioning;Cryotherapy;Electrical Stimulation;Iontophoresis 14m/ml Dexamethasone;Moist Heat;Traction;Ultrasound;Gait training;DME Instruction;Stair training;Therapeutic exercise;Therapeutic activities;Balance training;Neuromuscular re-education;Patient/family education;Manual techniques;Passive range of motion;Scar mobilization;Dry needling;Vestibular;Joint Manipulations    PT Next Visit Plan  Continue working on knee flexion and extension ROM    PT Home Exercise Plan  Medbridge Access Code: [email or web address], also discussed performing heel prop in seated position in kitchen with foot propped on chair (nothing underneath knee) x 3 min during meal times    Consulted and Agree with Plan of Care  Patient       Patient will benefit from skilled therapeutic intervention in order to improve the following deficits and impairments:  Abnormal gait, Decreased balance, Decreased strength, Difficulty walking, Decreased range of motion, Pain  Visit Diagnosis: Muscle weakness (generalized)  Chronic pain of left knee     Problem List Patient Active Problem List   Diagnosis Date Noted  . Left shoulder pain 12/13/2017  . Pre-diabetes 01/06/2017  . Snoring 04/02/2016  . Osteoarthritis of left knee 12/22/2014  . Peanut allergy   . Acute meniscal tear of knee   . Glaucoma   . Acute medial meniscal tear   . Right eye injury 11/11/2012   JLyndel SafeHuprich PT, DPT, GCS  Javion Holmer 12/28/2019, 2:06 PM  [facility]aleMAIN [facility]ERV[facility] [address]  [place] Phone: [phone]   Fax:  [phone]  Name: Alejandro Beltran MRN: [id number] Date of Birth: March 19, 1966

## 2019-12-28 ENCOUNTER — Other Ambulatory Visit: Payer: Self-pay

## 2019-12-28 ENCOUNTER — Ambulatory Visit

## 2019-12-28 DIAGNOSIS — M6281 Muscle weakness (generalized): Secondary | ICD-10-CM

## 2019-12-28 DIAGNOSIS — M25562 Pain in left knee: Secondary | ICD-10-CM

## 2019-12-28 DIAGNOSIS — G8929 Other chronic pain: Secondary | ICD-10-CM

## 2019-12-28 NOTE — Therapy (Signed)
[place] MAIN [facility] SERVICES [address] [place], [place], [address] Phone: [phone]   Fax:  [phone]  Physical Therapy Treatment  Patient Details  Name: Alejandro Beltran MRN: [id number] Date of Birth: May 16, 1966 Referring Provider (PT): Dr. Chesley Mires   Encounter Date: 12/28/2019  PT End of Session - 12/28/19 1407    Visit Number  41    Number of Visits  60    Date for PT Re-Evaluation  02/01/20    Authorization Type  Eval: 09/14/19    PT Start Time  1400    PT Stop Time  1430    PT Time Calculation (min)  30 min    Equipment Utilized During Treatment  Gait belt    Activity Tolerance  Patient tolerated treatment well    Behavior During Therapy  [facility] for tasks assessed/performed       Past Medical History:  Diagnosis Date  . Acute medial meniscal tear    left  . Acute meniscal tear of knee   . Allergy   . Glaucoma   . Peanut allergy     Past Surgical History:  Procedure Laterality Date  . EYE SURGERY     rt retina sx  . MENISCUS REPAIR  08/2015    There were no vitals filed for this visit.  Subjective Assessment - 12/28/19 1406    Subjective  Pt reports that he is doing well today. Denies any knee pain upon arrival at rest today but reports continued pain with activity. Pt has been consistent with his HEP. He is headed over the gym after therapy today. No specific questions or concerns.    Pertinent History  Pt underwent L TKR 08/15/19. He started OP PT 2 days after surgery at [facility] in [place] however due to the distance of the drive he transferred his care to [facility]. Pt reports that his surgeon would like for him to get therapy 3 days/week and they were only able to schedule him for two days per week so he is transferring his care to [facility]. Pt has been performing his HEP at home which includes static L knee flexion and extension stretches as well as additional LLE strengthening. He continues to utilize  his [employer] as well. Pt had a meniscal repair of L knee in 2014 as well as a meniscal repair to his R knee in 2016. Prior to surgery he reports pain but fulll L knee range of motion. Other than his surgery pt denies any other recent changes to his health.    Patient Stated Goals  Improve L knee range of motion and strength as well as decrease pain    Currently in Pain?  No/denies          TREATMENT   Manual Therapy Interim history obtained; Pt completed LEFS 27/80; Worst pain: 8/10; L hip MMT: abduction: 4+/5, adduction: 4+/5; Prone L knee passive extension stretch with10# ankle weight, towel under thigh; Supine L quad set into extension with overpressure by therapist 10s hold, repeated 3 times; L patella inferior,superior, medial, and lateralmobilizationsgrade III,20s/bout x3boutseach; Hooklying L femur on tibia AP mobilizations at end range extension, grade III, 20s/bout x 2 bouts; Hooklying L tibia ER mobilizations at end range extension, grade III, 20s/bout x 2 bouts; Hooklying L tibia on femur AP mobilizations at end range flexion, grade III, 20s/bout x 3. bouts; Seated L knee MET stretch 5s contract/5s relax x 3, repeated twice;   Pt educated throughout  session about proper posture and technique with exercises. Improved exercise technique, movement at target joints, use of target muscles after min to mod verbal, visual, tactile cues.   Pt demonstrates high motivation during session today. He arrived late so session is somewhat limited today.  He is now exercising at the gym so therapy sessions have focused primarily on range of motion.Pt has been aggressively working on range of motion at home and therapist has also utilized multiple stretching and soft tissue techniques to work on his range of motion.Continued with similar techniques today as pt is demonstrating slow but steady progress in range of motion. However his LEFS remains essentially unchanged at 27/80  and his worst pain still increases to an 8/10 even though the frequency and duration has improved. Pt encouraged to continue his HEP. Ptwould benefit from further from skilled PT intervention in order to address mobility, strength, and range of motion deficits in order to return to full function at home.                       PT Short Term Goals - 12/28/19 2041      PT SHORT TERM GOAL #1   Title  Pt will be independent with HEP in order to decrease knee pain and increase strength in order to improve pain-free function.    Time  6    Period  Weeks    Status  Achieved        PT Long Term Goals - 12/28/19 2041      PT LONG TERM GOAL #1   Title  Pt will increase LEFS to >60/80 in order to demonstrate significant improvement in lower extremity function.    Baseline  09/14/19: to be completed at next appointment 12/28: 13/80; 10/10/19: 29/80; 11/28/19: 30/80; 12/28/19: 27/80    Time  8    Period  Weeks    Status  Partially Met    Target Date  02/01/20      PT LONG TERM GOAL #2   Title  Pt will decrease worst pain as reported on NPRS by at least 3 points in order to demonstrate clinically significant reduction in ankle/foot pain.    Baseline  09/14/19: worst: 8/10; 10/10/19: 6/10; 11/28/19: 8/10; 12/28/19: 8/10;    Time  8    Period  Weeks    Status  On-going    Target Date  02/01/20      PT LONG TERM GOAL #3   Title  Pt will increase strength of L hip abduction and adduction by at least 1/2 MMT grade in order to demonstrate improvement in strength and function    Baseline  09/14/19: abduction: 4/5, adduction: 4-/5; 10/10/19: unchanged; 11/03/19: deferred; 11/28/19: abduction: 4/5, adduction: 4/5; 12/28/19: abduction: 4+/5, adduction: 4+/5;    Time  8    Period  Weeks    Status  Achieved      PT LONG TERM GOAL #4   Title  Pt will improve L knee range of motion to within 5 degrees of neutral extension and greater than 115 degrees of flexion in order to normalize gait and stair  ascend/descend    Baseline  09/13/29: lacking 14 degrees extension, 77 degrees flexion; 10/10/19: Lacking 6 degrees extension to 78 degrees flexion; 11/03/19: lacking 2 degrees of extension to 83 degrees flexion; 11/28/19: lacking 2 degrees of extension to 83 degrees flexion; 12/28/19: 0-95 degrees AAROM with overpressure    Time  8    Period  Weeks    Status  Partially Met    Target Date  02/01/20      PT LONG TERM GOAL #5   Title  Patient will perform the 5x STS without UE support with equal weight acceptance in LE's in <10 seconds to demonstrate functional strength and mobility.    Baseline  12/28: 14 seconds with no uE suppot, RLE significantly used    Time  8    Period  Weeks    Status  Deferred    Target Date  02/01/20            Plan - 12/28/19 1409    Clinical Impression Statement  Pt demonstrates high motivation during session today. He arrived late so session is somewhat limited today.  He is now exercising at the gym so therapy sessions have focused primarily on range of motion. Pt has been aggressively working on range of motion at home and therapist has also utilized multiple stretching and soft tissue techniques to work on his range of motion. Continued with similar techniques today as pt is demonstrating slow but steady progress in range of motion. However his LEFS remains essentially unchanged at 27/80 and his worst pain still increases to an 8/10 even though the frequency and duration has improved. Pt encouraged to continue his HEP. Pt would benefit from further from skilled PT intervention in order to address mobility, strength, and range of motion deficits in order to return to full function at home.    Personal Factors and Comorbidities  Comorbidity 1;Past/Current Experience    Examination-Activity Limitations  Carry;Caring for Others;Locomotion Level;Stairs;Squat    Examination-Participation Restrictions  Community Activity;Yard Work    Stability/Clinical Decision Making   Stable/Uncomplicated    Rehab Potential  Good    PT Frequency  3x / week    PT Duration  8 weeks    PT Treatment/Interventions  ADLs/Self Care Home Management;Aquatic Therapy;Biofeedback;Canalith Repostioning;Cryotherapy;Electrical Stimulation;Iontophoresis 80m/ml Dexamethasone;Moist Heat;Traction;Ultrasound;Gait training;DME Instruction;Stair training;Therapeutic exercise;Therapeutic activities;Balance training;Neuromuscular re-education;Patient/family education;Manual techniques;Passive range of motion;Scar mobilization;Dry needling;Vestibular;Joint Manipulations    PT Next Visit Plan  Continue working on knee flexion and extension ROM    PT Home Exercise Plan  Medbridge Access Code: [email or web address], also discussed performing heel prop in seated position in kitchen with foot propped on chair (nothing underneath knee) x 3 min during meal times    Consulted and Agree with Plan of Care  Patient       Patient will benefit from skilled therapeutic intervention in order to improve the following deficits and impairments:  Abnormal gait, Decreased balance, Decreased strength, Difficulty walking, Decreased range of motion, Pain  Visit Diagnosis: Muscle weakness (generalized)  Chronic pain of left knee     Problem List Patient Active Problem List   Diagnosis Date Noted  . Left shoulder pain 12/13/2017  . Pre-diabetes 01/06/2017  . Snoring 04/02/2016  . Osteoarthritis of left knee 12/22/2014  . Peanut allergy   . Acute meniscal tear of knee   . Glaucoma   . Acute medial meniscal tear   . Right eye injury 11/11/2012   JPhillips GroutPT, DPT, GCS  Alejandro Beltran 12/28/2019, 8:48 PM  [place] [address] [address]Phone: [phone]  Fax:  [phone] Name: Alejandro COMUNALEMRN: [id number] of Birth: [birth year]

## 2020-01-02 ENCOUNTER — Other Ambulatory Visit: Payer: Self-pay

## 2020-01-02 ENCOUNTER — Ambulatory Visit

## 2020-01-02 DIAGNOSIS — M6281 Muscle weakness (generalized): Secondary | ICD-10-CM

## 2020-01-02 DIAGNOSIS — G8929 Other chronic pain: Secondary | ICD-10-CM

## 2020-01-02 NOTE — Therapy (Signed)
[place] MAIN [facility] SERVICES [address] [place], [place], [address] Phone: [phone]   Fax:  [phone]  Physical Therapy Treatment  Patient Details  Name: Alejandro Beltran MRN: [id number] Date of Birth: 08-Feb-1966 Referring Provider (PT): Dr. Chesley Mires   Encounter Date: 01/02/2020  PT End of Session - 01/02/20 1422    Visit Number  42    Number of Visits  60    Date for PT Re-Evaluation  02/01/20    Authorization Type  Eval: 09/14/19    PT Start Time  1430    PT Stop Time  1515    PT Time Calculation (min)  45 min    Equipment Utilized During Treatment  Gait belt    Activity Tolerance  Patient tolerated treatment well    Behavior During Therapy  [facility] for tasks assessed/performed       Past Medical History:  Diagnosis Date  . Acute medial meniscal tear    left  . Acute meniscal tear of knee   . Allergy   . Glaucoma   . Peanut allergy     Past Surgical History:  Procedure Laterality Date  . EYE SURGERY     rt retina sx  . MENISCUS REPAIR  08/2015    There were no vitals filed for this visit.  Subjective Assessment - 01/02/20 1411    Subjective  Pt reports that he is doing well today. Denies any knee pain upon arrival at rest today but reports continued pain with activity. Pt has been consistent with his HEP. He is headed over the gym after therapy today. No specific questions or concerns.    Pertinent History  Pt underwent L TKR 08/15/19. He started OP PT 2 days after surgery at [facility] in [place] however due to the distance of the drive he transferred his care to [facility]. Pt reports that his surgeon would like for him to get therapy 3 days/week and they were only able to schedule him for two days per week so he is transferring his care to [facility]. Pt has been performing his HEP at home which includes static L knee flexion and extension stretches as well as additional LLE strengthening. He continues to utilize  his [employer] as well. Pt had a meniscal repair of L knee in 2014 as well as a meniscal repair to his R knee in 2016. Prior to surgery he reports pain but fulll L knee range of motion. Other than his surgery pt denies any other recent changes to his health.    Patient Stated Goals  Improve L knee range of motion and strength as well as decrease pain    Currently in Pain?  No/denies          TREATMENT   Manual Therapy Interim history obtained; Prone L knee passive extension stretch with10# ankle weight and STM to hamstrings to improve extensibility, towel under thigh x 5 minutes; Prone L knee tibia on femur PA mobilizations with knee bent as close to 90 as possible, grade III, 20s/bout x 3 bouts; Supine L quad set into extension with overpressure by therapist 10s hold, repeated 3 times; L patella inferior,superior, medial, and lateralmobilizationsgrade III,20s/bout x3boutseach; Hooklying L femur on tibia AP mobilizations at end range extension, grade III, 20s/bout x 3 bouts; Hooklying L tibia on femur AP mobilizations at end range flexion, grade III, 20s/bout x 5. bouts; Seated L knee MET stretch 5s contract/5s relax x 3, repeated twice; L  knee AAROM: extension: resting -3 degrees but 0 with overpressure, flexion: 97 degrees with overpressure;   Pt educated throughout session about proper posture and technique with exercises. Improved exercise technique, movement at target joints, use of target muscles after min to mod verbal, visual, tactile cues.   Pt demonstrates high motivation during session today.He is now exercising at the gym so therapy sessions have focused primarily on range of motion.Pt has been aggressively working on range of motion at home and therapist has also utilized multiple stretching and soft tissue techniques to work on his range of motion.Continued with similar techniques today as pt is demonstrating slow but steady progress in range of motion. He  worked out before the session today and planned to return the gym afterward. His L knee AAROM with overpressure is improving to 0-97 degrees. Pt encouraged to continue his HEP. Ptwould benefit from further from skilled PT intervention in order to address mobility, strength, and range of motion deficits in order to return to full function at home.                        PT Short Term Goals - 12/28/19 2041      PT SHORT TERM GOAL #1   Title  Pt will be independent with HEP in order to decrease knee pain and increase strength in order to improve pain-free function.    Time  6    Period  Weeks    Status  Achieved        PT Long Term Goals - 12/28/19 2041      PT LONG TERM GOAL #1   Title  Pt will increase LEFS to >60/80 in order to demonstrate significant improvement in lower extremity function.    Baseline  09/14/19: to be completed at next appointment 12/28: 13/80; 10/10/19: 29/80; 11/28/19: 30/80; 12/28/19: 27/80    Time  8    Period  Weeks    Status  Partially Met    Target Date  02/01/20      PT LONG TERM GOAL #2   Title  Pt will decrease worst pain as reported on NPRS by at least 3 points in order to demonstrate clinically significant reduction in ankle/foot pain.    Baseline  09/14/19: worst: 8/10; 10/10/19: 6/10; 11/28/19: 8/10; 12/28/19: 8/10;    Time  8    Period  Weeks    Status  On-going    Target Date  02/01/20      PT LONG TERM GOAL #3   Title  Pt will increase strength of L hip abduction and adduction by at least 1/2 MMT grade in order to demonstrate improvement in strength and function    Baseline  09/14/19: abduction: 4/5, adduction: 4-/5; 10/10/19: unchanged; 11/03/19: deferred; 11/28/19: abduction: 4/5, adduction: 4/5; 12/28/19: abduction: 4+/5, adduction: 4+/5;    Time  8    Period  Weeks    Status  Achieved      PT LONG TERM GOAL #4   Title  Pt will improve L knee range of motion to within 5 degrees of neutral extension and greater than 115 degrees of  flexion in order to normalize gait and stair ascend/descend    Baseline  09/13/29: lacking 14 degrees extension, 77 degrees flexion; 10/10/19: Lacking 6 degrees extension to 78 degrees flexion; 11/03/19: lacking 2 degrees of extension to 83 degrees flexion; 11/28/19: lacking 2 degrees of extension to 83 degrees flexion; 12/28/19: 0-95 degrees AAROM with overpressure  Time  8    Period  Weeks    Status  Partially Met    Target Date  02/01/20      PT LONG TERM GOAL #5   Title  Patient will perform the 5x STS without UE support with equal weight acceptance in LE's in <10 seconds to demonstrate functional strength and mobility.    Baseline  12/28: 14 seconds with no uE suppot, RLE significantly used    Time  8    Period  Weeks    Status  Deferred    Target Date  02/01/20            Plan - 01/02/20 1423    Clinical Impression Statement  Pt demonstrates high motivation during session today. He is now exercising at the gym so therapy sessions have focused primarily on range of motion. Pt has been aggressively working on range of motion at home and therapist has also utilized multiple stretching and soft tissue techniques to work on his range of motion. Continued with similar techniques today as pt is demonstrating slow but steady progress in range of motion. He worked out before the session today and planned to return the gym afterward. His L knee AAROM with overpressure is improving to 0-97 degrees. Pt encouraged to continue his HEP. Pt would benefit from further from skilled PT intervention in order to address mobility, strength, and range of motion deficits in order to return to full function at home.    Personal Factors and Comorbidities  Comorbidity 1;Past/Current Experience    Examination-Activity Limitations  Carry;Caring for Others;Locomotion Level;Stairs;Squat    Examination-Participation Restrictions  Community Activity;Yard Work    Stability/Clinical Decision Making  Stable/Uncomplicated     Rehab Potential  Good    PT Frequency  3x / week    PT Duration  8 weeks    PT Treatment/Interventions  ADLs/Self Care Home Management;Aquatic Therapy;Biofeedback;Canalith Repostioning;Cryotherapy;Electrical Stimulation;Iontophoresis 53m/ml Dexamethasone;Moist Heat;Traction;Ultrasound;Gait training;DME Instruction;Stair training;Therapeutic exercise;Therapeutic activities;Balance training;Neuromuscular re-education;Patient/family education;Manual techniques;Passive range of motion;Scar mobilization;Dry needling;Vestibular;Joint Manipulations    PT Next Visit Plan  Continue working on knee flexion and extension ROM    PT Home Exercise Plan  Medbridge Access Code: [email or web address], also discussed performing heel prop in seated position in kitchen with foot propped on chair (nothing underneath knee) x 3 min during meal times    Consulted and Agree with Plan of Care  Patient       Patient will benefit from skilled therapeutic intervention in order to improve the following deficits and impairments:  Abnormal gait, Decreased balance, Decreased strength, Difficulty walking, Decreased range of motion, Pain  Visit Diagnosis: Muscle weakness (generalized)  Chronic pain of left knee     Problem List Patient Active Problem List   Diagnosis Date Noted  . Left shoulder pain 12/13/2017  . Pre-diabetes 01/06/2017  . Snoring 04/02/2016  . Osteoarthritis of left knee 12/22/2014  . Peanut allergy   . Acute meniscal tear of knee   . Glaucoma   . Acute medial meniscal tear   . Right eye injury 11/11/2012   JPhillips GroutPT, DPT, GCS  Addie Alonge 01/02/2020, 4:14 PM  [place]MAIN [facility]ERVICES [address] [address]Phone: [phone]  Fax:  [phone] Name: Alejandro GITTLEMANMRN: [id number] of Birth: [birth year]

## 2020-01-04 ENCOUNTER — Ambulatory Visit

## 2020-01-04 ENCOUNTER — Other Ambulatory Visit: Payer: Self-pay

## 2020-01-04 DIAGNOSIS — M6281 Muscle weakness (generalized): Secondary | ICD-10-CM | POA: Diagnosis not present

## 2020-01-04 NOTE — Therapy (Signed)
[place] MAIN [facility] SERVICES [address] [place], [place], [address] Phone: [phone]   Fax:  [phone]  Physical Therapy Treatment  Patient Details  Name: Alejandro Beltran MRN: [id number] Date of Birth: Aug 10, 1966 Referring Provider (PT): Dr. Chesley Mires   Encounter Date: 01/04/2020  PT End of Session - 01/04/20 1355    Visit Number  43    Number of Visits  60    Date for PT Re-Evaluation  02/01/20    Authorization Type  Eval: 09/14/19    PT Start Time  1031    PT Stop Time  1430    PT Time Calculation (min)  32 min    Equipment Utilized During Treatment  Gait belt    Activity Tolerance  Patient tolerated treatment well    Behavior During Therapy  [facility] for tasks assessed/performed       Past Medical History:  Diagnosis Date  . Acute medial meniscal tear    left  . Acute meniscal tear of knee   . Allergy   . Glaucoma   . Peanut allergy     Past Surgical History:  Procedure Laterality Date  . EYE SURGERY     rt retina sx  . MENISCUS REPAIR  08/2015    There were no vitals filed for this visit.  Subjective Assessment - 01/04/20 1354    Subjective  Pt reports that he is doing well today. Denies any knee pain upon arrival at rest today but reports continued pain with activity. Pt has been consistent with his HEP. He is headed over the gym after therapy today. No specific questions or concerns.    Pertinent History  Pt underwent L TKR 08/15/19. He started OP PT 2 days after surgery at [facility] in [place] however due to the distance of the drive he transferred his care to [facility]. Pt reports that his surgeon would like for him to get therapy 3 days/week and they were only able to schedule him for two days per week so he is transferring his care to [facility]. Pt has been performing his HEP at home which includes static L knee flexion and extension stretches as well as additional LLE strengthening. He continues to utilize  his [employer] as well. Pt had a meniscal repair of L knee in 2014 as well as a meniscal repair to his R knee in 2016. Prior to surgery he reports pain but fulll L knee range of motion. Other than his surgery pt denies any other recent changes to his health.    Patient Stated Goals  Improve L knee range of motion and strength as well as decrease pain    Currently in Pain?  No/denies          TREATMENT   Manual Therapy Interim history obtained; Prone L knee passive extension stretch with10# ankle weight and STM to hamstrings to improve extensibility, towel under thigh x 5 minutes; Supine L quad set into extension with overpressure by therapist 10s hold, repeated 3 times; L patella inferior,superior, medial, and lateralmobilizationsgrade III,20s/bout x3boutseach; Hooklying L femur on tibia AP mobilizations at end range extension, grade III, 20s/bout x 3 bouts; Hooklying L tibia on femur AP mobilizations at end range flexion, grade III, 20s/bout x5.bouts; Seated L knee MET stretch 5s contract/5s relax x 3, repeated twice;;   Pt educated throughout session about proper posture and technique with exercises. Improved exercise technique, movement at target joints, use of target muscles after min  to mod verbal, visual, tactile cues.   Pt demonstrates high motivation during session today.He arrived late today so session is abbreviated. He is now exercising at the gym so therapy sessions have focused primarily on range of motion.Pt has been aggressively working on range of motion at home and therapist has also utilized multiple stretching and soft tissue techniques to work on his range of motion.Continued with similar techniques todayas pt is demonstrating slow but steady progress in range of motion. He plans to head to the gym after the therapy session today. Pt encouraged to continue his HEP.Ptwould benefit from further from skilled PT intervention in order to address  mobility, strength, and range of motion deficits in order to return to full function at home.                        PT Short Term Goals - 12/28/19 2041      PT SHORT TERM GOAL #1   Title  Pt will be independent with HEP in order to decrease knee pain and increase strength in order to improve pain-free function.    Time  6    Period  Weeks    Status  Achieved        PT Long Term Goals - 12/28/19 2041      PT LONG TERM GOAL #1   Title  Pt will increase LEFS to >60/80 in order to demonstrate significant improvement in lower extremity function.    Baseline  09/14/19: to be completed at next appointment 12/28: 13/80; 10/10/19: 29/80; 11/28/19: 30/80; 12/28/19: 27/80    Time  8    Period  Weeks    Status  Partially Met    Target Date  02/01/20      PT LONG TERM GOAL #2   Title  Pt will decrease worst pain as reported on NPRS by at least 3 points in order to demonstrate clinically significant reduction in ankle/foot pain.    Baseline  09/14/19: worst: 8/10; 10/10/19: 6/10; 11/28/19: 8/10; 12/28/19: 8/10;    Time  8    Period  Weeks    Status  On-going    Target Date  02/01/20      PT LONG TERM GOAL #3   Title  Pt will increase strength of L hip abduction and adduction by at least 1/2 MMT grade in order to demonstrate improvement in strength and function    Baseline  09/14/19: abduction: 4/5, adduction: 4-/5; 10/10/19: unchanged; 11/03/19: deferred; 11/28/19: abduction: 4/5, adduction: 4/5; 12/28/19: abduction: 4+/5, adduction: 4+/5;    Time  8    Period  Weeks    Status  Achieved      PT LONG TERM GOAL #4   Title  Pt will improve L knee range of motion to within 5 degrees of neutral extension and greater than 115 degrees of flexion in order to normalize gait and stair ascend/descend    Baseline  09/13/29: lacking 14 degrees extension, 77 degrees flexion; 10/10/19: Lacking 6 degrees extension to 78 degrees flexion; 11/03/19: lacking 2 degrees of extension to 83 degrees flexion;  11/28/19: lacking 2 degrees of extension to 83 degrees flexion; 12/28/19: 0-95 degrees AAROM with overpressure    Time  8    Period  Weeks    Status  Partially Met    Target Date  02/01/20      PT LONG TERM GOAL #5   Title  Patient will perform the 5x STS without UE  support with equal weight acceptance in LE's in <10 seconds to demonstrate functional strength and mobility.    Baseline  12/28: 14 seconds with no uE suppot, RLE significantly used    Time  8    Period  Weeks    Status  Deferred    Target Date  02/01/20            Plan - 01/04/20 1355    Clinical Impression Statement  Pt demonstrates high motivation during session today. He arrived late today so session is abbreviated. He is now exercising at the gym so therapy sessions have focused primarily on range of motion. Pt has been aggressively working on range of motion at home and therapist has also utilized multiple stretching and soft tissue techniques to work on his range of motion. Continued with similar techniques today as pt is demonstrating slow but steady progress in range of motion. He plans to head to the gym after the therapy session today. Pt encouraged to continue his HEP. Pt would benefit from further from skilled PT intervention in order to address mobility, strength, and range of motion deficits in order to return to full function at home.    Personal Factors and Comorbidities  Comorbidity 1;Past/Current Experience    Examination-Activity Limitations  Carry;Caring for Others;Locomotion Level;Stairs;Squat    Examination-Participation Restrictions  Community Activity;Yard Work    Stability/Clinical Decision Making  Stable/Uncomplicated    Rehab Potential  Good    PT Frequency  3x / week    PT Duration  8 weeks    PT Treatment/Interventions  ADLs/Self Care Home Management;Aquatic Therapy;Biofeedback;Canalith Repostioning;Cryotherapy;Electrical Stimulation;Iontophoresis 51m/ml Dexamethasone;Moist  Heat;Traction;Ultrasound;Gait training;DME Instruction;Stair training;Therapeutic exercise;Therapeutic activities;Balance training;Neuromuscular re-education;Patient/family education;Manual techniques;Passive range of motion;Scar mobilization;Dry needling;Vestibular;Joint Manipulations    PT Next Visit Plan  Continue working on knee flexion and extension ROM    PT Home Exercise Plan  Medbridge Access Code: [email or web address], also discussed performing heel prop in seated position in kitchen with foot propped on chair (nothing underneath knee) x 3 min during meal times    Consulted and Agree with Plan of Care  Patient       Patient will benefit from skilled therapeutic intervention in order to improve the following deficits and impairments:  Abnormal gait, Decreased balance, Decreased strength, Difficulty walking, Decreased range of motion, Pain  Visit Diagnosis: Muscle weakness (generalized)     Problem List Patient Active Problem List   Diagnosis Date Noted  . Left shoulder pain 12/13/2017  . Pre-diabetes 01/06/2017  . Snoring 04/02/2016  . Osteoarthritis of left knee 12/22/2014  . Peanut allergy   . Acute meniscal tear of knee   . Glaucoma   . Acute medial meniscal tear   . Right eye injury 11/11/2012   Alejandro GroutPT, DPT, GCS  Alejandro Beltran 01/04/2020, 3:51 PM  [place] [facility] Hospital[facility] [address] [address]Phone: [phone]  Fax:  [phone] Name: IEATHAN GROMANMRN: [id number] of Birth: 708-03-1966

## 2020-01-09 ENCOUNTER — Other Ambulatory Visit: Payer: Self-pay

## 2020-01-09 ENCOUNTER — Ambulatory Visit

## 2020-01-09 DIAGNOSIS — M6281 Muscle weakness (generalized): Secondary | ICD-10-CM

## 2020-01-09 NOTE — Therapy (Signed)
[place] MAIN [facility] SERVICES [address] [place], [place], [address] Phone: [phone]   Fax:  [phone]  Physical Therapy Treatment  Patient Details  Name: Alejandro Beltran MRN: [id number] Date of Birth: 10-Feb-1966 Referring Provider (PT): Dr. Chesley Mires   Encounter Date: 01/09/2020  PT End of Session - 01/09/20 1441    Visit Number  44    Number of Visits  60    Date for PT Re-Evaluation  02/01/20    Authorization Type  Eval: 09/14/19    PT Start Time  1435    PT Stop Time  1515    PT Time Calculation (min)  40 min    Equipment Utilized During Treatment  Gait belt    Activity Tolerance  Patient tolerated treatment well    Behavior During Therapy  [facility] for tasks assessed/performed       Past Medical History:  Diagnosis Date  . Acute medial meniscal tear    left  . Acute meniscal tear of knee   . Allergy   . Glaucoma   . Peanut allergy     Past Surgical History:  Procedure Laterality Date  . EYE SURGERY     rt retina sx  . MENISCUS REPAIR  08/2015    There were no vitals filed for this visit.  Subjective Assessment - 01/09/20 1439    Subjective  Pt reports that he is doing well today. He went to the gym before therapy session and is reported 3-4/10 L knee pain upon arrival. Pt has been consistent with his HEP. He is headed back over the gym after therapy session. No specific questions or concerns.    Pertinent History  Pt underwent L TKR 08/15/19. He started OP PT 2 days after surgery at [facility] in [place] however due to the distance of the drive he transferred his care to [facility]. Pt reports that his surgeon would like for him to get therapy 3 days/week and they were only able to schedule him for two days per week so he is transferring his care to [facility]. Pt has been performing his HEP at home which includes static L knee flexion and extension stretches as well as additional LLE strengthening. He continues to  utilize his [employer] Care as well. Pt had a meniscal repair of L knee in 2014 as well as a meniscal repair to his R knee in 2016. Prior to surgery he reports pain but fulll L knee range of motion. Other than his surgery pt denies any other recent changes to his health.    Patient Stated Goals  Improve L knee range of motion and strength as well as decrease pain    Currently in Pain?  Yes    Pain Score  4     Pain Location  Knee    Pain Orientation  Left    Pain Descriptors / Indicators  Aching    Pain Type  Surgical pain    Pain Onset  More than a month ago    Pain Frequency  Intermittent          TREATMENT   Manual Therapy Interim history obtained; Prone L knee passive extension stretch with10# ankle weightand STM to hamstrings to improve extensibility, towel under thighx 5 minutes; Supine L quad set into extension with overpressure by therapist 10s hold, repeated 3 times; L patella inferior,superior, medial, and lateralmobilizationsgrade III,20s/bout x3boutseach; Supine L femur on tibia AP mobilizations at end range extension, grade  III, 20s/bout x3bouts; Supine L tibia ER mobilizations at end range extension, grade III, 20s/bout x 2 bouts; Hooklying L tibia on femur AP mobilizations at end range flexion, grade III, 20s/bout x5.bouts; Seated L knee MET stretch 5s contract/5s relax x 3, repeated twice; L knee flexion AAROM with OP 95 degrees;   Pt educated throughout session about proper posture and technique with exercises. Improved exercise technique, movement at target joints, use of target muscles after min to mod verbal, visual, tactile cues.   Pt demonstrates high motivation during session today. He is now exercising at the gym so therapy sessions have focused primarily on range of motion.Pt has been aggressively working on range of motion at home and therapist has also utilized multiple stretching and soft tissue techniques to work on his range of motion.  His L knee extension AAROM is improving and now in supine the back of his knee touches the table.He plans to head to the gym after the therapy session today to finish his exercises.Pt encouraged to continue his HEP.Ptwould benefit from further from skilled PT intervention in order to address mobility, strength, and range of motion deficits in order to return to full function at home.                       PT Short Term Goals - 12/28/19 2041      PT SHORT TERM GOAL #1   Title  Pt will be independent with HEP in order to decrease knee pain and increase strength in order to improve pain-free function.    Time  6    Period  Weeks    Status  Achieved        PT Long Term Goals - 12/28/19 2041      PT LONG TERM GOAL #1   Title  Pt will increase LEFS to >60/80 in order to demonstrate significant improvement in lower extremity function.    Baseline  09/14/19: to be completed at next appointment 12/28: 13/80; 10/10/19: 29/80; 11/28/19: 30/80; 12/28/19: 27/80    Time  8    Period  Weeks    Status  Partially Met    Target Date  02/01/20      PT LONG TERM GOAL #2   Title  Pt will decrease worst pain as reported on NPRS by at least 3 points in order to demonstrate clinically significant reduction in ankle/foot pain.    Baseline  09/14/19: worst: 8/10; 10/10/19: 6/10; 11/28/19: 8/10; 12/28/19: 8/10;    Time  8    Period  Weeks    Status  On-going    Target Date  02/01/20      PT LONG TERM GOAL #3   Title  Pt will increase strength of L hip abduction and adduction by at least 1/2 MMT grade in order to demonstrate improvement in strength and function    Baseline  09/14/19: abduction: 4/5, adduction: 4-/5; 10/10/19: unchanged; 11/03/19: deferred; 11/28/19: abduction: 4/5, adduction: 4/5; 12/28/19: abduction: 4+/5, adduction: 4+/5;    Time  8    Period  Weeks    Status  Achieved      PT LONG TERM GOAL #4   Title  Pt will improve L knee range of motion to within 5 degrees of neutral  extension and greater than 115 degrees of flexion in order to normalize gait and stair ascend/descend    Baseline  09/13/29: lacking 14 degrees extension, 77 degrees flexion; 10/10/19: Lacking 6 degrees extension to  78 degrees flexion; 11/03/19: lacking 2 degrees of extension to 83 degrees flexion; 11/28/19: lacking 2 degrees of extension to 83 degrees flexion; 12/28/19: 0-95 degrees AAROM with overpressure    Time  8    Period  Weeks    Status  Partially Met    Target Date  02/01/20      PT LONG TERM GOAL #5   Title  Patient will perform the 5x STS without UE support with equal weight acceptance in LE's in <10 seconds to demonstrate functional strength and mobility.    Baseline  12/28: 14 seconds with no uE suppot, RLE significantly used    Time  8    Period  Weeks    Status  Deferred    Target Date  02/01/20            Plan - 01/09/20 1441    Clinical Impression Statement  Pt demonstrates high motivation during session today. He is now exercising at the gym so therapy sessions have focused primarily on range of motion. Pt has been aggressively working on range of motion at home and therapist has also utilized multiple stretching and soft tissue techniques to work on his range of motion. His L knee extension AAROM is improving and now in supine the back of his knee touches the table. He plans to head to the gym after the therapy session today to finish his exercises. Pt encouraged to continue his HEP. Pt would benefit from further from skilled PT intervention in order to address mobility, strength, and range of motion deficits in order to return to full function at home.    Personal Factors and Comorbidities  Comorbidity 1;Past/Current Experience    Examination-Activity Limitations  Carry;Caring for Others;Locomotion Level;Stairs;Squat    Examination-Participation Restrictions  Community Activity;Yard Work    Stability/Clinical Decision Making  Stable/Uncomplicated    Rehab Potential  Good     PT Frequency  3x / week    PT Duration  8 weeks    PT Treatment/Interventions  ADLs/Self Care Home Management;Aquatic Therapy;Biofeedback;Canalith Repostioning;Cryotherapy;Electrical Stimulation;Iontophoresis 77m/ml Dexamethasone;Moist Heat;Traction;Ultrasound;Gait training;DME Instruction;Stair training;Therapeutic exercise;Therapeutic activities;Balance training;Neuromuscular re-education;Patient/family education;Manual techniques;Passive range of motion;Scar mobilization;Dry needling;Vestibular;Joint Manipulations    PT Next Visit Plan  Continue working on knee flexion and extension ROM    PT Home Exercise Plan  Medbridge Access Code: [email or web address], also discussed performing heel prop in seated position in kitchen with foot propped on chair (nothing underneath knee) x 3 min during meal times    Consulted and Agree with Plan of Care  Patient       Patient will benefit from skilled therapeutic intervention in order to improve the following deficits and impairments:  Abnormal gait, Decreased balance, Decreased strength, Difficulty walking, Decreased range of motion, Pain  Visit Diagnosis: Muscle weakness (generalized)     Problem List Patient Active Problem List   Diagnosis Date Noted  . Left shoulder pain 12/13/2017  . Pre-diabetes 01/06/2017  . Snoring 04/02/2016  . Osteoarthritis of left knee 12/22/2014  . Peanut allergy   . Acute meniscal tear of knee   . Glaucoma   . Acute medial meniscal tear   . Right eye injury 11/11/2012    Alejandro Beltran 01/09/2020, 3:47 PM  CGailMAIN RSt Mary'S Vincent [facility] IncSERVICES [address]RDe[address] [place] [address]Phone: [phone]  Fax:  [phone] Name: Alejandro PUCILLOMRN: [id number] of Birth: 703-31-1967

## 2020-01-11 ENCOUNTER — Ambulatory Visit

## 2020-01-12 ENCOUNTER — Other Ambulatory Visit: Payer: Self-pay

## 2020-01-12 ENCOUNTER — Ambulatory Visit

## 2020-01-12 DIAGNOSIS — M6281 Muscle weakness (generalized): Secondary | ICD-10-CM

## 2020-01-12 DIAGNOSIS — M25562 Pain in left knee: Secondary | ICD-10-CM

## 2020-01-12 DIAGNOSIS — G8929 Other chronic pain: Secondary | ICD-10-CM

## 2020-01-12 NOTE — Therapy (Signed)
[place] MAIN [facility] SERVICES [address] [place], [place], [address] Phone: [phone]   Fax:  [phone]  Physical Therapy Treatment  Patient Details  Name: Alejandro Beltran MRN: [id number] Date of Birth: 1965-12-18 Referring Provider (PT): Dr. Chesley Mires   Encounter Date: 01/12/2020  PT End of Session - 01/12/20 0958    Visit Number  45    Number of Visits  60    Date for PT Re-Evaluation  02/01/20    Authorization Type  Eval: 09/14/19    PT Start Time  0935    PT Stop Time  1015    PT Time Calculation (min)  40 min    Equipment Utilized During Treatment  Gait belt    Activity Tolerance  Patient tolerated treatment well    Behavior During Therapy  [facility] for tasks assessed/performed       Past Medical History:  Diagnosis Date  . Acute medial meniscal tear    left  . Acute meniscal tear of knee   . Allergy   . Glaucoma   . Peanut allergy     Past Surgical History:  Procedure Laterality Date  . EYE SURGERY     rt retina sx  . MENISCUS REPAIR  08/2015    There were no vitals filed for this visit.  Subjective Assessment - 01/12/20 0942    Subjective  Pt reports that he is doing well today. He had a follow-up appointment with orthopedic surgeon yesterday and his labs were normal. He suggested that he might try to see Dr. Wynelle Link for an additional consult. He denies any pain upon arrival but reports his knee was slightly stiff when getting out of the care this morning. Pt has been consistent with his HEP. No specific concerns currently.    Pertinent History  Pt underwent L TKR 08/15/19. He started OP PT 2 days after surgery at [facility] in [place] however due to the distance of the drive he transferred his care to [facility]. Pt reports that his surgeon would like for him to get therapy 3 days/week and they were only able to schedule him for two days per week so he is transferring his care to [facility]. Pt has been  performing his HEP at home which includes static L knee flexion and extension stretches as well as additional LLE strengthening. He continues to utilize his Polar Care as well. Pt had a meniscal repair of L knee in 2014 as well as a meniscal repair to his R knee in 2016. Prior to surgery he reports pain but fulll L knee range of motion. Other than his surgery pt denies any other recent changes to his health.    Patient Stated Goals  Improve L knee range of motion and strength as well as decrease pain    Currently in Pain?  No/denies    Pain Onset  --           TREATMENT   Manual Therapy Interim history obtained; Prone L knee passive extension stretch with5# ankle weightand STM to hamstrings to improve extensibility, towel under thighx 5 minutes; Supine L quad set into extension with overpressure by therapist 10s hold, repeated 3 times; L patella inferior,superior, medial, and lateralmobilizationsgrade III,20s/bout x3boutseach; Supine L femur on tibia AP mobilizations at end range extension, grade III, 20s/bout x3bouts; Supine L tibia ER mobilizations at end range extension, grade III, 20s/bout x 2 bouts; Hooklying L tibia on femur AP mobilizations at end range  flexion, grade III, 20s/bout x5.bouts; Seated L knee MET stretch 5s contract/5s relax x 3, repeated twice; L knee extension: 2 degrees at rest with active quad extension pt reaches 0; L knee flexion AAROM with OP 95 degrees;   Pt educated throughout session about proper posture and technique with exercises. Improved exercise technique, movement at target joints, use of target muscles after min to mod verbal, visual, tactile cues.   Pt demonstrates high motivation during session today.He is now exercising at the gym so therapy sessions have focused primarily on range of motion.Pt has been aggressively working on range of motion at home and therapist has also utilized multiple stretching and soft tissue  techniques to work on his range of motion. His L knee extension AAROM is improving and now in supine the back of his knee touches the table. His L knee resting ROM for extension is lacking 2 degrees and with quad set he gets to 0. He continues to remain around 95 degrees of flexion with AAROM using aggressive overpressure by therapist.Consider discharge in the coming weeks if ROM progress for flexion plateuas.Pt encouraged to continue his HEP.Ptwould benefit from further from skilled PT intervention in order to address mobility, strength, and range of motion deficits in order to return to full function at home.                       PT Short Term Goals - 12/28/19 2041      PT SHORT TERM GOAL #1   Title  Pt will be independent with HEP in order to decrease knee pain and increase strength in order to improve pain-free function.    Time  6    Period  Weeks    Status  Achieved        PT Long Term Goals - 12/28/19 2041      PT LONG TERM GOAL #1   Title  Pt will increase LEFS to >60/80 in order to demonstrate significant improvement in lower extremity function.    Baseline  09/14/19: to be completed at next appointment 12/28: 13/80; 10/10/19: 29/80; 11/28/19: 30/80; 12/28/19: 27/80    Time  8    Period  Weeks    Status  Partially Met    Target Date  02/01/20      PT LONG TERM GOAL #2   Title  Pt will decrease worst pain as reported on NPRS by at least 3 points in order to demonstrate clinically significant reduction in ankle/foot pain.    Baseline  09/14/19: worst: 8/10; 10/10/19: 6/10; 11/28/19: 8/10; 12/28/19: 8/10;    Time  8    Period  Weeks    Status  On-going    Target Date  02/01/20      PT LONG TERM GOAL #3   Title  Pt will increase strength of L hip abduction and adduction by at least 1/2 MMT grade in order to demonstrate improvement in strength and function    Baseline  09/14/19: abduction: 4/5, adduction: 4-/5; 10/10/19: unchanged; 11/03/19: deferred; 11/28/19:  abduction: 4/5, adduction: 4/5; 12/28/19: abduction: 4+/5, adduction: 4+/5;    Time  8    Period  Weeks    Status  Achieved      PT LONG TERM GOAL #4   Title  Pt will improve L knee range of motion to within 5 degrees of neutral extension and greater than 115 degrees of flexion in order to normalize gait and stair ascend/descend  Baseline  09/13/29: lacking 14 degrees extension, 77 degrees flexion; 10/10/19: Lacking 6 degrees extension to 78 degrees flexion; 11/03/19: lacking 2 degrees of extension to 83 degrees flexion; 11/28/19: lacking 2 degrees of extension to 83 degrees flexion; 12/28/19: 0-95 degrees AAROM with overpressure    Time  8    Period  Weeks    Status  Partially Met    Target Date  02/01/20      PT LONG TERM GOAL #5   Title  Patient will perform the 5x STS without UE support with equal weight acceptance in LE's in <10 seconds to demonstrate functional strength and mobility.    Baseline  12/28: 14 seconds with no uE suppot, RLE significantly used    Time  8    Period  Weeks    Status  Deferred    Target Date  02/01/20            Plan - 01/12/20 0959    Clinical Impression Statement  Pt demonstrates high motivation during session today. He is now exercising at the gym so therapy sessions have focused primarily on range of motion. Pt has been aggressively working on range of motion at home and therapist has also utilized multiple stretching and soft tissue techniques to work on his range of motion. His L knee extension AAROM is improving and now in supine the back of his knee touches the table. His L knee resting ROM for extension is lacking 2 degrees and with quad set he gets to 0. He continues to remain around 95 degrees of flexion with AAROM using aggressive overpressure by therapist. Consider discharge in the coming weeks if ROM progress for flexion plateuas. Pt encouraged to continue his HEP. Pt would benefit from further from skilled PT intervention in order to address  mobility, strength, and range of motion deficits in order to return to full function at home.    Personal Factors and Comorbidities  Comorbidity 1;Past/Current Experience    Examination-Activity Limitations  Carry;Caring for Others;Locomotion Level;Stairs;Squat    Examination-Participation Restrictions  Community Activity;Yard Work    Stability/Clinical Decision Making  Stable/Uncomplicated    Rehab Potential  Good    PT Frequency  3x / week    PT Duration  8 weeks    PT Treatment/Interventions  ADLs/Self Care Home Management;Aquatic Therapy;Biofeedback;Canalith Repostioning;Cryotherapy;Electrical Stimulation;Iontophoresis 76m/ml Dexamethasone;Moist Heat;Traction;Ultrasound;Gait training;DME Instruction;Stair training;Therapeutic exercise;Therapeutic activities;Balance training;Neuromuscular re-education;Patient/family education;Manual techniques;Passive range of motion;Scar mobilization;Dry needling;Vestibular;Joint Manipulations    PT Next Visit Plan  Continue working on knee flexion and extension ROM    PT Home Exercise Plan  Medbridge Access Code: [email or web address], also discussed performing heel prop in seated position in kitchen with foot propped on chair (nothing underneath knee) x 3 min during meal times    Consulted and Agree with Plan of Care  Patient       Patient will benefit from skilled therapeutic intervention in order to improve the following deficits and impairments:  Abnormal gait, Decreased balance, Decreased strength, Difficulty walking, Decreased range of motion, Pain  Visit Diagnosis: Muscle weakness (generalized)  Chronic pain of left knee     Problem List Patient Active Problem List   Diagnosis Date Noted  . Left shoulder pain 12/13/2017  . Pre-diabetes 01/06/2017  . Snoring 04/02/2016  . Osteoarthritis of left knee 12/22/2014  . Peanut allergy   . Acute meniscal tear of knee   . Glaucoma   . Acute medial meniscal tear   . Right eye injury 11/11/2012   JCorene Cornea  D  Deavon Podgorski PT, DPT, GCS  Monasia Lair 01/12/2020, 11:33 AM  [place] MAIN [facility] SERVICES [address] [place], [place], [address] Phone: [phone]   Fax:  [phone]  Name: Alejandro Beltran MRN: [id number] Date of Birth: 1966-01-10

## 2020-01-16 ENCOUNTER — Ambulatory Visit

## 2020-01-18 ENCOUNTER — Ambulatory Visit

## 2020-01-18 ENCOUNTER — Other Ambulatory Visit: Payer: Self-pay

## 2020-01-18 DIAGNOSIS — G8929 Other chronic pain: Secondary | ICD-10-CM

## 2020-01-18 DIAGNOSIS — M6281 Muscle weakness (generalized): Secondary | ICD-10-CM

## 2020-01-18 DIAGNOSIS — M25562 Pain in left knee: Secondary | ICD-10-CM

## 2020-01-18 NOTE — Therapy (Signed)
[place] MAIN [facility] SERVICES [address] [place], [place], [address] Phone: [phone]   Fax:  [phone]  Physical Therapy Treatment  Patient Details  Name: Alejandro Beltran MRN: [id number] Date of Birth: [birth year] Referring Provider (PT): Dr. Chesley Mires   Encounter Date: 01/18/2020  PT End of Session - 01/18/20 1358    Visit Number  46    Number of Visits  60    Date for PT Re-Evaluation  02/01/20    Authorization Type  Eval: 09/14/19    PT Start Time  1355    PT Stop Time  1430    PT Time Calculation (min)  35 min    Equipment Utilized During Treatment  Gait belt    Activity Tolerance  Patient tolerated treatment well    Behavior During Therapy  [facility] for tasks assessed/performed       Past Medical History:  Diagnosis Date  . Acute medial meniscal tear    left  . Acute meniscal tear of knee   . Allergy   . Glaucoma   . Peanut allergy     Past Surgical History:  Procedure Laterality Date  . EYE SURGERY     rt retina sx  . MENISCUS REPAIR  08/2015    There were no vitals filed for this visit.  Subjective Assessment - 01/18/20 1358    Subjective  Pt reports that he is doing well today. He denies any pain upon arrival but reports his knee was slightly stiff when getting out of the car this morning. Pt has been consistent with his HEP. No specific concerns currently.    Pertinent History  Pt underwent L TKR 08/15/19. He started OP PT 2 days after surgery at [facility] in [place] however due to the distance of the drive he transferred his care to [facility]. Pt reports that his surgeon would like for him to get therapy 3 days/week and they were only able to schedule him for two days per week so he is transferring his care to [facility]. Pt has been performing his HEP at home which includes static L knee flexion and extension stretches as well as additional LLE strengthening. He continues to utilize his Polar Care as well.  Pt had a meniscal repair of L knee in 2014 as well as a meniscal repair to his R knee in 2016. Prior to surgery he reports pain but fulll L knee range of motion. Other than his surgery pt denies any other recent changes to his health.    Patient Stated Goals  Improve L knee range of motion and strength as well as decrease pain    Currently in Pain?  No/denies         TREATMENT   Manual Therapy Interim history obtained; Prone L knee passive extension stretch with10# ankle weight, towel under thighx 5 minutes during history; Supine L quad set into extension with overpressure by therapist 10s hold, repeated 3 times; L patella inferior,superior, medial, and lateralmobilizationsgrade III,20s/bout x3boutseach; SupineL femur on tibia AP mobilizations at end range extension, grade III, 20s/bout x3bouts; Hooklying L tibia on femur AP mobilizations at end range flexion, grade III, 20s/bout x5.bouts; Seated L knee MET stretch 5s contract/5s relax x 3, repeated twice; L knee flexion AAROM with OP 97 degrees;   Pt educated throughout session about proper posture and technique with exercises. Improved exercise technique, movement at target joints, use of target muscles after min to mod verbal, visual, tactile cues.  Pt demonstrates high motivation during session today.He is now exercising at the gym so therapy sessions have focused primarily on range of motion.Pt has been aggressively working on range of motion at home and therapist has also utilized multiple stretching and soft tissue techniques to work on his range of motion. He reports continued improvement in his L knee extension. His L knee flexion AAROM flexion is improved slightly today at 97 degrees with aggressive overpressure by therapist.  He continues to remain around 95 degrees of flexion with AAROM using aggressive overpressure by therapist.Plan is to discharge at end of current authorization period which ends  02/01/20. Pt encouraged to continue his HEP.Ptwould benefit from further from skilled PT intervention in order to address mobility, strength, and range of motion deficits in order to return to full function at home.                        PT Short Term Goals - 12/28/19 2041      PT SHORT TERM GOAL #1   Title  Pt will be independent with HEP in order to decrease knee pain and increase strength in order to improve pain-free function.    Time  6    Period  Weeks    Status  Achieved        PT Long Term Goals - 12/28/19 2041      PT LONG TERM GOAL #1   Title  Pt will increase LEFS to >60/80 in order to demonstrate significant improvement in lower extremity function.    Baseline  09/14/19: to be completed at next appointment 12/28: 13/80; 10/10/19: 29/80; 11/28/19: 30/80; 12/28/19: 27/80    Time  8    Period  Weeks    Status  Partially Met    Target Date  02/01/20      PT LONG TERM GOAL #2   Title  Pt will decrease worst pain as reported on NPRS by at least 3 points in order to demonstrate clinically significant reduction in ankle/foot pain.    Baseline  09/14/19: worst: 8/10; 10/10/19: 6/10; 11/28/19: 8/10; 12/28/19: 8/10;    Time  8    Period  Weeks    Status  On-going    Target Date  02/01/20      PT LONG TERM GOAL #3   Title  Pt will increase strength of L hip abduction and adduction by at least 1/2 MMT grade in order to demonstrate improvement in strength and function    Baseline  09/14/19: abduction: 4/5, adduction: 4-/5; 10/10/19: unchanged; 11/03/19: deferred; 11/28/19: abduction: 4/5, adduction: 4/5; 12/28/19: abduction: 4+/5, adduction: 4+/5;    Time  8    Period  Weeks    Status  Achieved      PT LONG TERM GOAL #4   Title  Pt will improve L knee range of motion to within 5 degrees of neutral extension and greater than 115 degrees of flexion in order to normalize gait and stair ascend/descend    Baseline  09/13/29: lacking 14 degrees extension, 77 degrees flexion;  10/10/19: Lacking 6 degrees extension to 78 degrees flexion; 11/03/19: lacking 2 degrees of extension to 83 degrees flexion; 11/28/19: lacking 2 degrees of extension to 83 degrees flexion; 12/28/19: 0-95 degrees AAROM with overpressure    Time  8    Period  Weeks    Status  Partially Met    Target Date  02/01/20      PT LONG TERM GOAL #  5   Title  Patient will perform the 5x STS without UE support with equal weight acceptance in LE's in <10 seconds to demonstrate functional strength and mobility.    Baseline  12/28: 14 seconds with no uE suppot, RLE significantly used    Time  8    Period  Weeks    Status  Deferred    Target Date  02/01/20            Plan - 01/18/20 1359    Clinical Impression Statement  Pt demonstrates high motivation during session today. He is now exercising at the gym so therapy sessions have focused primarily on range of motion. Pt has been aggressively working on range of motion at home and therapist has also utilized multiple stretching and soft tissue techniques to work on his range of motion.  He reports continued improvement in his L knee extension. His L knee flexion AAROM flexion is improved slightly today at 97 degrees with aggressive overpressure by therapist.  He continues to remain around 95 degrees of flexion with AAROM using aggressive overpressure by therapist. Plan is to discharge at end of current authorization period which ends 02/01/20. Pt encouraged to continue his HEP. Pt would benefit from further from skilled PT intervention in order to address mobility, strength, and range of motion deficits in order to return to full function at home.    Personal Factors and Comorbidities  Comorbidity 1;Past/Current Experience    Examination-Activity Limitations  Carry;Caring for Others;Locomotion Level;Stairs;Squat    Examination-Participation Restrictions  Community Activity;Yard Work    Stability/Clinical Decision Making  Stable/Uncomplicated    Rehab Potential   Good    PT Frequency  3x / week    PT Duration  8 weeks    PT Treatment/Interventions  ADLs/Self Care Home Management;Aquatic Therapy;Biofeedback;Canalith Repostioning;Cryotherapy;Electrical Stimulation;Iontophoresis 37m/ml Dexamethasone;Moist Heat;Traction;Ultrasound;Gait training;DME Instruction;Stair training;Therapeutic exercise;Therapeutic activities;Balance training;Neuromuscular re-education;Patient/family education;Manual techniques;Passive range of motion;Scar mobilization;Dry needling;Vestibular;Joint Manipulations    PT Next Visit Plan  Continue working on knee flexion and extension ROM    PT Home Exercise Plan  Medbridge Access Code: [email or web address], also discussed performing heel prop in seated position in kitchen with foot propped on chair (nothing underneath knee) x 3 min during meal times    Consulted and Agree with Plan of Care  Patient       Patient will benefit from skilled therapeutic intervention in order to improve the following deficits and impairments:  Abnormal gait, Decreased balance, Decreased strength, Difficulty walking, Decreased range of motion, Pain  Visit Diagnosis: Muscle weakness (generalized)  Chronic pain of left knee     Problem List Patient Active Problem List   Diagnosis Date Noted  . Left shoulder pain 12/13/2017  . Pre-diabetes 01/06/2017  . Snoring 04/02/2016  . Osteoarthritis of left knee 12/22/2014  . Peanut allergy   . Acute meniscal tear of knee   . Glaucoma   . Acute medial meniscal tear   . Right eye injury 11/11/2012   JPhillips GroutPT, DPT, GCS  Rayaan Lorah 01/18/2020, 3:57 PM  [place] [facility] [address] [place] [address]Phone: [phone]  Fax:  [phone] Name: Alejandro URIETAMRN: [id number] of Birth: [birth year]

## 2020-01-19 ENCOUNTER — Other Ambulatory Visit: Payer: Self-pay

## 2020-01-19 ENCOUNTER — Ambulatory Visit

## 2020-01-19 DIAGNOSIS — G8929 Other chronic pain: Secondary | ICD-10-CM

## 2020-01-19 DIAGNOSIS — M6281 Muscle weakness (generalized): Secondary | ICD-10-CM

## 2020-01-19 NOTE — Therapy (Signed)
[place] MAIN [facility] SERVICES [address] [place], [place], [address] Phone: [phone]   Fax:  [phone]  Physical Therapy Treatment  Patient Details  Name: Alejandro Beltran MRN: [id number] Date of Birth: 1966-08-04 Referring Provider (PT): Dr. Chesley Mires   Encounter Date: 01/19/2020  PT End of Session - 01/19/20 1446    Visit Number  47    Number of Visits  60    Date for PT Re-Evaluation  02/01/20    Authorization Type  Eval: 09/14/19    PT Start Time  1442    PT Stop Time  1515    PT Time Calculation (min)  33 min    Equipment Utilized During Treatment  Gait belt    Activity Tolerance  Patient tolerated treatment well    Behavior During Therapy  [facility] for tasks assessed/performed       Past Medical History:  Diagnosis Date  . Acute medial meniscal tear    left  . Acute meniscal tear of knee   . Allergy   . Glaucoma   . Peanut allergy     Past Surgical History:  Procedure Laterality Date  . EYE SURGERY     rt retina sx  . MENISCUS REPAIR  08/2015    There were no vitals filed for this visit.  Subjective Assessment - 01/19/20 1445    Subjective  Pt reports that he is doing well today. He denies any pain upon arrival but reports his knee was slightly stiff when getting out of the car this morning. Pt has been consistent with his HEP. No specific concerns currently.    Pertinent History  Pt underwent L TKR 08/15/19. He started OP PT 2 days after surgery at [facility] in [place] however due to the distance of the drive he transferred his care to [facility]. Pt reports that his surgeon would like for him to get therapy 3 days/week and they were only able to schedule him for two days per week so he is transferring his care to [facility]. Pt has been performing his HEP at home which includes static L knee flexion and extension stretches as well as additional LLE strengthening. He continues to utilize his Polar Care as well.  Pt had a meniscal repair of L knee in 2014 as well as a meniscal repair to his R knee in 2016. Prior to surgery he reports pain but fulll L knee range of motion. Other than his surgery pt denies any other recent changes to his health.    Patient Stated Goals  Improve L knee range of motion and strength as well as decrease pain    Currently in Pain?  No/denies           TREATMENT   Manual Therapy Interim history obtained; Prone L knee passive extension stretch with10# ankle weight, towel under thighx 5 minutes during history; Supine L quad set into extension with overpressure by therapist 10s hold, repeated 3 times; L patella inferior,superior, medial, and lateralmobilizationsgrade III,20s/bout x3boutseach; SupineL femur on tibia AP mobilizations at end range extension, grade III, 20s/bout x3bouts; Supine L tibia on femur ER mobilizations at end range extension, grade III, 20s/bout x 3 bouts; Hooklying L tibia on femur AP mobilizations at end range flexion, grade III, 20s/bout x5.bouts; Seated L knee MET stretch 5s contract/5s relax x 3, repeated twice;   Pt educated throughout session about proper posture and technique with exercises. Improved exercise technique, movement at target joints, use  of target muscles after min to mod verbal, visual, tactile cues.   Pt demonstrates high motivation during session today.He is now exercising at the gym so therapy sessions have focused primarily on range of motion.Pt has been aggressively working on range of motion at home and therapist has also utilized multiple stretching and soft tissue techniques to work on his range of motion.Plan is to discharge at end of current authorization period which ends 02/01/20. Pt encouraged to continue his HEP.Ptwould benefit from further from skilled PT intervention in order to address mobility, strength, and range of motion deficits in order to return to full function at  home.                           PT Short Term Goals - 12/28/19 2041      PT SHORT TERM GOAL #1   Title  Pt will be independent with HEP in order to decrease knee pain and increase strength in order to improve pain-free function.    Time  6    Period  Weeks    Status  Achieved        PT Long Term Goals - 12/28/19 2041      PT LONG TERM GOAL #1   Title  Pt will increase LEFS to >60/80 in order to demonstrate significant improvement in lower extremity function.    Baseline  09/14/19: to be completed at next appointment 12/28: 13/80; 10/10/19: 29/80; 11/28/19: 30/80; 12/28/19: 27/80    Time  8    Period  Weeks    Status  Partially Met    Target Date  02/01/20      PT LONG TERM GOAL #2   Title  Pt will decrease worst pain as reported on NPRS by at least 3 points in order to demonstrate clinically significant reduction in ankle/foot pain.    Baseline  09/14/19: worst: 8/10; 10/10/19: 6/10; 11/28/19: 8/10; 12/28/19: 8/10;    Time  8    Period  Weeks    Status  On-going    Target Date  02/01/20      PT LONG TERM GOAL #3   Title  Pt will increase strength of L hip abduction and adduction by at least 1/2 MMT grade in order to demonstrate improvement in strength and function    Baseline  09/14/19: abduction: 4/5, adduction: 4-/5; 10/10/19: unchanged; 11/03/19: deferred; 11/28/19: abduction: 4/5, adduction: 4/5; 12/28/19: abduction: 4+/5, adduction: 4+/5;    Time  8    Period  Weeks    Status  Achieved      PT LONG TERM GOAL #4   Title  Pt will improve L knee range of motion to within 5 degrees of neutral extension and greater than 115 degrees of flexion in order to normalize gait and stair ascend/descend    Baseline  09/13/29: lacking 14 degrees extension, 77 degrees flexion; 10/10/19: Lacking 6 degrees extension to 78 degrees flexion; 11/03/19: lacking 2 degrees of extension to 83 degrees flexion; 11/28/19: lacking 2 degrees of extension to 83 degrees flexion; 12/28/19: 0-95 degrees  AAROM with overpressure    Time  8    Period  Weeks    Status  Partially Met    Target Date  02/01/20      PT LONG TERM GOAL #5   Title  Patient will perform the 5x STS without UE support with equal weight acceptance in LE's in <10 seconds to demonstrate functional strength and  mobility.    Baseline  12/28: 14 seconds with no uE suppot, RLE significantly used    Time  8    Period  Weeks    Status  Deferred    Target Date  02/01/20            Plan - 01/19/20 1446    Clinical Impression Statement  Pt demonstrates high motivation during session today. He is now exercising at the gym so therapy sessions have focused primarily on range of motion. Pt has been aggressively working on range of motion at home and therapist has also utilized multiple stretching and soft tissue techniques to work on his range of motion. Plan is to discharge at end of current authorization period which ends 02/01/20. Pt encouraged to continue his HEP. Pt would benefit from further from skilled PT intervention in order to address mobility, strength, and range of motion deficits in order to return to full function at home.    Personal Factors and Comorbidities  Comorbidity 1;Past/Current Experience    Examination-Activity Limitations  Carry;Caring for Others;Locomotion Level;Stairs;Squat    Examination-Participation Restrictions  Community Activity;Yard Work    Stability/Clinical Decision Making  Stable/Uncomplicated    Rehab Potential  Good    PT Frequency  3x / week    PT Duration  8 weeks    PT Treatment/Interventions  ADLs/Self Care Home Management;Aquatic Therapy;Biofeedback;Canalith Repostioning;Cryotherapy;Electrical Stimulation;Iontophoresis 56m/ml Dexamethasone;Moist Heat;Traction;Ultrasound;Gait training;DME Instruction;Stair training;Therapeutic exercise;Therapeutic activities;Balance training;Neuromuscular re-education;Patient/family education;Manual techniques;Passive range of motion;Scar mobilization;Dry  needling;Vestibular;Joint Manipulations    PT Next Visit Plan  Continue working on knee flexion and extension ROM    PT Home Exercise Plan  Medbridge Access Code: [email or web address], also discussed performing heel prop in seated position in kitchen with foot propped on chair (nothing underneath knee) x 3 min during meal times    Consulted and Agree with Plan of Care  Patient       Patient will benefit from skilled therapeutic intervention in order to improve the following deficits and impairments:  Abnormal gait, Decreased balance, Decreased strength, Difficulty walking, Decreased range of motion, Pain  Visit Diagnosis: Muscle weakness (generalized)  Chronic pain of left knee     Problem List Patient Active Problem List   Diagnosis Date Noted  . Left shoulder pain 12/13/2017  . Pre-diabetes 01/06/2017  . Snoring 04/02/2016  . Osteoarthritis of left knee 12/22/2014  . Peanut allergy   . Acute meniscal tear of knee   . Glaucoma   . Acute medial meniscal tear   . Right eye injury 11/11/2012   JPhillips GroutPT, DPT, GCS  Tacie Mccuistion 01/19/2020, 5:30 PM  [place]MAIN [facility]La [facility] CenterSERVICES [address]RA[place] [address]Phone: [phone]  Fax:  [phone] Name: Alejandro PAMERMRN: [id number] of Birth: [birth year]

## 2020-01-24 ENCOUNTER — Ambulatory Visit: Attending: Nurse Practitioner

## 2020-01-24 DIAGNOSIS — M25562 Pain in left knee: Secondary | ICD-10-CM | POA: Insufficient documentation

## 2020-01-24 DIAGNOSIS — M6281 Muscle weakness (generalized): Secondary | ICD-10-CM | POA: Insufficient documentation

## 2020-01-24 DIAGNOSIS — G8929 Other chronic pain: Secondary | ICD-10-CM | POA: Insufficient documentation

## 2020-01-26 ENCOUNTER — Other Ambulatory Visit: Payer: Self-pay

## 2020-01-26 ENCOUNTER — Encounter

## 2020-01-26 ENCOUNTER — Ambulatory Visit

## 2020-01-26 DIAGNOSIS — M25562 Pain in left knee: Secondary | ICD-10-CM | POA: Diagnosis present

## 2020-01-26 DIAGNOSIS — G8929 Other chronic pain: Secondary | ICD-10-CM

## 2020-01-26 DIAGNOSIS — M6281 Muscle weakness (generalized): Secondary | ICD-10-CM

## 2020-01-26 NOTE — Therapy (Signed)
[place] MAIN [facility] SERVICES [address] [place], [place], [address] Phone: [phone]   Fax:  [phone]  Physical Therapy Treatment  Patient Details  Name: Alejandro Beltran MRN: [id number] Date of Birth: Sep 24, 1965 Referring Provider (PT): Dr. Chesley Mires   Encounter Date: 01/26/2020  PT End of Session - 01/26/20 1030    Visit Number  48    Number of Visits  60    Date for PT Re-Evaluation  02/01/20    Authorization Type  Eval: 09/14/19    PT Start Time  1028    PT Stop Time  1115    PT Time Calculation (min)  47 min    Equipment Utilized During Treatment  Gait belt    Activity Tolerance  Patient tolerated treatment well    Behavior During Therapy  [facility] for tasks assessed/performed       Past Medical History:  Diagnosis Date  . Acute medial meniscal tear    left  . Acute meniscal tear of knee   . Allergy   . Glaucoma   . Peanut allergy     Past Surgical History:  Procedure Laterality Date  . EYE SURGERY     rt retina sx  . MENISCUS REPAIR  08/2015    There were no vitals filed for this visit.  Subjective Assessment - 01/26/20 1030    Subjective  Pt reports that he is doing well today. He denies any pain upon arrival but reports his knee has been stiff recently. Pt has been consistent with his HEP. No specific concerns currently.    Pertinent History  Pt underwent L TKR 08/15/19. He started OP PT 2 days after surgery at [facility] in [place] however due to the distance of the drive he transferred his care to [facility]. Pt reports that his surgeon would like for him to get therapy 3 days/week and they were only able to schedule him for two days per week so he is transferring his care to [facility]. Pt has been performing his HEP at home which includes static L knee flexion and extension stretches as well as additional LLE strengthening. He continues to utilize his Polar Care as well. Pt had a meniscal repair of L knee in  2014 as well as a meniscal repair to his R knee in 2016. Prior to surgery he reports pain but fulll L knee range of motion. Other than his surgery pt denies any other recent changes to his health.    Patient Stated Goals  Improve L knee range of motion and strength as well as decrease pain    Currently in Pain?  No/denies         TREATMENT   Manual Therapy Interim history obtained; Prone L knee passive extension stretch with10# ankle weight, towel under thighx 5 minutes during history; Prone tibia on femur PA mobilization at available end range flexion grade III, 20s/bout x 3 bouts; Prone L quad stretch 20s hold x 2; Supine L quad set into extension with overpressure by therapist 10s hold, repeated 3 times; L patella inferior,superior, medial, and lateralmobilizationsgrade III,20s/bout x3boutseach; SupineL femur on tibia AP mobilizations at end range extension, grade III, 20s/bout x5bouts; Hooklying L tibia on femur AP mobilizations at end range flexion, grade III, 20s/bout x5bouts; Seated L knee MET stretch 5s contract/5s relax x 3, repeated three times; L knee AAROM with overpressure: 0 degrees extension at rest without overpressure, 98 degrees flexion with overpressure;   Pt educated  throughout session about proper posture and technique with exercises. Improved exercise technique, movement at target joints, use of target muscles after min to mod verbal, visual, tactile cues.   Pt demonstrates high motivation during session today.He is now exercising at the gym so therapy sessions have focused primarily on range of motion.Pt has been aggressively working on range of motion at home and therapist has also utilized multiple stretching and soft tissue techniques to work on his range of motion.His L knee extension has improved so that it is now at 0 degrees without any overpressure. His flexion is at 98 degrees with overpressure. Plan is to discharge at next session.  Pt encouraged to continue his HEP.Hewould benefit from further from skilled PT intervention in order to address mobility, strength, and range of motion deficits in order to return to full function at home.                                PT Short Term Goals - 12/28/19 2041      PT SHORT TERM GOAL #1   Title  Pt will be independent with HEP in order to decrease knee pain and increase strength in order to improve pain-free function.    Time  6    Period  Weeks    Status  Achieved        PT Long Term Goals - 12/28/19 2041      PT LONG TERM GOAL #1   Title  Pt will increase LEFS to >60/80 in order to demonstrate significant improvement in lower extremity function.    Baseline  09/14/19: to be completed at next appointment 12/28: 13/80; 10/10/19: 29/80; 11/28/19: 30/80; 12/28/19: 27/80    Time  8    Period  Weeks    Status  Partially Met    Target Date  02/01/20      PT LONG TERM GOAL #2   Title  Pt will decrease worst pain as reported on NPRS by at least 3 points in order to demonstrate clinically significant reduction in ankle/foot pain.    Baseline  09/14/19: worst: 8/10; 10/10/19: 6/10; 11/28/19: 8/10; 12/28/19: 8/10;    Time  8    Period  Weeks    Status  On-going    Target Date  02/01/20      PT LONG TERM GOAL #3   Title  Pt will increase strength of L hip abduction and adduction by at least 1/2 MMT grade in order to demonstrate improvement in strength and function    Baseline  09/14/19: abduction: 4/5, adduction: 4-/5; 10/10/19: unchanged; 11/03/19: deferred; 11/28/19: abduction: 4/5, adduction: 4/5; 12/28/19: abduction: 4+/5, adduction: 4+/5;    Time  8    Period  Weeks    Status  Achieved      PT LONG TERM GOAL #4   Title  Pt will improve L knee range of motion to within 5 degrees of neutral extension and greater than 115 degrees of flexion in order to normalize gait and stair ascend/descend    Baseline  09/13/29: lacking 14 degrees extension, 77 degrees  flexion; 10/10/19: Lacking 6 degrees extension to 78 degrees flexion; 11/03/19: lacking 2 degrees of extension to 83 degrees flexion; 11/28/19: lacking 2 degrees of extension to 83 degrees flexion; 12/28/19: 0-95 degrees AAROM with overpressure    Time  8    Period  Weeks    Status  Partially Met  Target Date  02/01/20      PT LONG TERM GOAL #5   Title  Patient will perform the 5x STS without UE support with equal weight acceptance in LE's in <10 seconds to demonstrate functional strength and mobility.    Baseline  12/28: 14 seconds with no uE suppot, RLE significantly used    Time  8    Period  Weeks    Status  Deferred    Target Date  02/01/20            Plan - 01/26/20 1031    Clinical Impression Statement  Pt demonstrates high motivation during session today. He is now exercising at the gym so therapy sessions have focused primarily on range of motion. Pt has been aggressively working on range of motion at home and therapist has also utilized multiple stretching and soft tissue techniques to work on his range of motion. His L knee extension has improved so that it is now at 0 degrees without any overpressure. His flexion is at 98 degrees with overpressure. Plan is to discharge at next session. Pt encouraged to continue his HEP. He would benefit from further from skilled PT intervention in order to address mobility, strength, and range of motion deficits in order to return to full function at home.    Personal Factors and Comorbidities  Comorbidity 1;Past/Current Experience    Examination-Activity Limitations  Carry;Caring for Others;Locomotion Level;Stairs;Squat    Examination-Participation Restrictions  Community Activity;Yard Work    Stability/Clinical Decision Making  Stable/Uncomplicated    Rehab Potential  Good    PT Frequency  3x / week    PT Duration  8 weeks    PT Treatment/Interventions  ADLs/Self Care Home Management;Aquatic Therapy;Biofeedback;Canalith  Repostioning;Cryotherapy;Electrical Stimulation;Iontophoresis 104m/ml Dexamethasone;Moist Heat;Traction;Ultrasound;Gait training;DME Instruction;Stair training;Therapeutic exercise;Therapeutic activities;Balance training;Neuromuscular re-education;Patient/family education;Manual techniques;Passive range of motion;Scar mobilization;Dry needling;Vestibular;Joint Manipulations    PT Next Visit Plan  Continue working on knee flexion and extension ROM    PT Home Exercise Plan  Medbridge Access Code: [email or web address], also discussed performing heel prop in seated position in kitchen with foot propped on chair (nothing underneath knee) x 3 min during meal times    Consulted and Agree with Plan of Care  Patient       Patient will benefit from skilled therapeutic intervention in order to improve the following deficits and impairments:  Abnormal gait, Decreased balance, Decreased strength, Difficulty walking, Decreased range of motion, Pain  Visit Diagnosis: Muscle weakness (generalized)  Chronic pain of left knee     Problem List Patient Active Problem List   Diagnosis Date Noted  . Left shoulder pain 12/13/2017  . Pre-diabetes 01/06/2017  . Snoring 04/02/2016  . Osteoarthritis of left knee 12/22/2014  . Peanut allergy   . Acute meniscal tear of knee   . Glaucoma   . Acute medial meniscal tear   . Right eye injury 11/11/2012   JPhillips GroutPT, DPT, GCS  Venia Riveron 01/26/2020, 1:00 PM  CNew[place]MAIN [facility]CSERVICES [address] [address]Phone: [phone]  Fax:  [phone] Name: Alejandro TURRELLMRN: [id number] of Birth: [birth year]

## 2020-01-31 ENCOUNTER — Ambulatory Visit

## 2020-01-31 DIAGNOSIS — M6281 Muscle weakness (generalized): Secondary | ICD-10-CM

## 2020-01-31 DIAGNOSIS — G8929 Other chronic pain: Secondary | ICD-10-CM

## 2020-02-01 ENCOUNTER — Ambulatory Visit

## 2020-02-01 ENCOUNTER — Other Ambulatory Visit: Payer: Self-pay

## 2020-02-01 DIAGNOSIS — M25562 Pain in left knee: Secondary | ICD-10-CM

## 2020-02-01 DIAGNOSIS — M6281 Muscle weakness (generalized): Secondary | ICD-10-CM

## 2020-02-01 NOTE — Therapy (Signed)
[place] [facility] SERVICES [address] [place], [place], [address] Phone: [phone]   Fax:  [phone]  Physical Therapy Treatment/Discharge  Patient Details  Name: Alejandro Beltran MRN: [id number] Date of Birth: [birth year] Referring Provider (PT): Dr. Chesley Mires   Encounter Date: 02/01/2020  PT End of Session - 02/01/20 1032    Visit Number  64    Number of Visits  60    Date for PT Re-Evaluation  02/01/20    Authorization Type  Eval: 09/14/19    PT Start Time  1030    PT Stop Time  1115    PT Time Calculation (min)  45 min    Equipment Utilized During Treatment  Gait belt    Activity Tolerance  Patient tolerated treatment well    Behavior During Therapy  [facility] for tasks assessed/performed       Past Medical History:  Diagnosis Date  . Acute medial meniscal tear    left  . Acute meniscal tear of knee   . Allergy   . Glaucoma   . Peanut allergy     Past Surgical History:  Procedure Laterality Date  . EYE SURGERY     rt retina sx  . MENISCUS REPAIR  08/2015    There were no vitals filed for this visit.  Subjective Assessment - 02/01/20 1032    Subjective  Pt reports that he is doing well today. He denies any pain upon arrival but reports his knee has been stiff especially when getting out of the car and walking in this morning. Pt has been consistent with his HEP. No specific concerns currently. He is ready for discharge today.    Pertinent History  Pt underwent L TKR 08/15/19. He started OP PT 2 days after surgery at [facility] in [place] however due to the distance of the drive he transferred his care to [facility]. Pt reports that his surgeon would like for him to get therapy 3 days/week and they were only able to schedule him for two days per week so he is transferring his care to [facility]. Pt has been performing his HEP at home which includes static L knee flexion and extension stretches as well as additional LLE  strengthening. He continues to utilize his [facility] as well. Pt had a meniscal repair of L knee in 2014 as well as a meniscal repair to his R knee in 2016. Prior to surgery he reports pain but fulll L knee range of motion. Other than his surgery pt denies any other recent changes to his health.    Patient Stated Goals  Improve L knee range of motion and strength as well as decrease pain    Currently in Pain?  No/denies           TREATMENT   Manual Therapy Interim history obtained;  Updated outcome measures/goals with patient: LEFS: 26/80 5TSTS: 10.8s MMT LLE: abduction: 4+/5, adduction: 4+/5;  L knee AAROM with overpressure: 0degrees extension at rest supine, 99degrees of flexion supine with overpressure;  Prone L knee passive extension stretch with10# ankle weight, towel under thigh, STM to hamstringx 5 minutes during history; Prone tibia on femur PA mobilization at available end range flexion grade III, 20s/bout x 3 bouts; Prone L quad stretch 20s hold x 2; Supine L quad set into extension with overpressure by therapist 10s hold, repeated 3 times; L patella inferior,superior, medial, and lateralmobilizationsgrade III,20s/bout x3boutseach; SupineL tibia on femur ER mobilizations at  end range extension, grade III, 20s/bout x3bouts; Hooklying L tibia on femur AP mobilizations at end range flexion, grade III, 20s/bout x5bouts; Seated L knee MET stretch 5s contract/5s relax x 3, repeated two times; L knee AAROM with overpressure: 0 degrees extension at rest without overpressure, 99 degrees flexion with overpressure;   Pt educated throughout session about proper posture and technique with exercises. Improved exercise technique, movement at target joints, use of target muscles after min to mod verbal, visual, tactile cues.   Pt demonstrates high motivation during session today. Today is his last appointment and he will be discharged after this visit.He is  exercising at the gym and for the last couple months therapy sessions focused primarily on range of motion.Pt has been aggressively working on range of motion at home and therapist has also utilized multiple stretching and soft tissue techniques to work on his range of motion.His L knee extension has improved so that it is now at 0 degrees without any overpressure at rest. His flexion is at 99 degrees with overpressure. His L hip abduction and adduction are both at 4+/5 and his worst pain has decreased to 5/10. His L knee is primarily stiff and pain is infrequent. Pt encouraged to continue his HEP especially his stretching/ROM exercises. Encouraged pt to follow-up with his orthopedic surgeon as needed.              PT Short Term Goals - 02/01/20 1029      PT SHORT TERM GOAL #1   Title  Pt will be independent with HEP in order to decrease knee pain and increase strength in order to improve pain-free function.    Time  6    Period  Weeks    Status  Achieved        PT Long Term Goals - 02/01/20 1029      PT LONG TERM GOAL #1   Title  Pt will increase LEFS to >60/80 in order to demonstrate significant improvement in lower extremity function.    Baseline  09/14/19: to be completed at next appointment 12/28: 13/80; 10/10/19: 29/80; 11/28/19: 30/80; 12/28/19: 27/80; 02/01/20: 26/80;    Time  8    Period  Weeks    Status  Partially Met    Target Date  --      PT LONG TERM GOAL #2   Title  Pt will decrease worst pain as reported on NPRS by at least 3 points in order to demonstrate clinically significant reduction in ankle/foot pain.    Baseline  09/14/19: worst: 8/10; 10/10/19: 6/10; 11/28/19: 8/10; 12/28/19: 8/10; 02/01/20: 5/10    Time  8    Period  Weeks    Status  Achieved      PT LONG TERM GOAL #3   Title  Pt will increase strength of L hip abduction and adduction by at least 1/2 MMT grade in order to demonstrate improvement in strength and function    Baseline  09/14/19: abduction:  4/5, adduction: 4-/5; 10/10/19: unchanged; 11/03/19: deferred; 11/28/19: abduction: 4/5, adduction: 4/5; 12/28/19: abduction: 4+/5, adduction: 4+/5; 02/01/20: abduction: 4+/5, adduction: 4+/5;    Time  8    Period  Weeks    Status  Achieved      PT LONG TERM GOAL #4   Title  Pt will improve L knee range of motion to within 5 degrees of neutral extension and greater than 115 degrees of flexion in order to normalize gait and stair ascend/descend    Baseline  09/13/29: lacking 14 degrees extension, 77 degrees flexion; 10/10/19: Lacking 6 degrees extension to 78 degrees flexion; 11/03/19: lacking 2 degrees of extension to 83 degrees flexion; 11/28/19: lacking 2 degrees of extension to 83 degrees flexion; 12/28/19: 0-95 degrees AAROM with overpressure; 02/01/20: 0-99 degrees AAROM with overpressure for flexion only    Time  8    Period  Weeks    Status  Partially Met      PT LONG TERM GOAL #5   Title  Patient will perform the 5x STS without UE support with equal weight acceptance in LE's in <10 seconds to demonstrate functional strength and mobility.    Baseline  12/28: 14 seconds with no uE suppot, RLE significantly used; 02/01/20: 10.8s    Time  8    Period  Weeks    Status  Partially Met            Plan - 02/01/20 1033    Clinical Impression Statement  Pt demonstrates high motivation during session today. Today is his last appointment and he will be discharged after this visit. He is exercising at the gym and for the last couple months therapy sessions focused primarily on range of motion. Pt has been aggressively working on range of motion at home and therapist has also utilized multiple stretching and soft tissue techniques to work on his range of motion. His L knee extension has improved so that it is now at 0 degrees without any overpressure at rest. His flexion is at 99 degrees with overpressure. His L hip abduction and adduction are both at 4+/5 and his worst pain has decreased to 5/10. His L knee is  primarily stiff and pain is infrequent. Pt encouraged to continue his HEP especially his stretching/ROM exercises. Encouraged pt to follow-up with his orthopedic surgeon as needed.    Personal Factors and Comorbidities  Comorbidity 1;Past/Current Experience    Examination-Activity Limitations  Carry;Caring for Others;Locomotion Level;Stairs;Squat    Examination-Participation Restrictions  Community Activity;Yard Work    Stability/Clinical Decision Making  Stable/Uncomplicated    Rehab Potential  Good    PT Frequency  3x / week    PT Duration  8 weeks    PT Treatment/Interventions  ADLs/Self Care Home Management;Aquatic Therapy;Biofeedback;Canalith Repostioning;Cryotherapy;Electrical Stimulation;Iontophoresis 20m/ml Dexamethasone;Moist Heat;Traction;Ultrasound;Gait training;DME Instruction;Stair training;Therapeutic exercise;Therapeutic activities;Balance training;Neuromuscular re-education;Patient/family education;Manual techniques;Passive range of motion;Scar mobilization;Dry needling;Vestibular;Joint Manipulations    PT Next Visit Plan  Discharge    PT Home Exercise Plan  Medbridge Access Code: [email or web address], also discussed performing heel prop in seated position in kitchen with foot propped on chair (nothing underneath knee) x 3 min during meal times    Consulted and Agree with Plan of Care  Patient       Patient will benefit from skilled therapeutic intervention in order to improve the following deficits and impairments:  Abnormal gait, Decreased balance, Decreased strength, Difficulty walking, Decreased range of motion, Pain  Visit Diagnosis: Muscle weakness (generalized)  Chronic pain of left knee     Problem List Patient Active Problem List   Diagnosis Date Noted  . Left shoulder pain 12/13/2017  . Pre-diabetes 01/06/2017  . Snoring 04/02/2016  . Osteoarthritis of left knee 12/22/2014  . Peanut allergy   . Acute meniscal tear of knee   . Glaucoma   . Acute medial meniscal tear    . Right eye injury 11/11/2012   JLyndel SafeHuprich PT, DPT, GCS  Ivyanna Sibert 02/01/2020, 12:52 PM  [facility]  [place], [place], [address] Phone: [phone]   Fax:  [phone]  Name: DANNIEL TONES MRN: [id number] Date of Birth: 08/25/1966

## 2020-02-02 ENCOUNTER — Ambulatory Visit

## 2020-02-07 ENCOUNTER — Encounter

## 2020-02-09 ENCOUNTER — Encounter

## 2020-02-13 ENCOUNTER — Encounter

## 2020-02-16 ENCOUNTER — Encounter

## 2020-02-21 ENCOUNTER — Encounter

## 2020-02-23 ENCOUNTER — Encounter

## 2020-02-28 ENCOUNTER — Encounter

## 2020-03-01 ENCOUNTER — Encounter

## 2020-03-06 ENCOUNTER — Encounter

## 2020-03-08 ENCOUNTER — Encounter

## 2020-03-13 ENCOUNTER — Encounter

## 2020-03-15 ENCOUNTER — Encounter

## 2020-03-20 ENCOUNTER — Encounter

## 2020-03-22 ENCOUNTER — Encounter
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
## 2020-05-16 ENCOUNTER — Encounter: Admitting: Family Medicine

## 2020-05-31 ENCOUNTER — Ambulatory Visit (HOSPITAL_COMMUNITY): Admission: EM | Admit: 2020-05-31 | Discharge: 2020-05-31 | Discharge status: Against Medical Advice

## 2020-05-31 ENCOUNTER — Other Ambulatory Visit: Payer: Self-pay

## 2020-05-31 NOTE — ED Notes (Signed)
No answer x 1, called pt cell phone

## 2020-05-31 NOTE — ED Notes (Signed)
Called pt x2 no answer

## 2020-06-01 ENCOUNTER — Other Ambulatory Visit: Payer: Self-pay

## 2020-06-01 ENCOUNTER — Ambulatory Visit (HOSPITAL_COMMUNITY)
Admission: EM | Admit: 2020-06-01 | Discharge: 2020-06-01 | Disposition: A | Discharge status: Home / Self Care | Attending: Urgent Care | Admitting: Urgent Care

## 2020-06-01 ENCOUNTER — Encounter (HOSPITAL_COMMUNITY): Payer: Self-pay

## 2020-06-01 ENCOUNTER — Ambulatory Visit (INDEPENDENT_AMBULATORY_CARE_PROVIDER_SITE_OTHER)

## 2020-06-01 DIAGNOSIS — R059 Cough, unspecified: Secondary | ICD-10-CM

## 2020-06-01 DIAGNOSIS — R05 Cough: Secondary | ICD-10-CM | POA: Diagnosis present

## 2020-06-01 DIAGNOSIS — Z79899 Other long term (current) drug therapy: Secondary | ICD-10-CM | POA: Insufficient documentation

## 2020-06-01 DIAGNOSIS — J0100 Acute maxillary sinusitis, unspecified: Secondary | ICD-10-CM | POA: Diagnosis not present

## 2020-06-01 DIAGNOSIS — Z791 Long term (current) use of non-steroidal anti-inflammatories (NSAID): Secondary | ICD-10-CM | POA: Insufficient documentation

## 2020-06-01 DIAGNOSIS — Z20822 Contact with and (suspected) exposure to covid-19: Secondary | ICD-10-CM | POA: Diagnosis not present

## 2020-06-01 DIAGNOSIS — H409 Unspecified glaucoma: Secondary | ICD-10-CM | POA: Insufficient documentation

## 2020-06-01 DIAGNOSIS — R7303 Prediabetes: Secondary | ICD-10-CM | POA: Diagnosis not present

## 2020-06-01 MED ORDER — BENZONATATE 100 MG PO CAPS: 100.0000 mg | ORAL_CAPSULE | Freq: Three times a day (TID) | ORAL | 0 refills | Status: DC

## 2020-06-01 MED ORDER — FLUTICASONE PROPIONATE 50 MCG/ACT NA SUSP: 1.0000 | Freq: Every day | NASAL | 2 refills | Status: DC

## 2020-06-01 MED ORDER — AMOXICILLIN-POT CLAVULANATE 875-125 MG PO TABS: 1.0000 | ORAL_TABLET | Freq: Two times a day (BID) | ORAL | 0 refills | Status: AC

## 2020-06-01 NOTE — ED Triage Notes (Signed)
Patient presents to Urgent Care with complaints of productive cough since about 2 weeks ago. Patient reports he has been using otc cough suppressant., has helped some. Pt has been vaccinated for covid. Pt would like to be covid tested today.  Pt also requesting a testosterone injection here today, brought his medication with him,

## 2020-06-01 NOTE — ED Provider Notes (Signed)
[facility]    CSN: [id number] Arrival date & time: 06/01/20  1704      History   Chief Complaint Chief Complaint  Patient presents with   Cough    HPI Alejandro Beltran is a 54 y.o. male.   Patient presents for evaluation of cough.  He reports his cough has been present for about 3 weeks.  Reports the cough has been improving and is less frequent, however he does have some sputum coming up with the cough at this point.  Reports initially cough more severe.  Denies any shortness of breath at any given time during this.  Does endorse some nasal congestion.  Denies sore throat.  Denies headache.  Denies fever or chills.  Denies change in taste or smell.  Received both Covid vaccines.  Received a Covid test about a week into his initial cough.  This was negative.  He is requesting Covid testing today.     Past Medical History:  Diagnosis Date   Acute medial meniscal tear    left   Acute meniscal tear of knee    Allergy    Glaucoma    Peanut allergy     Patient Active Problem List   Diagnosis Date Noted   Left shoulder pain 12/13/2017   Pre-diabetes 01/06/2017   Snoring 04/02/2016   Osteoarthritis of left knee 12/22/2014   Peanut allergy    Acute meniscal tear of knee    Glaucoma    Acute medial meniscal tear    Right eye injury 11/11/2012    Past Surgical History:  Procedure Laterality Date   EYE SURGERY     rt retina sx   MENISCUS REPAIR  08/2015       Home Medications    Prior to Admission medications   Medication Sig Start Date End Date Taking? Authorizing Provider  amoxicillin-clavulanate (AUGMENTIN) 875-125 MG tablet Take 1 tablet by mouth 2 (two) times daily for 10 days. 06/01/20 06/11/20  Mehki Klumpp, Veryl Speak, PA-C  benzonatate (TESSALON) 100 MG capsule Take 1 capsule (100 mg total) by mouth every 8 (eight) hours. 06/01/20   Latasia Silberstein, Veryl Speak, PA-C  brimonidine-timolol (COMBIGAN) 0.2-0.5 % ophthalmic solution Place 1 drop into both eyes  every 12 (twelve) hours.    [provider]  EPINEPHrine (EPIPEN 2-PAK) 0.3 mg/0.3 mL IJ SOAJ injection Inject 0.3 mLs (0.3 mg total) into the muscle once as needed. 05/14/17   Copland, Gwenlyn Found, MD  fluticasone (FLONASE) 50 MCG/ACT nasal spray Place 1 spray into both nostrils daily. 06/01/20   Farin Buhman, Veryl Speak, PA-C  ibuprofen (ADVIL) 800 MG tablet  05/16/19   [provider]  Multiple Vitamin (MULTIVITAMIN) tablet Take 1 tablet by mouth daily.    [provider]  sildenafil (VIAGRA) 100 MG tablet TAKE ONE-HALF (1/2) TO ONE TABLET DAILY AS NEEDED FOR ERECTILE DYSFUNCTION 08/22/19   Copland, Gwenlyn Found, MD    Family History Family History  Problem Relation Age of Onset   Hypertension Mother    Anuerysm Father    Diabetes Maternal Grandmother    Stroke Maternal Grandmother    Cancer Maternal Grandfather    Colon cancer Neg Hx     Social History Social History   Tobacco Use   Smoking status: Passive Smoke Exposure - Never Smoker   Smokeless tobacco: Never Used  Building services engineer Use: Never used  Substance Use Topics   Alcohol use: Yes    Alcohol/week: 2.0 standard drinks  Types: 2 Standard drinks or equivalent per week    Comment: socially   Drug use: No     Allergies   Other, Peanuts [peanut oil], Pecan extract allergy skin test, and Pistachio nut extract skin test   Review of Systems Review of Systems   Physical Exam Triage Vital Signs ED Triage Vitals  Enc Vitals Group     BP 06/01/20 1801 (!) 138/92     Pulse Rate 06/01/20 1801 70     Resp 06/01/20 1801 16     Temp 06/01/20 1801 98.7 F (37.1 C)     Temp Source 06/01/20 1801 Oral     SpO2 06/01/20 1801 99 %     Weight --      Height --      Head Circumference --      Peak Flow --      Pain Score 06/01/20 1800 0     Pain Loc --      Pain Edu? --      Excl. in GC? --    No data found.  Updated Vital Signs BP (!) 138/92 (BP Location: Right Arm)    Pulse 70    Temp  98.7 F (37.1 C) (Oral)    Resp 16    SpO2 99%   Visual Acuity Right Eye Distance:   Left Eye Distance:   Bilateral Distance:    Right Eye Near:   Left Eye Near:    Bilateral Near:     Physical Exam Vitals and nursing note reviewed.  Constitutional:      General: He is not in acute distress.    Appearance: Normal appearance. He is well-developed. He is not ill-appearing.  HENT:     Head: Normocephalic and atraumatic.     Comments: Some maxillary sinus pressure    Right Ear: Tympanic membrane normal.     Ears:     Comments: Significant amount of cerumen in the left canal, partially removed.  Able to visualize portion of tympanic membrane, gray without erythema.    Nose: Congestion and rhinorrhea present.     Mouth/Throat:     Mouth: Mucous membranes are moist.     Comments: Postnasal drip visible. Eyes:     Extraocular Movements: Extraocular movements intact.     Conjunctiva/sclera: Conjunctivae normal.     Pupils: Pupils are equal, round, and reactive to light.  Cardiovascular:     Rate and Rhythm: Normal rate and regular rhythm.     Heart sounds: No murmur heard.   Pulmonary:     Effort: Pulmonary effort is normal. No respiratory distress.     Breath sounds: Normal breath sounds. No wheezing, rhonchi or rales.  Abdominal:     Palpations: Abdomen is soft.     Tenderness: There is no abdominal tenderness.  Musculoskeletal:     Cervical back: Neck supple.     Right lower leg: No edema.     Left lower leg: No edema.  Skin:    General: Skin is warm and dry.  Neurological:     Mental Status: He is alert.      UC Treatments / Results  Labs (all labs ordered are listed, but only abnormal results are displayed) Labs Reviewed  SARS CORONAVIRUS 2 (TAT 6-24 HRS)    EKG   Radiology DG Chest 2 View  Result Date: 06/01/2020 CLINICAL DATA:  Persistent cough EXAM: CHEST - 2 VIEW COMPARISON:  None. FINDINGS: The heart size and mediastinal contours are within  normal  limits. Both lungs are clear. The visualized skeletal structures are unremarkable. IMPRESSION: No active cardiopulmonary disease. Electronically Signed   By: Jasmine Pang M.D.   On: 06/01/2020 19:20    Procedures Procedures (including critical care time)  Medications Ordered in UC Medications - No data to display  Initial Impression / Assessment and Plan / UC Course  I have reviewed the triage vital signs and the nursing notes.  Pertinent labs & imaging results that were available during my care of the patient were reviewed by me and considered in my medical decision making (see chart for details).     #Maxillary sinusitis #Cough Patient is a 54 year old presenting with maxillary sinusitis and likely a postnasal drip triggering cough.  We will treat as sinusitis.  Cough we treated symptomatically with suppressant.  We will test him for Covid at his request.  Discussed use of over-the-counter Debrox for earwax removal.  Encouraged him to follow-up with his primary care.  Discussed return, follow-up and emergency department precautions.  Patient verbalized understand plan of care Final Clinical Impressions(s) / UC Diagnoses   Final diagnoses:  Cough  Acute maxillary sinusitis, recurrence not specified     Discharge Instructions     Your chest xray was normal  Take the medications as prescribed  Follow up with your primary care next week  If severe symptoms of shortness of breath, chest pain or other concerning symptoms go to the Emergency department  If your Covid-19 test is positive, you will receive a phone call from [facility] regarding your results. Negative test results are not called. Both positive and negative results area always visible on MyChart. If you do not have a MyChart account, sign up instructions are in your discharge papers.   Persons who are directed to care for themselves at home may discontinue isolation under the following conditions:   At least 10  days have passed since symptom onset and  At least 24 hours have passed without running a fever (this means without the use of fever-reducing medications) and  Other symptoms have improved.  Persons infected with COVID-19 who never develop symptoms may discontinue isolation and other precautions 10 days after the date of their first positive COVID-19 test.      ED Prescriptions    Medication Sig Dispense Auth. Provider   amoxicillin-clavulanate (AUGMENTIN) 875-125 MG tablet Take 1 tablet by mouth 2 (two) times daily for 10 days. 20 tablet Casmer Yepiz, Veryl Speak, PA-C   fluticasone (FLONASE) 50 MCG/ACT nasal spray Place 1 spray into both nostrils daily. 15.8 mL Johntavious Francom, Veryl Speak, PA-C   benzonatate (TESSALON) 100 MG capsule Take 1 capsule (100 mg total) by mouth every 8 (eight) hours. 21 capsule Ishana Blades, Veryl Speak, PA-C     PDMP not reviewed this encounter.   Hermelinda Medicus, PA-C 06/01/20 2340

## 2020-06-01 NOTE — Discharge Instructions (Addendum)
Your chest xray was normal  Take the medications as prescribed  Follow up with your primary care next week  If severe symptoms of shortness of breath, chest pain or other concerning symptoms go to the Emergency department  If your Covid-19 test is positive, you will receive a phone call from [facility] regarding your results. Negative test results are not called. Both positive and negative results area always visible on MyChart. If you do not have a MyChart account, sign up instructions are in your discharge papers.   Persons who are directed to care for themselves at home may discontinue isolation under the following conditions:   At least 10 days have passed since symptom onset and  At least 24 hours have passed without running a fever (this means without the use of fever-reducing medications) and  Other symptoms have improved.  Persons infected with COVID-19 who never develop symptoms may discontinue isolation and other precautions 10 days after the date of their first positive COVID-19 test.

## 2020-06-02 LAB — SARS CORONAVIRUS 2 (TAT 6-24 HRS): SARS Coronavirus 2: NEGATIVE

## 2020-06-08 ENCOUNTER — Ambulatory Visit

## 2020-07-06 ENCOUNTER — Encounter (HOSPITAL_COMMUNITY): Payer: Self-pay | Admitting: Emergency Medicine

## 2020-07-06 ENCOUNTER — Other Ambulatory Visit: Payer: Self-pay

## 2020-07-06 ENCOUNTER — Ambulatory Visit (HOSPITAL_COMMUNITY)
Admission: EM | Admit: 2020-07-06 | Discharge: 2020-07-06 | Disposition: A | Discharge status: Home / Self Care | Attending: Family Medicine | Admitting: Family Medicine

## 2020-07-06 DIAGNOSIS — Z20822 Contact with and (suspected) exposure to covid-19: Secondary | ICD-10-CM | POA: Diagnosis present

## 2020-07-06 DIAGNOSIS — H60502 Unspecified acute noninfective otitis externa, left ear: Secondary | ICD-10-CM | POA: Insufficient documentation

## 2020-07-06 DIAGNOSIS — H6122 Impacted cerumen, left ear: Secondary | ICD-10-CM | POA: Diagnosis not present

## 2020-07-06 MED ORDER — NEOMYCIN-POLYMYXIN-HC 3.5-10000-1 OT SUSP: 4.0000 [drp] | Freq: Three times a day (TID) | OTIC | 0 refills | Status: DC

## 2020-07-06 NOTE — Discharge Instructions (Signed)
Use eardrops for the next 3 to 5 days, until the ear discomfort improves Return as needed  You can check for your Covid test results on MyChart

## 2020-07-06 NOTE — ED Provider Notes (Signed)
[facility]    CSN: [id number] Arrival date & time: 07/06/20  1551      History   Chief Complaint Chief Complaint  Patient presents with  . Otalgia   A lot of her problem is probably HPI Alejandro Beltran is a 54 y.o. male.   HPI Patient was here previously for an upper respiratory infection was found to have cerumen impaction left ear.  He states that it is still bothering him.  He states the ear feels swollen.  The hearing is diminished.  He is having ear pain. He also requests a Covid test.  He is flying out of town and feels like he may need for travel.  He is not having Covid symptoms.  He has had Covid vaccinations Past Medical History:  Diagnosis Date  . Acute medial meniscal tear    left  . Acute meniscal tear of knee   . Allergy   . Glaucoma   . Peanut allergy     Patient Active Problem List   Diagnosis Date Noted  . Left shoulder pain 12/13/2017  . Pre-diabetes 01/06/2017  . Snoring 04/02/2016  . Osteoarthritis of left knee 12/22/2014  . Peanut allergy   . Acute meniscal tear of knee   . Glaucoma   . Acute medial meniscal tear   . Right eye injury 11/11/2012    Past Surgical History:  Procedure Laterality Date  . EYE SURGERY     rt retina sx  . MENISCUS REPAIR  08/2015       Home Medications    Prior to Admission medications   Medication Sig Start Date End Date Taking? Authorizing Provider  brimonidine-timolol (COMBIGAN) 0.2-0.5 % ophthalmic solution Place 1 drop into both eyes every 12 (twelve) hours.    [provider]  EPINEPHrine (EPIPEN 2-PAK) 0.3 mg/0.3 mL IJ SOAJ injection Inject 0.3 mLs (0.3 mg total) into the muscle once as needed. 05/14/17   Copland, Gwenlyn Found, MD  ibuprofen (ADVIL) 800 MG tablet  05/16/19   [provider]  Multiple Vitamin (MULTIVITAMIN) tablet Take 1 tablet by mouth daily.    [provider]  neomycin-polymyxin-hydrocortisone (CORTISPORIN) 3.5-10000-1 OTIC suspension Place 4 drops  into the left ear 3 (three) times daily. 07/06/20   Eustace Moore, MD  sildenafil (VIAGRA) 100 MG tablet TAKE ONE-HALF (1/2) TO ONE TABLET DAILY AS NEEDED FOR ERECTILE DYSFUNCTION 08/22/19   Copland, Gwenlyn Found, MD  fluticasone (FLONASE) 50 MCG/ACT nasal spray Place 1 spray into both nostrils daily. 06/01/20 07/06/20  Darr, Veryl Speak, PA-C    Family History Family History  Problem Relation Age of Onset  . Hypertension Mother   . Anuerysm Father   . Diabetes Maternal Grandmother   . Stroke Maternal Grandmother   . Cancer Maternal Grandfather   . Colon cancer Neg Hx     Social History Social History   Tobacco Use  . Smoking status: Passive Smoke Exposure - Never Smoker  . Smokeless tobacco: Never Used  Vaping Use  . Vaping Use: Never used  Substance Use Topics  . Alcohol use: Yes    Alcohol/week: 2.0 standard drinks    Types: 2 Standard drinks or equivalent per week    Comment: socially  . Drug use: No     Allergies   Other, Peanuts [peanut oil], Pecan extract allergy skin test, and Pistachio nut extract skin test   Review of Systems Review of Systems  See HPI Physical Exam Triage Vital Signs ED Triage  Vitals  Enc Vitals Group     BP 07/06/20 1727 (!) 135/95     Pulse Rate 07/06/20 1727 78     Resp 07/06/20 1727 16     Temp 07/06/20 1727 98.1 F (36.7 C)     Temp Source 07/06/20 1727 Oral     SpO2 07/06/20 1727 100 %     Weight --      Height --      Head Circumference --      Peak Flow --      Pain Score 07/06/20 1726 6     Pain Loc --      Pain Edu? --      Excl. in GC? --    No data found.  Updated Vital Signs BP (!) 135/95 (BP Location: Left Arm)   Pulse 78   Temp 98.1 F (36.7 C) (Oral)   Resp 16   SpO2 100%      Physical Exam Constitutional:      General: He is not in acute distress.    Appearance: He is well-developed.  HENT:     Head: Normocephalic and atraumatic.     Right Ear: Tympanic membrane, ear canal and external ear normal.       Left Ear: There is impacted cerumen.     Ears:     Comments: Left canal swollen.  Mild pain with pressure on tragus.  No discharge seen.  Wax still occludes TM Eyes:     Conjunctiva/sclera: Conjunctivae normal.     Pupils: Pupils are equal, round, and reactive to light.     Comments: Esotropia right  Cardiovascular:     Rate and Rhythm: Normal rate.  Pulmonary:     Effort: Pulmonary effort is normal. No respiratory distress.  Abdominal:     Palpations: Abdomen is soft.  Musculoskeletal:        General: Normal range of motion.     Cervical back: Normal range of motion.  Skin:    General: Skin is warm and dry.  Neurological:     Mental Status: He is alert.  Psychiatric:        Behavior: Behavior normal.      UC Treatments / Results  Labs (all labs ordered are listed, but only abnormal results are displayed) Labs Reviewed  SARS CORONAVIRUS 2 (TAT 6-24 HRS)    EKG   Radiology No results found.  Procedures Procedures (including critical care time)  Medications Ordered in UC Medications - No data to display  Initial Impression / Assessment and Plan / UC Course  I have reviewed the triage vital signs and the nursing notes.  Pertinent labs & imaging results that were available during my care of the patient were reviewed by me and considered in my medical decision making (see chart for details).     *Though infectious work-up really anxious anxious is for is anxious but she is probably just very sensitive and worries a lot does not make you that person but sometimes it can get in the way Final Clinical Impressions(s) / UC Diagnoses   Final diagnoses:  Impacted cerumen of left ear  Acute otitis externa of left ear, unspecified type  Encounter for laboratory testing for COVID-19 virus     Discharge Instructions     Use eardrops for the next 3 to 5 days, until the ear discomfort improves Return as needed  You can check for your Covid test results on  MyChart    ED Prescriptions  Medication Sig Dispense Auth. Provider   neomycin-polymyxin-hydrocortisone (CORTISPORIN) 3.5-10000-1 OTIC suspension Place 4 drops into the left ear 3 (three) times daily. 10 mL Eustace Moore, MD     PDMP not reviewed this encounter.   Eustace Moore, MD 07/06/20 [phone]

## 2020-07-06 NOTE — ED Triage Notes (Signed)
Pt presents with left ear pain. States was seen on 06/01/20 for the same problem but not getting better.    Pt requesting COVID test for travel. Denies symptoms at this time.

## 2020-07-07 LAB — SARS CORONAVIRUS 2 (TAT 6-24 HRS): SARS Coronavirus 2: POSITIVE — AB
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
## 2020-07-18 ENCOUNTER — Ambulatory Visit (INDEPENDENT_AMBULATORY_CARE_PROVIDER_SITE_OTHER): Admitting: Family Medicine

## 2020-07-18 ENCOUNTER — Other Ambulatory Visit: Payer: Self-pay

## 2020-07-18 DIAGNOSIS — R7303 Prediabetes: Secondary | ICD-10-CM

## 2020-07-18 DIAGNOSIS — Z130 Encounter for screening for diseases of the blood and blood-forming organs and certain disorders involving the immune mechanism: Secondary | ICD-10-CM

## 2020-07-18 DIAGNOSIS — Z125 Encounter for screening for malignant neoplasm of prostate: Secondary | ICD-10-CM

## 2020-07-18 DIAGNOSIS — Z538 Procedure and treatment not carried out for other reasons: Secondary | ICD-10-CM

## 2020-07-18 DIAGNOSIS — Z13220 Encounter for screening for lipoid disorders: Secondary | ICD-10-CM

## 2020-07-18 DIAGNOSIS — Z0000 Encounter for general adult medical examination without abnormal findings: Secondary | ICD-10-CM
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
## 2020-08-01 ENCOUNTER — Ambulatory Visit (INDEPENDENT_AMBULATORY_CARE_PROVIDER_SITE_OTHER): Admitting: Family Medicine

## 2020-08-01 ENCOUNTER — Other Ambulatory Visit: Payer: Self-pay

## 2020-08-01 ENCOUNTER — Encounter: Payer: Self-pay | Admitting: Family Medicine

## 2020-08-01 VITALS — BP 128/80 | HR 77 | Resp 16 | Ht 68.0 in | Wt 160.0 lb

## 2020-08-01 DIAGNOSIS — Z125 Encounter for screening for malignant neoplasm of prostate: Secondary | ICD-10-CM

## 2020-08-01 DIAGNOSIS — Z23 Encounter for immunization: Secondary | ICD-10-CM | POA: Diagnosis not present

## 2020-08-01 DIAGNOSIS — Z130 Encounter for screening for diseases of the blood and blood-forming organs and certain disorders involving the immune mechanism: Secondary | ICD-10-CM | POA: Diagnosis not present

## 2020-08-01 DIAGNOSIS — Z113 Encounter for screening for infections with a predominantly sexual mode of transmission: Secondary | ICD-10-CM

## 2020-08-01 DIAGNOSIS — Z13220 Encounter for screening for lipoid disorders: Secondary | ICD-10-CM

## 2020-08-01 DIAGNOSIS — R7303 Prediabetes: Secondary | ICD-10-CM

## 2020-08-01 DIAGNOSIS — Z0000 Encounter for general adult medical examination without abnormal findings: Secondary | ICD-10-CM | POA: Diagnosis not present

## 2020-08-01 DIAGNOSIS — Z91018 Allergy to other foods: Secondary | ICD-10-CM

## 2020-08-01 DIAGNOSIS — M25562 Pain in left knee: Secondary | ICD-10-CM

## 2020-08-01 DIAGNOSIS — Z131 Encounter for screening for diabetes mellitus: Secondary | ICD-10-CM | POA: Diagnosis not present

## 2020-08-01 DIAGNOSIS — Z889 Allergy status to unspecified drugs, medicaments and biological substances status: Secondary | ICD-10-CM

## 2020-08-01 DIAGNOSIS — G8929 Other chronic pain: Secondary | ICD-10-CM

## 2020-08-01 MED ORDER — CELECOXIB 100 MG PO CAPS: 100.0000 mg | ORAL_CAPSULE | Freq: Two times a day (BID) | ORAL | 3 refills | Status: DC

## 2020-08-01 MED ORDER — EPINEPHRINE 0.3 MG/0.3ML IJ SOAJ: 0.3000 mg | Freq: Once | INTRAMUSCULAR | 2 refills | Status: AC | PRN

## 2020-08-01 NOTE — Patient Instructions (Signed)
It was great to see you again today, I will be in touch with your labs soon as possible  You got your second shingles vaccine and your flu vaccine today  I refilled your EpiPen, please be sure you have an up-to-date pen just in case of allergic reaction  We also prescribed Celebrex for you to try for your knee pain, okay to take up to twice a day as needed   Health Maintenance, Male Adopting a healthy lifestyle and getting preventive care are important in promoting health and wellness. Ask your health care provider about:  The right schedule for you to have regular tests and exams.  Things you can do on your own to prevent diseases and keep yourself healthy. What should I know about diet, weight, and exercise? Eat a healthy diet   Eat a diet that includes plenty of vegetables, fruits, low-fat dairy products, and lean protein.  Do not eat a lot of foods that are high in solid fats, added sugars, or sodium. Maintain a healthy weight Body mass index (BMI) is a measurement that can be used to identify possible weight problems. It estimates body fat based on height and weight. Your health care provider can help determine your BMI and help you achieve or maintain a healthy weight. Get regular exercise Get regular exercise. This is one of the most important things you can do for your health. Most adults should:  Exercise for at least 150 minutes each week. The exercise should increase your heart rate and make you sweat (moderate-intensity exercise).  Do strengthening exercises at least twice a week. This is in addition to the moderate-intensity exercise.  Spend less time sitting. Even light physical activity can be beneficial. Watch cholesterol and blood lipids Have your blood tested for lipids and cholesterol at 54 years of age, then have this test every 5 years. You may need to have your cholesterol levels checked more often if:  Your lipid or cholesterol levels are high.  You are  older than 54 years of age.  You are at high risk for heart disease. What should I know about cancer screening? Many types of cancers can be detected early and may often be prevented. Depending on your health history and family history, you may need to have cancer screening at various ages. This may include screening for:  Colorectal cancer.  Prostate cancer.  Skin cancer.  Lung cancer. What should I know about heart disease, diabetes, and high blood pressure? Blood pressure and heart disease  High blood pressure causes heart disease and increases the risk of stroke. This is more likely to develop in people who have high blood pressure readings, are of African descent, or are overweight.  Talk with your health care provider about your target blood pressure readings.  Have your blood pressure checked: ? Every 3-5 years if you are 54-26 years of age. ? Every year if you are 54 years old or older.  If you are between the ages of 50 and 18 and are a current or former smoker, ask your health care provider if you should have a one-time screening for abdominal aortic aneurysm (AAA). Diabetes Have regular diabetes screenings. This checks your fasting blood sugar level. Have the screening done:  Once every three years after age 18 if you are at a normal weight and have a low risk for diabetes.  More often and at a younger age if you are overweight or have a high risk for diabetes. What should  I know about preventing infection? Hepatitis B If you have a higher risk for hepatitis B, you should be screened for this virus. Talk with your health care provider to find out if you are at risk for hepatitis B infection. Hepatitis C Blood testing is recommended for:  Everyone born from [birth year] through 1965.  Anyone with known risk factors for hepatitis C. Sexually transmitted infections (STIs)  You should be screened each year for STIs, including gonorrhea and chlamydia, if: ? You are sexually  active and are younger than 54 years of age. ? You are older than 54 years of age. and your health care provider tells you that you are at risk for this type of infection. ? Your sexual activity has changed since you were last screened, and you are at increased risk for chlamydia or gonorrhea. Ask your health care provider if you are at risk.  Ask your health care provider about whether you are at high risk for HIV. Your health care provider may recommend a prescription medicine to help prevent HIV infection. If you choose to take medicine to prevent HIV, you should first get tested for HIV. You should then be tested every 3 months for as long as you are taking the medicine. Follow these instructions at home: Lifestyle  Do not use any products that contain nicotine or tobacco, such as cigarettes, e-cigarettes, and chewing tobacco. If you need help quitting, ask your health care provider.  Do not use street drugs.  Do not share needles.  Ask your health care provider for help if you need support or information about quitting drugs. Alcohol use  Do not drink alcohol if your health care provider tells you not to drink.  If you drink alcohol: ? Limit how much you have to 0-2 drinks a day. ? Be aware of how much alcohol is in your drink. In the [place] one drink equals one 12 oz bottle of beer (355 mL), one 5 oz glass of wine (148 mL), or one 1 oz glass of hard liquor (44 mL). General instructions  Schedule regular health, dental, and eye exams.  Stay current with your vaccines.  Tell your health care provider if: ? You often feel depressed. ? You have ever been abused or do not feel safe at home. Summary  Adopting a healthy lifestyle and getting preventive care are important in promoting health and wellness.  Follow your health care provider's instructions about healthy diet, exercising, and getting tested or screened for diseases.  Follow your health care provider's instructions on  monitoring your cholesterol and blood pressure. This information is not intended to replace advice given to you by your health care provider. Make sure you discuss any questions you have with your health care provider. Document Revised: 09/01/2018 Document Reviewed: 09/01/2018 Elsevier Patient Education  2020 [employer].
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
## 2020-08-02 ENCOUNTER — Encounter: Payer: Self-pay | Admitting: Family Medicine

## 2020-08-02 LAB — CBC
HCT: 44.2 % (ref 39.0–52.0)
Hemoglobin: 14.6 g/dL (ref 13.0–17.0)
MCHC: 32.9 g/dL (ref 30.0–36.0)
MCV: 90.9 fl (ref 78.0–100.0)
Platelets: 210 10*3/uL (ref 150.0–400.0)
RBC: 4.87 Mil/uL (ref 4.22–5.81)
RDW: 14.4 % (ref 11.5–15.5)
WBC: 7.6 10*3/uL (ref 4.0–10.5)

## 2020-08-02 LAB — LIPID PANEL
Cholesterol: 171 mg/dL (ref 0–200)
HDL: 51.1 mg/dL (ref >39.00)
LDL Cholesterol: 105 mg/dL — ABNORMAL HIGH (ref 0–99)
NonHDL: 119.9
Total CHOL/HDL Ratio: 3
Triglycerides: 77 mg/dL (ref 0.0–149.0)
VLDL: 15.4 mg/dL (ref 0.0–40.0)

## 2020-08-02 LAB — COMPREHENSIVE METABOLIC PANEL
ALT: 24 U/L (ref 0–53)
AST: 25 U/L (ref 0–37)
Albumin: 4.5 g/dL (ref 3.5–5.2)
Alkaline Phosphatase: 62 U/L (ref 39–117)
BUN: 23 mg/dL (ref 6–23)
CO2: 31 mEq/L (ref 19–32)
Calcium: 9.2 mg/dL (ref 8.4–10.5)
Chloride: 101 mEq/L (ref 96–112)
Creatinine, Ser: 1.36 mg/dL (ref 0.40–1.50)
GFR: 59.08 mL/min — ABNORMAL LOW (ref >60.00)
Glucose, Bld: 73 mg/dL (ref 70–99)
Potassium: 3.9 mEq/L (ref 3.5–5.1)
Sodium: 140 mEq/L (ref 135–145)
Total Bilirubin: 0.3 mg/dL (ref 0.2–1.2)
Total Protein: 6.7 g/dL (ref 6.0–8.3)

## 2020-08-02 LAB — PSA: PSA: 0.76 ng/mL (ref 0.10–4.00)

## 2020-08-02 LAB — RPR: RPR Ser Ql: NONREACTIVE

## 2020-08-02 LAB — C-REACTIVE PROTEIN: CRP: <1 mg/dL (ref 0.5–20.0)

## 2020-08-02 LAB — HEPATITIS B SURFACE ANTIGEN: Hepatitis B Surface Ag: NONREACTIVE

## 2020-08-02 LAB — HEPATITIS C ANTIBODY
Hepatitis C Ab: NONREACTIVE
SIGNAL TO CUT-OFF: 0.01 (ref <1.00)

## 2020-08-02 LAB — HEMOGLOBIN A1C: Hgb A1c MFr Bld: 5.9 % (ref 4.6–6.5)

## 2020-08-02 LAB — RHEUMATOID FACTOR: Rheumatoid fact SerPl-aCnc: <14 IU/mL (ref <14)

## 2020-08-02 LAB — HIV ANTIBODY (ROUTINE TESTING W REFLEX): HIV 1&2 Ab, 4th Generation: NONREACTIVE

## 2020-08-02 LAB — SEDIMENTATION RATE: Sed Rate: 9 mm/hr (ref 0–20)

## 2020-08-06 ENCOUNTER — Telehealth: Payer: Self-pay | Admitting: Family Medicine

## 2020-08-06 DIAGNOSIS — Z113 Encounter for screening for infections with a predominantly sexual mode of transmission: Secondary | ICD-10-CM

## 2020-08-06 DIAGNOSIS — G8929 Other chronic pain: Secondary | ICD-10-CM

## 2020-08-06 DIAGNOSIS — M2550 Pain in unspecified joint: Secondary | ICD-10-CM

## 2020-08-06 NOTE — Telephone Encounter (Signed)
Patient would like a call back to go over lab results.  

## 2020-08-07 ENCOUNTER — Other Ambulatory Visit (HOSPITAL_COMMUNITY)
Admission: RE | Admit: 2020-08-07 | Discharge: 2020-08-07 | Disposition: A | Discharge status: Home / Self Care | Source: Ambulatory Visit | Attending: Family Medicine | Admitting: Family Medicine

## 2020-08-07 DIAGNOSIS — Z113 Encounter for screening for infections with a predominantly sexual mode of transmission: Secondary | ICD-10-CM | POA: Insufficient documentation

## 2020-08-07 NOTE — Telephone Encounter (Signed)
Spoke with patient regarding his lab results. He did not see his mychart message yet. He states he would like rheumatology referral to be placed for him just in case they will in fact see him. He states he mentioned being checked for more common STD such as trich, chlamydia, gonorrhea, etc. I have scheduled him to come back in for lab tomorrow could you please place order?

## 2020-08-08 ENCOUNTER — Other Ambulatory Visit: Payer: Self-pay

## 2020-08-08 ENCOUNTER — Other Ambulatory Visit

## 2020-08-09 LAB — URINE CYTOLOGY ANCILLARY ONLY
Chlamydia: NEGATIVE
Comment: NEGATIVE
Comment: NEGATIVE
Comment: NORMAL
Neisseria Gonorrhea: NEGATIVE
Trichomonas: NEGATIVE

## 2020-08-10 ENCOUNTER — Encounter: Payer: Self-pay | Admitting: Family Medicine

## 2020-09-12 ENCOUNTER — Ambulatory Visit: Admitting: Internal Medicine

## 2020-09-12 NOTE — Progress Notes (Deleted)
Office Visit Note  Patient: Alejandro Beltran             Date of Birth: [birth year]           MRN: [id number]             PCP: Pearline Cables, MD Referring: Pearline Cables, MD Visit Date: 09/12/2020 Occupation: @GUAROCC @  Subjective:  No chief complaint on file.   History of Present Illness: Alejandro Beltran is a 54 y.o. male ***   Activities of Daily Living:  Patient reports morning stiffness for *** {minute/hour:19697}.   Patient {ACTIONS;DENIES/REPORTS:21021675::"Denies"} nocturnal pain.  Difficulty dressing/grooming: {ACTIONS;DENIES/REPORTS:21021675::"Denies"} Difficulty climbing stairs: {ACTIONS;DENIES/REPORTS:21021675::"Denies"} Difficulty getting out of chair: {ACTIONS;DENIES/REPORTS:21021675::"Denies"} Difficulty using hands for taps, buttons, cutlery, and/or writing: {ACTIONS;DENIES/REPORTS:21021675::"Denies"}  No Rheumatology ROS completed.   PMFS History:  Patient Active Problem List   Diagnosis Date Noted  . Left shoulder pain 12/13/2017  . Pre-diabetes 01/06/2017  . Snoring 04/02/2016  . Osteoarthritis of left knee 12/22/2014  . Peanut allergy   . Acute meniscal tear of knee   . Glaucoma   . Acute medial meniscal tear   . Right eye injury 11/11/2012    Past Medical History:  Diagnosis Date  . Acute medial meniscal tear    left  . Acute meniscal tear of knee   . Allergy   . Glaucoma   . Peanut allergy     Family History  Problem Relation Age of Onset  . Hypertension Mother   . Anuerysm Father   . Diabetes Maternal Grandmother   . Stroke Maternal Grandmother   . Cancer Maternal Grandfather   . Colon cancer Neg Hx    Past Surgical History:  Procedure Laterality Date  . EYE SURGERY     rt retina sx  . MENISCUS REPAIR  08/2015   Social History   Social History Narrative  . Not on file   Immunization History  Administered Date(s) Administered  . Influenza,inj,Quad PF,6+ Mos 12/05/2015, 06/23/2016, 05/18/2019, 08/01/2020  . PFIZER  SARS-COV-2 Vaccination 11/26/2019, 12/17/2019  . Tdap 06/28/2012  . Zoster Recombinat (Shingrix) 05/18/2019, 08/01/2020     Objective: Vital Signs: There were no vitals taken for this visit.   Physical Exam   Musculoskeletal Exam: ***  CDAI Exam: CDAI Score: -- Patient Global: --; Provider Global: -- Swollen: --; Tender: -- Joint Exam 09/12/2020   No joint exam has been documented for this visit   There is currently no information documented on the homunculus. Go to the Rheumatology activity and complete the homunculus joint exam.  Investigation: No additional findings.  Imaging: No results found.  Recent Labs: Lab Results  Component Value Date   WBC 7.6 08/01/2020   HGB 14.6 08/01/2020   PLT 210.0 08/01/2020   NA 140 08/01/2020   K 3.9 08/01/2020   CL 101 08/01/2020   CO2 31 08/01/2020   GLUCOSE 73 08/01/2020   BUN 23 08/01/2020   CREATININE 1.36 08/01/2020   BILITOT 0.3 08/01/2020   ALKPHOS 62 08/01/2020   AST 25 08/01/2020   ALT 24 08/01/2020   PROT 6.7 08/01/2020   ALBUMIN 4.5 08/01/2020   CALCIUM 9.2 08/01/2020    Speciality Comments: No specialty comments available.  Procedures:  No procedures performed Allergies: Other, Peanuts [peanut oil], Pecan extract allergy skin test, and Pistachio nut extract skin test   Assessment / Plan:     Visit Diagnoses: No diagnosis found.  Orders: No orders of the defined types were placed in this  encounter.  No orders of the defined types were placed in this encounter.   Face-to-face time spent with patient was *** minutes. Greater than 50% of time was spent in counseling and coordination of care.  Follow-Up Instructions: No follow-ups on file.   Fuller Plan, MD  Note - This record has been created using [employer].  Chart creation errors have been sought, but may not always  have been located. Such creation errors do not reflect on  the standard of medical care.

## 2020-10-13 ENCOUNTER — Other Ambulatory Visit: Payer: Self-pay | Admitting: Family Medicine

## 2020-11-09 ENCOUNTER — Other Ambulatory Visit: Payer: Self-pay | Admitting: Family Medicine

## 2020-11-09 DIAGNOSIS — N528 Other male erectile dysfunction: Secondary | ICD-10-CM

## 2020-11-09 NOTE — Telephone Encounter (Signed)
Which pharmacy?

## 2020-11-09 NOTE — Telephone Encounter (Signed)
Pt is requesting a prescription for Sildenafil (Viagra 100 MG)

## 2020-11-22 MED ORDER — TADALAFIL 20 MG PO TABS: ORAL_TABLET | ORAL | 6 refills | Status: DC

## 2020-11-22 NOTE — Telephone Encounter (Signed)
sildenafil (VIAGRA) 100 MG tablet [Pharmacy Med Name: SILDENAFIL TABS 100MG ]     [facility] - [[id number]], [place] - [address]  [address] [place]  Phone:  [phone] Fax:  [phone]

## 2020-11-22 NOTE — Telephone Encounter (Signed)
Please advise if refill is appropriate. Medication not currently on medication list.

## 2020-12-05 ENCOUNTER — Encounter: Payer: Self-pay | Admitting: Family Medicine

## 2020-12-05 MED ORDER — SILDENAFIL CITRATE 100 MG PO TABS
ORAL_TABLET | ORAL | 3 refills | Status: DC
Start: 2020-12-05 — End: 2023-05-07
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
## 2021-02-04 ENCOUNTER — Ambulatory Visit: Admitting: Family Medicine
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
## 2021-02-21 ENCOUNTER — Ambulatory Visit: Admitting: Family Medicine

## 2021-02-21 DIAGNOSIS — R944 Abnormal results of kidney function studies: Secondary | ICD-10-CM
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
## 2021-03-06 ENCOUNTER — Ambulatory Visit: Admitting: Family Medicine
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
## 2021-03-07 ENCOUNTER — Encounter: Payer: Self-pay | Admitting: Family Medicine

## 2021-03-07 ENCOUNTER — Ambulatory Visit: Admitting: Family Medicine
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
## 2021-03-13 ENCOUNTER — Ambulatory Visit: Admitting: Family Medicine

## 2021-03-14 ENCOUNTER — Encounter: Payer: Self-pay | Admitting: Family Medicine

## 2023-02-18 ENCOUNTER — Encounter: Payer: Self-pay | Admitting: Gastroenterology

## 2023-05-05 ENCOUNTER — Ambulatory Visit (HOSPITAL_BASED_OUTPATIENT_CLINIC_OR_DEPARTMENT_OTHER): Admitting: Family Medicine

## 2023-05-07 ENCOUNTER — Encounter: Payer: Self-pay | Admitting: Neurology

## 2023-05-07 ENCOUNTER — Ambulatory Visit (HOSPITAL_BASED_OUTPATIENT_CLINIC_OR_DEPARTMENT_OTHER): Admitting: Family Medicine

## 2023-05-07 ENCOUNTER — Ambulatory Visit (INDEPENDENT_AMBULATORY_CARE_PROVIDER_SITE_OTHER)

## 2023-05-07 ENCOUNTER — Encounter (HOSPITAL_BASED_OUTPATIENT_CLINIC_OR_DEPARTMENT_OTHER): Payer: Self-pay | Admitting: Family Medicine

## 2023-05-07 VITALS — BP 131/87 | HR 59 | Ht 68.0 in | Wt 157.0 lb

## 2023-05-07 DIAGNOSIS — N529 Male erectile dysfunction, unspecified: Secondary | ICD-10-CM

## 2023-05-07 DIAGNOSIS — M545 Low back pain, unspecified: Secondary | ICD-10-CM | POA: Diagnosis not present

## 2023-05-07 DIAGNOSIS — N528 Other male erectile dysfunction: Secondary | ICD-10-CM | POA: Diagnosis not present

## 2023-05-07 DIAGNOSIS — G8929 Other chronic pain: Secondary | ICD-10-CM | POA: Diagnosis not present

## 2023-05-07 DIAGNOSIS — M79673 Pain in unspecified foot: Secondary | ICD-10-CM | POA: Diagnosis not present

## 2023-05-07 DIAGNOSIS — R519 Headache, unspecified: Secondary | ICD-10-CM

## 2023-05-07 DIAGNOSIS — Z0000 Encounter for general adult medical examination without abnormal findings: Secondary | ICD-10-CM

## 2023-05-07 MED ORDER — TADALAFIL 20 MG PO TABS
ORAL_TABLET | ORAL | 3 refills | Status: DC
Start: 2023-05-07 — End: 2023-07-16

## 2023-05-07 NOTE — Assessment & Plan Note (Signed)
No red flags on history or exam today. Given chronicity, can proceed with x-ray imaging today for further evaluation Would recommend initiating physical therapy, patient in agreement, referral placed today.  Can continue with OTC medications to help with pain control. We discussed possible evaluation with spine specialist, patient would like to have referral placed to meet with specialist, referral placed today

## 2023-05-07 NOTE — Progress Notes (Signed)
New Patient Office Visit  Subjective    Patient ID: Alejandro Beltran, male    DOB: Nov 03, 1965  Age: 57 y.o. MRN: [id number]  CC:  Chief Complaint  Patient presents with   Back Pain    Ongoing for a few years, is getting gradually worse. Military injury years ago, flaring up    Headache    1-2 times a week, noise and light triggers it. Has started getting more frequent    HPI Alejandro Beltran presents to establish care Last PCP - Dr. Patsy Lager  Back pain: present for many years. Thinks related to [employer] injury. Reports prior evaluation many years ago with specialist. Had imaging, thinks was told there was arthritis. Bilateral lower back, some radiation into left buttock, no pain into legs. No associated numbness.  Headaches: new issue for patient, denies history of headaches. Reports having 1-2 episodes per week. Has tried tylenol, removing noxious stimuli. Symptoms worsened by light/noise. These interventions typically will help. Last about 30-45 minutes.  Foot pain: requesting referral to podiatry related to this. Has been dealing with for many years. Thinks related to flat feet.  ED: reports being prescribed tadalafil and sildenafil in the past, prefers tadalafil, requesting refill today.   Patient is originally from [place], [place]. 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 Outside of work, he enjoys reading. Used to exercise, limited due to above issues.  Outpatient Encounter Medications as of 05/07/2023  Medication Sig   brimonidine-timolol (COMBIGAN) 0.2-0.5 % ophthalmic solution Place 1 drop into both eyes every 12 (twelve) hours.   celecoxib (CELEBREX) 100 MG capsule Take 1 capsule (100 mg total) by mouth 2 (two) times daily. Use as needed for knee pain   EPINEPHrine (EPIPEN 2-PAK) 0.3 mg/0.3 mL IJ SOAJ injection Inject 0.3 mg into the muscle once as needed for up to 1 dose.   meloxicam (MOBIC) 15 MG tablet TAKE 1 TABLET BY MOUTH ONCE DAILY AS NEEDED FOR PAIN   Multiple Vitamin (MULTIVITAMIN)  tablet Take 1 tablet by mouth daily.   [DISCONTINUED] sildenafil (VIAGRA) 100 MG tablet TAKE ONE-HALF (1/2) TO ONE TABLET DAILY AS NEEDED FOR ERECTILE DYSFUNCTION   [DISCONTINUED] tadalafil (CIALIS) 20 MG tablet Take 1/2 or 1 as needed for ED.  Max 20mg  in 24 hours   tadalafil (CIALIS) 20 MG tablet Take 1/2 or 1 as needed for ED.  Max 20mg  in 24 hours   [DISCONTINUED] fluticasone (FLONASE) 50 MCG/ACT nasal spray Place 1 spray into both nostrils daily.   No facility-administered encounter medications on file as of 05/07/2023.    Past Medical History:  Diagnosis Date   Acute medial meniscal tear    left   Acute meniscal tear of knee    Allergy    Glaucoma    Peanut allergy     Past Surgical History:  Procedure Laterality Date   EYE SURGERY     rt retina sx   MENISCUS REPAIR  08/2015    Family History  Problem Relation Age of Onset   Hypertension Mother    Anuerysm Father    Diabetes Maternal Grandmother    Stroke Maternal Grandmother    Cancer Maternal Grandfather    Colon cancer Neg Hx     Social History   Socioeconomic History   Marital status: Married    Spouse name: Not on file   Number of children: Not on file   Years of education: Not on file   Highest education level: Not on file  Occupational History  Occupation: Group Optometrist: [employer]  Tobacco Use   Smoking status: Passive Smoke Exposure - Never Smoker   Smokeless tobacco: Never  Vaping Use   Vaping status: Never Used  Substance and Sexual Activity   Alcohol use: Yes    Alcohol/week: 2.0 standard drinks of alcohol    Types: 2 Standard drinks or equivalent per week    Comment: socially   Drug use: No   Sexual activity: Yes    Partners: Female    Birth control/protection: Condom  Other Topics Concern   Not on file  Social History Narrative   Not on file   Social Determinants of Health   Financial Resource Strain: Not on file  Food Insecurity: Not on file   Transportation Needs: Not on file  Physical Activity: Not on file  Stress: Not on file  Social Connections: Not on file  Intimate Partner Violence: Not on file    Objective    BP 131/87   Pulse (!) 59   Ht 5\' 8"  (1.727 m)   Wt 157 lb (71.2 kg)   SpO2 99%   BMI 23.87 kg/m   Physical Exam  57 year old male in no acute distress Cardiovascular exam regular rate and rhythm Lungs clear to auscultation bilaterally Lumbar spine: Visual inspection without obvious abnormality No tenderness to palpation over spinous processes, mild tenderness to palpation through paraspinal musculature bilaterally.  Straight leg test with pain in low back, however no radiation of pain down either lower extremity  Assessment & Plan:   Chronic bilateral low back pain, unspecified whether sciatica present Assessment & Plan: No red flags on history or exam today. Given chronicity, can proceed with x-ray imaging today for further evaluation Would recommend initiating physical therapy, patient in agreement, referral placed today.  Can continue with OTC medications to help with pain control. We discussed possible evaluation with spine specialist, patient would like to have referral placed to meet with specialist, referral placed today  Orders: -     Ambulatory referral to Orthopedic Surgery -     DG Lumbar Spine Complete; Future -     Ambulatory referral to Physical Therapy  Pain of foot, unspecified laterality Assessment & Plan: Can proceed with referral to podiatry for further evaluation and recommendations  Orders: -     Ambulatory referral to Podiatry  Erectile dysfunction, unspecified erectile dysfunction type Assessment & Plan: Has had previous satisfactory control of symptoms with use of tadalafil as needed.  Can proceed with this, refill sent to pharmacy  Orders: -     Tadalafil; Take 1/2 or 1 as needed for ED.  Max 20mg  in 24 hours  Dispense: 10 tablet; Refill: 3  Other male erectile  dysfunction Assessment & Plan: Has had previous satisfactory control of symptoms with use of tadalafil as needed.  Can proceed with this, refill sent to pharmacy   Nonintractable episodic headache, unspecified headache type Assessment & Plan: New issue for patient, has not had history of headaches typically in the past.  Symptoms have responded well to use of OTC analgesic. Given new onset of headaches with lack of prior history and patient age, feel that further evaluation with specialist would be recommended, patient in agreement, referral to neurology placed  Orders: -     Ambulatory referral to Neurology  Wellness examination -     CBC with Differential/Platelet; Future -     Comprehensive metabolic panel; Future -     Hemoglobin A1c; Future -  Lipid panel; Future -     TSH Rfx on Abnormal to Free T4; Future  Return in about 3 months (around 08/07/2023) for CPE with fasting labs 1 week prior.    ___________________________________________  [place], MD, ABFM, CAQSM Primary Care and Sports Medicine [facility]

## 2023-05-07 NOTE — Addendum Note (Signed)
Addended by: Katharine Look on: 05/07/2023 02:25 PM   Modules accepted: Orders

## 2023-05-07 NOTE — Assessment & Plan Note (Signed)
New issue for patient, has not had history of headaches typically in the past.  Symptoms have responded well to use of OTC analgesic. Given new onset of headaches with lack of prior history and patient age, feel that further evaluation with specialist would be recommended, patient in agreement, referral to neurology placed

## 2023-05-07 NOTE — Assessment & Plan Note (Signed)
Can proceed with referral to podiatry for further evaluation and recommendations

## 2023-05-07 NOTE — Assessment & Plan Note (Signed)
Has had previous satisfactory control of symptoms with use of tadalafil as needed.  Can proceed with this, refill sent to pharmacy

## 2023-05-18 ENCOUNTER — Ambulatory Visit: Admitting: Podiatry

## 2023-05-19 ENCOUNTER — Encounter: Payer: Self-pay | Admitting: Podiatry

## 2023-05-19 ENCOUNTER — Ambulatory Visit: Admitting: Podiatry

## 2023-05-19 ENCOUNTER — Ambulatory Visit (INDEPENDENT_AMBULATORY_CARE_PROVIDER_SITE_OTHER)

## 2023-05-19 DIAGNOSIS — M778 Other enthesopathies, not elsewhere classified: Secondary | ICD-10-CM

## 2023-05-19 DIAGNOSIS — M21619 Bunion of unspecified foot: Secondary | ICD-10-CM

## 2023-05-19 DIAGNOSIS — B351 Tinea unguium: Secondary | ICD-10-CM

## 2023-05-19 NOTE — Patient Instructions (Signed)
The surgery we discussed for your bunion is called a LAPIDUS BUNIONECTOMY   --  Bunion A bunion (hallux valgus) is a bump that forms slowly on the inner side of the big toe joint. It occurs when the big toe turns toward the second toe. Bunions may be small at first, but they often get larger over time. They can make walking painful. What are the causes? This condition may be caused by: Wearing narrow or pointed shoes that force the big toe to press against the other toes. Abnormal foot development that causes the foot to roll inward. Changes in the foot that are caused by certain diseases, such as rheumatoid arthritis or polio. A foot injury. What increases the risk? The following factors may make you more likely to develop this condition: Wearing shoes that squeeze the toes together. Having certain diseases, such as: Rheumatoid arthritis. Polio. Cerebral palsy. Having family members who have bunions. Being born with abnormally shaped feet (a foot deformity), such as flat feet or low arches. Doing activities that put a lot of pressure on the feet, such as ballet dancing. What are the signs or symptoms?  The main symptom of this condition is a bump on your big toe that you can notice. Other symptoms may include: Pain. Redness and inflammation around your big toe. Thick or hardened skin on your big toe or between your toes. Stiffness or loss of motion in your big toe. Trouble with walking. How is this diagnosed? This condition may be diagnosed based on your symptoms, medical history, and activities. You may also have tests and imaging, such as: X-rays. These allow your health care provider to check the position of the bones in your foot and look for damage to your joint. They also help your health care provider determine the severity of your bunion and the best way to treat it. Joint aspiration. In this test, a sample of fluid is removed from the toe joint. This test may be done if  you are in a lot of pain. It helps rule out diseases that cause painful swelling of the joints, such as arthritis or gout. How is this treated? Treatment depends on the severity of your symptoms. The goal of treatment is to relieve symptoms and prevent your bunion from getting worse. Your health care provider may recommend: Wearing shoes that have a wide toe box, or using bunion pads to cushion the affected area. Taping your toes together to keep them in a normal position. Placing a device inside your shoe (orthotic device) to help reduce pressure on your toe joint. Taking medicine to ease pain and inflammation. Putting ice or heat on the affected area. Doing stretching exercises. Surgery, for severe cases. Follow these instructions at home: Managing pain, stiffness, and swelling     If directed, put ice on the painful area. To do this: Put ice in a plastic bag. Place a towel between your skin and the bag. Leave the ice on for 20 minutes, 2-3 times a day. Remove the ice if your skin turns bright red. This is very important. If you cannot feel pain, heat, or cold, you have a greater risk of damage to the area. If directed, apply heat to the affected area before you exercise. Use the heat source that your health care provider recommends, such as a moist heat pack or a heating pad. Place a towel between your skin and the heat source. Leave the heat on for 20-30 minutes. Remove the heat if your  skin turns bright red. This is especially important if you are unable to feel pain, heat, or cold. You have a greater risk of getting burned. General instructions Do exercises as told by your health care provider. Support your toe joint with proper footwear, shoe padding, or taping as told by your health care provider. Take over-the-counter and prescription medicines only as told by your health care provider. Do not use any products that contain nicotine or tobacco, such as cigarettes, e-cigarettes,  and chewing tobacco. If you need help quitting, ask your health care provider. Keep all follow-up visits. This is important. Contact a health care provider if: Your symptoms get worse. Your symptoms do not improve in 2 weeks. Get help right away if: You have severe pain and trouble with walking. Summary A bunion is a bump on the inner side of the big toe joint that forms when the big toe turns toward the second toe. Bunions can make walking painful. Treatment depends on the severity of your symptoms. Support your toe joint with proper footwear, shoe padding, or taping as told by your health care provider. This information is not intended to replace advice given to you by your health care provider. Make sure you discuss any questions you have with your health care provider. Document Revised: 01/12/2020 Document Reviewed: 01/13/2020 Elsevier Patient Education  2024 [employer].

## 2023-05-19 NOTE — Progress Notes (Unsigned)
Subjective:   Patient ID: Alejandro Beltran, male   DOB: 57 y.o.   MRN: [id number]   HPI Chief Complaint  Patient presents with   Foot Pain    RM12: patient is here for left foot pain for years    Left is worse than the right. It hurts along the bunion   Hisotyr of pain with flatfoot and pain in the ball of the foot  Toenails are getting dark and tichker - no pain to the nails  (Darker in 3/4/5 b/l)    ROS      Objective:  Physical Exam  ***     Assessment:  ***     Plan:  ***

## 2023-06-03 ENCOUNTER — Other Ambulatory Visit (INDEPENDENT_AMBULATORY_CARE_PROVIDER_SITE_OTHER)

## 2023-06-03 ENCOUNTER — Ambulatory Visit (INDEPENDENT_AMBULATORY_CARE_PROVIDER_SITE_OTHER): Admitting: Orthopedic Surgery

## 2023-06-03 ENCOUNTER — Encounter: Payer: Self-pay | Admitting: Orthopedic Surgery

## 2023-06-03 VITALS — BP 132/89 | HR 67 | Ht 68.0 in | Wt 157.0 lb

## 2023-06-03 DIAGNOSIS — G8929 Other chronic pain: Secondary | ICD-10-CM

## 2023-06-03 DIAGNOSIS — M545 Low back pain, unspecified: Secondary | ICD-10-CM

## 2023-06-03 MED ORDER — PREGABALIN 75 MG PO CAPS: 75.0000 mg | ORAL_CAPSULE | Freq: Two times a day (BID) | ORAL | 0 refills | Status: DC

## 2023-06-03 NOTE — Progress Notes (Signed)
Orthopedic Spine Surgery Office Note  Assessment: Patient is a 57 y.o. male with low back pain that radiates into the left buttock, posterior and lateral thigh -possible radiculopathy   Plan: -Explained that initially conservative treatment is tried as a significant number of patients may experience relief with these treatment modalities. Discussed that the conservative treatments include:  -activity modification  -physical therapy  -over the counter pain medications  -medrol dosepak  -lumbar steroid injections -Patient has tried tylenol, aleve -Prescribed lyrica for pain relief.  Recommended core strengthening - home exercise program provided to him.  Can continue with Tylenol -If he is not doing any better at her next visit, will order MRI of the lumbar spine to evaluate for radiculopathy -Patient should return to office in 6 weeks, x-rays at next visit: None   Patient expressed understanding of the plan and all questions were answered to the patient's satisfaction.   ___________________________________________________________________________   History:  Patient is a 57 y.o. male who presents today for lumbar spine.  Patient has had several years of low back pain that radiates into the left buttock and left thigh.  He feels it along the posterior and lateral aspect of the thigh.  It does not radiate past the knee.  He is not having any pain radiating into the right lower extremity.  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  He states that after that injury he got better but then had noticed a similar pain within the last several years that has gotten progressively worse.  He feels the pain on a daily basis.   Weakness: Denies Symptoms of imbalance: Denies Paresthesias and numbness: He had numbness/paresthesias in the left proximal thigh but that is resolved.  No numbness or paresthesias currently Bowel or bladder incontinence: Denies Saddle anesthesia:  Denies  Treatments tried: Tylenol, Aleve  Review of systems: Denies fevers and chills, night sweats, unexplained weight loss, history of cancer.  Has had pain that wakes him at night  Past medical history: Chronic pain Glaucoma  Allergies: NKDA  Past surgical history:  Left TKA Left rotator cuff repair Right retina surgery  Social history: Denies use of nicotine product (smoking, vaping, patches, smokeless) Alcohol use: Yes, approximately 5-6 drinks per week Denies recreational drug use   Physical Exam:  BMI of 23.9  General: no acute distress, appears stated age Neurologic: alert, answering questions appropriately, following commands Respiratory: unlabored breathing on room air, symmetric chest rise Psychiatric: appropriate affect, normal cadence to speech   MSK (spine):  -Strength exam      Left  Right EHL    5/5  5/5 TA    5/5  5/5 GSC    5/5  5/5 Knee extension  5/5  5/5 Hip flexion   5/5  5/5  -Sensory exam    Sensation intact to light touch in L3-S1 nerve distributions of bilateral lower extremities  -Achilles DTR: 1/4 on the left, 1/4 on the right -Patellar tendon DTR: 1/4 on the left, 1/4 on the right  -Straight leg raise: Negative bilaterally -Femoral nerve stretch test: Negative bilaterally -Clonus: no beats bilaterally  -Left hip exam: Back pain with FADIR, negative FABER, no pain through remainder of range of motion, negative Stinchfield, negative SI joint compression test -Right hip exam: Positive FADIR, no pain through remainder of range of motion, negative FABER, negative Stinchfield, negative SI joint compression test  Imaging: XRs of the lumbar spine from 06/03/2023 were independently reviewed and interpreted, showing  disc height loss and small anterior osteophyte formation at L4/5 and L5/S1. No fracture or dislocation seen. No evidence of instability on flexion/extension views.    Patient name: Alejandro Beltran Patient MRN:  [id number] Date of visit: 06/03/23

## 2023-06-04 ENCOUNTER — Encounter: Payer: Self-pay | Admitting: Podiatry

## 2023-07-01 ENCOUNTER — Other Ambulatory Visit (HOSPITAL_BASED_OUTPATIENT_CLINIC_OR_DEPARTMENT_OTHER)

## 2023-07-02 LAB — CBC WITH DIFFERENTIAL/PLATELET
Basophils Absolute: 0 10*3/uL (ref 0.0–0.2)
Basos: 1 %
EOS (ABSOLUTE): 0.3 10*3/uL (ref 0.0–0.4)
Eos: 6 %
Hematocrit: 48.6 % (ref 37.5–51.0)
Hemoglobin: 15.7 g/dL (ref 13.0–17.7)
Immature Grans (Abs): 0 10*3/uL (ref 0.0–0.1)
Immature Granulocytes: 0 %
Lymphocytes Absolute: 1.6 10*3/uL (ref 0.7–3.1)
Lymphs: 36 %
MCH: 29.5 pg (ref 26.6–33.0)
MCHC: 32.3 g/dL (ref 31.5–35.7)
MCV: 91 fL (ref 79–97)
Monocytes Absolute: 0.5 10*3/uL (ref 0.1–0.9)
Monocytes: 11 %
Neutrophils Absolute: 2.1 10*3/uL (ref 1.4–7.0)
Neutrophils: 46 %
Platelets: 192 10*3/uL (ref 150–450)
RBC: 5.32 x10E6/uL (ref 4.14–5.80)
RDW: 12.1 % (ref 11.6–15.4)
WBC: 4.5 10*3/uL (ref 3.4–10.8)

## 2023-07-02 LAB — COMPREHENSIVE METABOLIC PANEL
ALT: 27 [IU]/L (ref 0–44)
AST: 31 [IU]/L (ref 0–40)
Albumin: 4.4 g/dL (ref 3.8–4.9)
Alkaline Phosphatase: 71 [IU]/L (ref 44–121)
BUN/Creatinine Ratio: 16 (ref 9–20)
BUN: 18 mg/dL (ref 6–24)
Bilirubin Total: 0.4 mg/dL (ref 0.0–1.2)
CO2: 25 mmol/L (ref 20–29)
Calcium: 9.5 mg/dL (ref 8.7–10.2)
Chloride: 100 mmol/L (ref 96–106)
Creatinine, Ser: 1.14 mg/dL (ref 0.76–1.27)
Globulin, Total: 2.2 g/dL (ref 1.5–4.5)
Glucose: 96 mg/dL (ref 70–99)
Potassium: 4.5 mmol/L (ref 3.5–5.2)
Sodium: 140 mmol/L (ref 134–144)
Total Protein: 6.6 g/dL (ref 6.0–8.5)
eGFR: 75 mL/min/{1.73_m2} (ref >59)

## 2023-07-02 LAB — HEMOGLOBIN A1C
Est. average glucose Bld gHb Est-mCnc: 123 mg/dL
Hgb A1c MFr Bld: 5.9 % — ABNORMAL HIGH (ref 4.8–5.6)

## 2023-07-02 LAB — LIPID PANEL
Chol/HDL Ratio: 3 {ratio} (ref 0.0–5.0)
Cholesterol, Total: 177 mg/dL (ref 100–199)
HDL: 59 mg/dL (ref >39)
LDL Chol Calc (NIH): 106 mg/dL — ABNORMAL HIGH (ref 0–99)
Triglycerides: 60 mg/dL (ref 0–149)
VLDL Cholesterol Cal: 12 mg/dL (ref 5–40)

## 2023-07-02 LAB — TSH RFX ON ABNORMAL TO FREE T4: TSH: 2.29 u[IU]/mL (ref 0.450–4.500)

## 2023-07-06 ENCOUNTER — Encounter (HOSPITAL_BASED_OUTPATIENT_CLINIC_OR_DEPARTMENT_OTHER): Payer: Self-pay | Admitting: Family Medicine

## 2023-07-13 ENCOUNTER — Encounter (HOSPITAL_BASED_OUTPATIENT_CLINIC_OR_DEPARTMENT_OTHER): Admitting: Family Medicine

## 2023-07-15 ENCOUNTER — Ambulatory Visit: Admitting: Orthopedic Surgery

## 2023-07-15 DIAGNOSIS — M5416 Radiculopathy, lumbar region: Secondary | ICD-10-CM | POA: Diagnosis not present

## 2023-07-15 NOTE — Progress Notes (Signed)
Orthopedic Spine Surgery Office Note   Assessment: Patient is a 57 y.o. male with low back pain that radiates into the left buttock, posterior and lateral thigh. Possible radiculopathy     Plan: -Explained that initially conservative treatment is tried as a significant number of patients may experience relief with these treatment modalities. Discussed that the conservative treatments include:             -activity modification             -physical therapy             -over the counter pain medications             -medrol dosepak             -lumbar steroid injections -Patient has tried home exercise program, tylenol, aleve, lyrica -Patient has had symptoms for over 6 weeks now in spite of conservative treatment, so recommend MRI of the lumbar spine to evaluate for radiculopathy -Patient should return to office in 4 weeks, x-rays at next visit: none     Patient expressed understanding of the plan and all questions were answered to the patient's satisfaction.    ___________________________________________________________________________     History:   Patient is a 57 y.o. male who presents today for follow-up on his lumbar spine.  Patient continues to have low back pain that radiates into the left buttock and left thigh.  He feels it in the left buttock and left posterolateral thigh.  It does not radiate past the knee.  He is not having any symptoms on the right side.  He feels pain on a daily basis.  He feels it with activity and with rest.  He tried the Lyrica and said it helped minimally.  However, it did not give him any significant relief.  He has been doing the home exercise program and that did not give him any significant relief either.  He has not developed any new symptoms since the last time he was seen.   Treatments tried: home exercise, program, Tylenol, Aleve, lyrica    Physical Exam:   General: no acute distress, appears stated age Neurologic: alert, answering questions  appropriately, following commands Respiratory: unlabored breathing on room air, symmetric chest rise Psychiatric: appropriate affect, normal cadence to speech     MSK (spine):   -Strength exam                                                   Left                  Right EHL                              5/5                  5/5 TA                                 5/5                  5/5 GSC  5/5                  5/5 Knee extension            5/5                  5/5 Hip flexion                    5/5                  5/5   -Sensory exam                           Sensation intact to light touch in L3-S1 nerve distributions of bilateral lower extremities   -Achilles DTR: 1/4 on the left, 1/4 on the right -Patellar tendon DTR: 1/4 on the left, 1/4 on the right   -Straight leg raise: Negative bilaterally -Femoral nerve stretch test: Negative bilaterally -Clonus: no beats bilaterally     Imaging: XRs of the lumbar spine from 06/03/2023 were independently reviewed and interpreted, showing disc height loss and small anterior osteophyte formation at L4/5 and L5/S1. No fracture or dislocation seen. No evidence of instability on flexion/extension views.      Patient name: Alejandro Beltran Patient MRN: [id number] Date of visit: 07/15/23

## 2023-07-16 ENCOUNTER — Ambulatory Visit (INDEPENDENT_AMBULATORY_CARE_PROVIDER_SITE_OTHER): Admitting: Family Medicine

## 2023-07-16 ENCOUNTER — Encounter (HOSPITAL_BASED_OUTPATIENT_CLINIC_OR_DEPARTMENT_OTHER): Payer: Self-pay | Admitting: Family Medicine

## 2023-07-16 ENCOUNTER — Other Ambulatory Visit (HOSPITAL_BASED_OUTPATIENT_CLINIC_OR_DEPARTMENT_OTHER): Payer: Self-pay

## 2023-07-16 VITALS — BP 131/86 | HR 58 | Ht 69.0 in | Wt 160.1 lb

## 2023-07-16 DIAGNOSIS — Z113 Encounter for screening for infections with a predominantly sexual mode of transmission: Secondary | ICD-10-CM

## 2023-07-16 DIAGNOSIS — Z9889 Other specified postprocedural states: Secondary | ICD-10-CM | POA: Insufficient documentation

## 2023-07-16 DIAGNOSIS — M546 Pain in thoracic spine: Secondary | ICD-10-CM | POA: Insufficient documentation

## 2023-07-16 DIAGNOSIS — Z125 Encounter for screening for malignant neoplasm of prostate: Secondary | ICD-10-CM

## 2023-07-16 DIAGNOSIS — N529 Male erectile dysfunction, unspecified: Secondary | ICD-10-CM | POA: Diagnosis not present

## 2023-07-16 DIAGNOSIS — Z0000 Encounter for general adult medical examination without abnormal findings: Secondary | ICD-10-CM | POA: Diagnosis not present

## 2023-07-16 DIAGNOSIS — Z23 Encounter for immunization: Secondary | ICD-10-CM | POA: Diagnosis not present

## 2023-07-16 DIAGNOSIS — M25569 Pain in unspecified knee: Secondary | ICD-10-CM | POA: Insufficient documentation

## 2023-07-16 DIAGNOSIS — M1991 Primary osteoarthritis, unspecified site: Secondary | ICD-10-CM | POA: Insufficient documentation

## 2023-07-16 DIAGNOSIS — Z0289 Encounter for other administrative examinations: Secondary | ICD-10-CM | POA: Insufficient documentation

## 2023-07-16 MED ORDER — TADALAFIL 20 MG PO TABS: ORAL_TABLET | ORAL | 1 refills | Status: DC

## 2023-07-16 MED ORDER — SHINGRIX 50 MCG/0.5ML IM SUSR
0.5000 mL | Freq: Once | INTRAMUSCULAR | 0 refills | Status: AC
  Filled 2023-07-16: qty 0.5, 1d supply, fill #0

## 2023-07-16 NOTE — Assessment & Plan Note (Signed)
Routine HCM labs reviewed. HCM reviewed/discussed. Anticipatory guidance regarding healthy weight, lifestyle and choices given. Recommend healthy diet.  Recommend approximately 150 minutes/week of moderate intensity exercise Recommend regular dental and vision exams Always use seatbelt/lap and shoulder restraints Recommend using smoke alarms and checking batteries at least twice a year Recommend using sunscreen when outside Discussed colon cancer screening recommendations, options.  Patient reports being UTD Discussed immunization recommendations The natural history of prostate cancer and ongoing controversy regarding screening and potential treatment outcomes of prostate cancer has been discussed with the patient. The meaning of a false positive PSA and a false negative PSA has been discussed. He indicates understanding of the limitations of this screening test and wishes to proceed with screening PSA testing.

## 2023-07-16 NOTE — Progress Notes (Signed)
Subjective:    CC: Annual Physical Exam  HPI: Alejandro Beltran is a 57 y.o. presenting for annual physical  I reviewed the past medical history, family history, social history, surgical history, and allergies today and no changes were needed.  Please see the problem list section below in epic for further details.  Past Medical History: Past Medical History:  Diagnosis Date   Acute medial meniscal tear    left   Acute meniscal tear of knee    Allergy    Glaucoma    Peanut allergy    Past Surgical History: Past Surgical History:  Procedure Laterality Date   EYE SURGERY     rt retina sx   MENISCUS REPAIR  08/2015   Social History: Social History   Socioeconomic History   Marital status: Married    Spouse name: Not on file   Number of children: Not on file   Years of education: Not on file   Highest education level: Not on file  Occupational History   Occupation: Group Optometrist: [employer]  Tobacco Use   Smoking status: Never    Passive exposure: Yes   Smokeless tobacco: Never  Vaping Use   Vaping status: Never Used  Substance and Sexual Activity   Alcohol use: Yes    Alcohol/week: 2.0 standard drinks of alcohol    Types: 2 Standard drinks or equivalent per week    Comment: socially   Drug use: No   Sexual activity: Yes    Partners: Female    Birth control/protection: Condom  Other Topics Concern   Not on file  Social History Narrative   Not on file   Social Determinants of Health   Financial Resource Strain: Not on file  Food Insecurity: Not on file  Transportation Needs: Not on file  Physical Activity: Not on file  Stress: Not on file  Social Connections: Not on file   Family History: Family History  Problem Relation Age of Onset   Hypertension Mother    Anuerysm Father    Diabetes Maternal Grandmother    Stroke Maternal Grandmother    Cancer Maternal Grandfather    Colon cancer Neg Hx    Allergies: Allergies   Allergen Reactions   Other Anaphylaxis    "nuts" - pt does not recall what kind, he can eat peanuts and peanut butter   Peanuts [Peanut Oil] Anaphylaxis    Exotic nuts   Pecan Extract Anaphylaxis   Pistachio Nut Extract Anaphylaxis   Medications: See med rec.  Review of Systems: No headache, visual changes, nausea, vomiting, diarrhea, constipation, dizziness, abdominal pain, skin rash, fevers, chills, night sweats, swollen lymph nodes, weight loss, chest pain, body aches, joint swelling, muscle aches, shortness of breath, mood changes, visual or auditory hallucinations.  Objective:    BP 131/86 (BP Location: Right Arm, Patient Position: Sitting, Cuff Size: Normal)   Pulse (!) 58   Ht 5\' 9"  (1.753 m)   Wt 160 lb 1.6 oz (72.6 kg)   SpO2 100%   BMI 23.64 kg/m   General: Well Developed, well nourished, and in no acute distress.  Neuro: Alert and oriented x3, extra-ocular muscles intact, sensation grossly intact. Cranial nerves II through XII are intact, motor, sensory, and coordinative functions are all intact. HEENT: Normocephalic, atraumatic, pupils equal round reactive to light, neck supple, no masses, no lymphadenopathy, thyroid nonpalpable. Oropharynx, nasopharynx, external ear canals are unremarkable. Skin: Warm and dry, no rashes noted.  Cardiac: Regular  rate and rhythm, no murmurs rubs or gallops.  Respiratory: Clear to auscultation bilaterally. Not using accessory muscles, speaking in full sentences.  Abdominal: Soft, nontender, nondistended, positive bowel sounds, no masses, no organomegaly.  Musculoskeletal: Shoulder, elbow, wrist, hip, knee, ankle stable, and with full range of motion.  Impression and Recommendations:    Encounter for immunization -     Flu vaccine trivalent PF, 6mos and older(Flulaval,Afluria,Fluarix,Fluzone)  Wellness examination Assessment & Plan: Routine HCM labs reviewed. HCM reviewed/discussed. Anticipatory guidance regarding healthy weight,  lifestyle and choices given. Recommend healthy diet.  Recommend approximately 150 minutes/week of moderate intensity exercise Recommend regular dental and vision exams Always use seatbelt/lap and shoulder restraints Recommend using smoke alarms and checking batteries at least twice a year Recommend using sunscreen when outside Discussed colon cancer screening recommendations, options.  Patient reports being UTD Discussed immunization recommendations The natural history of prostate cancer and ongoing controversy regarding screening and potential treatment outcomes of prostate cancer has been discussed with the patient. The meaning of a false positive PSA and a false negative PSA has been discussed. He indicates understanding of the limitations of this screening test and wishes to proceed with screening PSA testing.  Orders: -     PSA Total (Reflex To Free) -     Vitamin B12 -     VITAMIN D 25 Hydroxy (Vit-D Deficiency, Fractures)  Routine screening for STI (sexually transmitted infection) -     HIV Antibody (routine testing w rflx) -     Ct, Ng, Mycoplasmas NAA, Urine -     RPR  Other orders -     Shingrix; Inject 0.5 mLs into the muscle once for 1 dose. Administer at 0 and 2-6 months for 2 total doses.  To be administered by pharmacy.  Dispense: 0.5 mL; Refill: 0  Return in about 6 months (around 01/14/2024) for referrals.   ___________________________________________ Shannel Zahm de [place], MD, ABFM, CAQSM Primary Care and Sports Medicine [facility]

## 2023-07-19 LAB — RPR: RPR Ser Ql: NONREACTIVE

## 2023-07-19 LAB — PSA TOTAL (REFLEX TO FREE): Prostate Specific Ag, Serum: 0.7 ng/mL (ref 0.0–4.0)

## 2023-07-19 LAB — VITAMIN D 25 HYDROXY (VIT D DEFICIENCY, FRACTURES): Vit D, 25-Hydroxy: 74 ng/mL (ref 30.0–100.0)

## 2023-07-19 LAB — HIV ANTIBODY (ROUTINE TESTING W REFLEX): HIV Screen 4th Generation wRfx: NONREACTIVE

## 2023-07-19 LAB — VITAMIN B12: Vitamin B-12: 1067 pg/mL (ref 232–1245)

## 2023-07-31 ENCOUNTER — Encounter: Payer: Self-pay | Admitting: Neurology

## 2023-07-31 ENCOUNTER — Ambulatory Visit (INDEPENDENT_AMBULATORY_CARE_PROVIDER_SITE_OTHER): Admitting: Neurology

## 2023-07-31 VITALS — BP 113/72 | HR 64 | Ht 69.0 in | Wt 158.0 lb

## 2023-07-31 DIAGNOSIS — G43009 Migraine without aura, not intractable, without status migrainosus: Secondary | ICD-10-CM

## 2023-07-31 MED ORDER — ONDANSETRON HCL 4 MG PO TABS: 4.0000 mg | ORAL_TABLET | Freq: Three times a day (TID) | ORAL | 5 refills | Status: DC | PRN

## 2023-07-31 MED ORDER — SUMATRIPTAN SUCCINATE 100 MG PO TABS: ORAL_TABLET | ORAL | 5 refills | Status: DC

## 2023-07-31 NOTE — Patient Instructions (Signed)
  Take sumatriptan 100mg  at earliest onset of headache.  May repeat dose once in 2 hours if needed.  Maximum 2 tablets in 24 hours.  Take ondansetron for nausea Limit use of pain relievers to no more than 2 days out of the week.  These medications include acetaminophen, NSAIDs (ibuprofen/Advil/Motrin, naproxen/Aleve, triptans (Imitrex/sumatriptan), Excedrin, and narcotics.  This will help reduce risk of rebound headaches. Be aware of common food triggers Routine exercise Stay adequately hydrated (aim for 64 oz water daily) Keep headache diary Maintain proper stress management Maintain proper sleep hygiene Do not skip meals Consider supplements:  magnesium citrate 400mg  daily, riboflavin 400mg  daily, coenzyme Q10  300mg  daily
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
## 2023-08-02 ENCOUNTER — Ambulatory Visit
Admission: RE | Admit: 2023-08-02 | Discharge: 2023-08-02 | Disposition: A | Discharge status: Home / Self Care | Source: Ambulatory Visit | Attending: Orthopedic Surgery | Admitting: Orthopedic Surgery

## 2023-08-02 DIAGNOSIS — M5416 Radiculopathy, lumbar region: Secondary | ICD-10-CM

## 2023-08-03 ENCOUNTER — Other Ambulatory Visit: Payer: Self-pay | Admitting: Orthopedic Surgery

## 2023-08-12 ENCOUNTER — Ambulatory Visit: Admitting: Orthopedic Surgery

## 2023-08-12 DIAGNOSIS — M5416 Radiculopathy, lumbar region: Secondary | ICD-10-CM

## 2023-08-12 NOTE — Progress Notes (Signed)
Orthopedic Spine Surgery Office Note   Assessment: Patient is a 57 y.o. male with low back pain that radiates into the left buttock, posterior and lateral thigh.  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 Discussed that the conservative treatments include:             -activity modification             -physical therapy             -over the counter pain medications             -medrol dosepak             -lumbar steroid injections -Patient has tried home exercise program, tylenol, aleve, lyrica -Recommended diagnostic/therapeutic injection with Dr. Alvester Morin.  Referral provided to him today -Went over his MRI with him.  His disc herniation appears more on the right side but his pain is on the left side.  For that reason, I suspect more of his pain is from the foraminal stenosis -Patient should return to office in 4 weeks, x-rays at next visit: none     Patient expressed understanding of the plan and all questions were answered to the patient's satisfaction.    ___________________________________________________________________________     History:   Patient is a 57 y.o. male who presents today for follow-up on his lumbar spine.  He is still having low back pain that radiates into the left buttock and left thigh.  He feels that in the posterior lateral aspects of the thigh.  He is not having any pain in the right lower extremity.  There is no pain radiating past the knee on the left lower extremity.  His pain is the same as it was when I saw him last.  He has not developed any new symptoms. At its worst, he rates the pain as a 7/10.   Treatments tried: home exercise, program, Tylenol, Aleve, lyrica     Physical Exam:   General: no acute distress, appears stated  age Neurologic: alert, answering questions appropriately, following commands Respiratory: unlabored breathing on room air, symmetric chest rise Psychiatric: appropriate affect, normal cadence to speech     MSK (spine):   -Strength exam                                                   Left                  Right EHL                              5/5                  5/5 TA                                 5/5                  5/5 GSC  5/5                  5/5 Knee extension            5/5                  5/5 Hip flexion                    5/5                  5/5   -Sensory exam                           Sensation intact to light touch in L3-S1 nerve distributions of bilateral lower extremities   -Achilles DTR: 1/4 on the left, 1/4 on the right -Patellar tendon DTR: 1/4 on the left, 1/4 on the right   -Straight leg raise: Negative bilaterally -Femoral nerve stretch test: Negative bilaterally -Clonus: no beats bilaterally     Imaging: XRs of the lumbar spine from 06/03/2023 were previously independently reviewed and interpreted, showing disc height loss and small anterior osteophyte formation at L4/5 and L5/S1. No fracture or dislocation seen. No evidence of instability on flexion/extension views.    MRI of the lumbar spine from 08/02/2023 was independently reviewed and interpreted, showing left paracentral disc herniation at L5/S1.  Bilateral foraminal stenosis at L5/S1.  No other significant stenosis seen.    Patient name: Alejandro Beltran Patient MRN: [id number] Date of visit: 08/12/23

## 2023-08-19 DIAGNOSIS — H539 Unspecified visual disturbance: Secondary | ICD-10-CM | POA: Insufficient documentation

## 2023-08-25 ENCOUNTER — Telehealth: Payer: Self-pay

## 2023-08-25 ENCOUNTER — Other Ambulatory Visit: Payer: Self-pay | Admitting: Podiatry

## 2023-08-25 DIAGNOSIS — Z79899 Other long term (current) drug therapy: Secondary | ICD-10-CM

## 2023-08-25 MED ORDER — TERBINAFINE HCL 250 MG PO TABS: 250.0000 mg | ORAL_TABLET | Freq: Every day | ORAL | 0 refills | Status: AC

## 2023-08-25 NOTE — Telephone Encounter (Signed)
Patient called and left a message - he never picked up the lamisil before, so he never started it - he is asking for Rx - it will be his first round of medication Thanks

## 2023-09-01 ENCOUNTER — Encounter: Admitting: Physical Medicine and Rehabilitation

## 2023-09-18 ENCOUNTER — Ambulatory Visit: Admitting: Neurology

## 2023-09-29 ENCOUNTER — Other Ambulatory Visit: Payer: Self-pay

## 2023-09-29 ENCOUNTER — Ambulatory Visit: Admitting: Physical Medicine and Rehabilitation

## 2023-09-29 DIAGNOSIS — M5416 Radiculopathy, lumbar region: Secondary | ICD-10-CM | POA: Diagnosis not present

## 2023-09-29 MED ORDER — METHYLPREDNISOLONE ACETATE 40 MG/ML IJ SUSP
40.0000 mg | Freq: Once | INTRAMUSCULAR | Status: AC
  Administered 2023-09-29: 40 mg

## 2023-09-29 NOTE — Progress Notes (Signed)
 Functional Pain Scale - descriptive words and definitions    Average Pain 3  L > R  +Driver, -BT, -Dye Allergies.

## 2023-09-29 NOTE — Procedures (Signed)
 Lumbosacral Transforaminal Epidural Steroid Injection - Sub-Pedicular Approach with Fluoroscopic Guidance  Patient: Alejandro Beltran      Date of Birth: 08/08/1966 MRN: [id number] PCP: de [place], Raymond J, MD      Visit Date: 09/29/2023   Universal Protocol:    Date/Time: 09/29/2023  Consent Given By: the patient  Position: PRONE  Additional Comments: Vital signs were monitored before and after the procedure. Patient was prepped and draped in the usual sterile fashion. The correct patient, procedure, and site was verified.   Injection Procedure Details:   Procedure diagnoses: Radiculopathy, lumbar region [M54.16]    Meds Administered:  Meds ordered this encounter  Medications   methylPREDNISolone  acetate (DEPO-MEDROL ) injection 40 mg    Laterality: Left  Location/Site: L5  Needle:5.0 in., 22 ga.  Short bevel or Quincke spinal needle  Needle Placement: Transforaminal  Findings:    -Comments: Excellent flow of contrast along the nerve, nerve root and into the epidural space.  Procedure Details: After squaring off the end-plates to get a true AP view, the C-arm was positioned so that an oblique view of the foramen as noted above was visualized. The target area is just inferior to the nose of the scotty dog or sub pedicular. The soft tissues overlying this structure were infiltrated with 2-3 ml. of 1% Lidocaine without Epinephrine .  The spinal needle was inserted toward the target using a trajectory view along the fluoroscope beam.  Under AP and lateral visualization, the needle was advanced so it did not puncture dura and was located close the 6 O'Clock position of the pedical in AP tracterory. Biplanar projections were used to confirm position. Aspiration was confirmed to be negative for CSF and/or blood. A 1-2 ml. volume of Isovue-250 was injected and flow of contrast was noted at each level. Radiographs were obtained for documentation purposes.   After attaining the  desired flow of contrast documented above, a 0.5 to 1.0 ml test dose of 0.25% Marcaine was injected into each respective transforaminal space.  The patient was observed for 90 seconds post injection.  After no sensory deficits were reported, and normal lower extremity motor function was noted,   the above injectate was administered so that equal amounts of the injectate were placed at each foramen (level) into the transforaminal epidural space.   Additional Comments:  No complications occurred Dressing: 2 x 2 sterile gauze and Band-Aid    Post-procedure details: Patient was observed during the procedure. Post-procedure instructions were reviewed.  Patient left the clinic in stable condition.

## 2023-09-29 NOTE — Progress Notes (Signed)
 Alejandro Beltran - 58 y.o. male MRN [id number]  Date of birth: [birth year]  Office Visit Note: Visit Date: 09/29/2023 PCP: de [place], Raymond J, MD Referred by: Georgina Ozell LABOR, MD  Subjective: Chief Complaint  Patient presents with   Lower Back - Pain   HPI:  Alejandro Beltran is a 58 y.o. male who comes in today at the request of Dr. Ozell Georgina for planned Left L5-S1 Lumbar Transforaminal epidural steroid injection with fluoroscopic guidance.  The patient has failed conservative care including home exercise, medications, time and activity modification.  This injection will be diagnostic and hopefully therapeutic.  Please see requesting physician notes for further details and justification.   ROS Otherwise per HPI.  Assessment & Plan: Visit Diagnoses:    ICD-10-CM   1. Radiculopathy, lumbar region  M54.16 XR C-ARM NO REPORT    Epidural Steroid injection    methylPREDNISolone  acetate (DEPO-MEDROL ) injection 40 mg      Plan: No additional findings.   Meds & Orders:  Meds ordered this encounter  Medications   methylPREDNISolone  acetate (DEPO-MEDROL ) injection 40 mg    Orders Placed This Encounter  Procedures   XR C-ARM NO REPORT   Epidural Steroid injection    Follow-up: Return for visit to requesting provider as needed.   Procedures: No procedures performed  Lumbosacral Transforaminal Epidural Steroid Injection - Sub-Pedicular Approach with Fluoroscopic Guidance  Patient: Alejandro Beltran      Date of Birth: Jan 31, 1966 MRN: [id number] PCP: de [place], Raymond J, MD      Visit Date: 09/29/2023   Universal Protocol:    Date/Time: 09/29/2023  Consent Given By: the patient  Position: PRONE  Additional Comments: Vital signs were monitored before and after the procedure. Patient was prepped and draped in the usual sterile fashion. The correct patient, procedure, and site was verified.   Injection Procedure Details:   Procedure diagnoses: Radiculopathy, lumbar region  [M54.16]    Meds Administered:  Meds ordered this encounter  Medications   methylPREDNISolone  acetate (DEPO-MEDROL ) injection 40 mg    Laterality: Left  Location/Site: L5  Needle:5.0 in., 22 ga.  Short bevel or Quincke spinal needle  Needle Placement: Transforaminal  Findings:    -Comments: Excellent flow of contrast along the nerve, nerve root and into the epidural space.  Procedure Details: After squaring off the end-plates to get a true AP view, the C-arm was positioned so that an oblique view of the foramen as noted above was visualized. The target area is just inferior to the nose of the scotty dog or sub pedicular. The soft tissues overlying this structure were infiltrated with 2-3 ml. of 1% Lidocaine without Epinephrine .  The spinal needle was inserted toward the target using a trajectory view along the fluoroscope beam.  Under AP and lateral visualization, the needle was advanced so it did not puncture dura and was located close the 6 O'Clock position of the pedical in AP tracterory. Biplanar projections were used to confirm position. Aspiration was confirmed to be negative for CSF and/or blood. A 1-2 ml. volume of Isovue-250 was injected and flow of contrast was noted at each level. Radiographs were obtained for documentation purposes.   After attaining the desired flow of contrast documented above, a 0.5 to 1.0 ml test dose of 0.25% Marcaine was injected into each respective transforaminal space.  The patient was observed for 90 seconds post injection.  After no sensory deficits were reported, and normal lower extremity motor function was noted,  the above injectate was administered so that equal amounts of the injectate were placed at each foramen (level) into the transforaminal epidural space.   Additional Comments:  No complications occurred Dressing: 2 x 2 sterile gauze and Band-Aid    Post-procedure details: Patient was observed during the  procedure. Post-procedure instructions were reviewed.  Patient left the clinic in stable condition.    Clinical History: MRI LUMBAR SPINE WITHOUT CONTRAST   TECHNIQUE: Multiplanar, multisequence MR imaging of the lumbar spine was performed. No intravenous contrast was administered.   COMPARISON:  Lumbar radiographs 06/03/2023.   FINDINGS: Segmentation: Standard segmentation is assumed. The inferior-most fully formed intervertebral disc is labeled L5-S1.   Alignment:  No substantial sagittal subluxation.   Vertebrae: No fracture, evidence of discitis, or bone lesion. Degenerative/discogenic endplate signal changes at L3-L4, L4-L5, and L5-S1.   Conus medullaris and cauda equina: Conus extends to the superior L1 level. Conus and cauda equina appear normal.   Paraspinal and other soft tissues: Unremarkable.   Disc levels:   T12-L1: No significant disc protrusion, foraminal stenosis, or canal stenosis.   L1-L2: No significant disc protrusion, foraminal stenosis, or canal stenosis.   L2-L3: Mild disc bulging and facet arthropathy. No significant stenosis.   L3-L4: Mild disc bulging and facet arthropathy. No significant stenosis.   L4-L5: Mild disc bulging and facet arthropathy. No significant stenosis.   L5-S1: Disc bulging and bilateral facet arthropathy. Resulting mild to moderate bilateral foraminal stenosis. Mild right subarticular recess stenosis at this level with disc touching the descending right S1 nerve roots. No significant canal stenosis.   IMPRESSION: 1. At L5-S1, mild-to-moderate bilateral foraminal stenosis. Mild right subarticular recess stenosis at this level with disc touching the descending right S1 nerve roots. 2. Otherwise, mild multilevel degenerative change without significant stenosis     Electronically Signed   By: Gilmore GORMAN Molt M.D.   On: 08/24/2023 12:06     Objective:  VS:  HT:    WT:   BMI:     BP:   HR: bpm  TEMP: ( )   RESP:  Physical Exam Vitals and nursing note reviewed.  Constitutional:      General: He is not in acute distress.    Appearance: Normal appearance. He is not ill-appearing.  HENT:     Head: Normocephalic and atraumatic.     Right Ear: External ear normal.     Left Ear: External ear normal.     Nose: No congestion.  Eyes:     Extraocular Movements: Extraocular movements intact.  Cardiovascular:     Rate and Rhythm: Normal rate.     Pulses: Normal pulses.  Pulmonary:     Effort: Pulmonary effort is normal. No respiratory distress.  Abdominal:     General: There is no distension.     Palpations: Abdomen is soft.  Musculoskeletal:        General: No tenderness or signs of injury.     Cervical back: Neck supple.     Right lower leg: No edema.     Left lower leg: No edema.     Comments: Patient has good distal strength without clonus.  Skin:    Findings: No erythema or rash.  Neurological:     General: No focal deficit present.     Mental Status: He is alert and oriented to person, place, and time.     Sensory: No sensory deficit.     Motor: No weakness or abnormal muscle tone.  Coordination: Coordination normal.  Psychiatric:        Mood and Affect: Mood normal.        Behavior: Behavior normal.      Imaging: No results found.

## 2023-11-04 ENCOUNTER — Encounter (HOSPITAL_BASED_OUTPATIENT_CLINIC_OR_DEPARTMENT_OTHER): Payer: Self-pay | Admitting: Family Medicine

## 2023-11-04 ENCOUNTER — Ambulatory Visit (HOSPITAL_BASED_OUTPATIENT_CLINIC_OR_DEPARTMENT_OTHER): Admitting: Family Medicine

## 2023-11-04 DIAGNOSIS — G8929 Other chronic pain: Secondary | ICD-10-CM

## 2023-11-04 DIAGNOSIS — Z1159 Encounter for screening for other viral diseases: Secondary | ICD-10-CM

## 2023-11-04 DIAGNOSIS — M545 Low back pain, unspecified: Secondary | ICD-10-CM | POA: Diagnosis not present

## 2023-11-04 DIAGNOSIS — Z113 Encounter for screening for infections with a predominantly sexual mode of transmission: Secondary | ICD-10-CM

## 2023-11-04 DIAGNOSIS — Z0000 Encounter for general adult medical examination without abnormal findings: Secondary | ICD-10-CM

## 2023-11-04 DIAGNOSIS — R519 Headache, unspecified: Secondary | ICD-10-CM

## 2023-11-04 NOTE — Patient Instructions (Signed)
  Medication Instructions:  Your physician recommends that you continue on your current medications as directed. Please refer to the Current Medication list given to you today. --If you need a refill on any your medications before your next appointment, please call your pharmacy first. If no refills are authorized on file call the office.-- Lab Work: Your physician has recommended that you have lab work today: 1 week before If you have labs (blood work) drawn today and your tests are completely normal, you will receive your results via MyChart message OR a phone call from our staff.  Please ensure you check your voicemail in the event that you authorized detailed messages to be left on a delegated number. If you have any lab test that is abnormal or we need to change your treatment, we will call you to review the results.   Follow-Up: Your next appointment:   Your physician recommends that you schedule a follow-up appointment in: 9 months physical with Dr. de Peru  You will receive a text message or e-mail with a link to a survey about your care and experience with [place] today! We would greatly appreciate your feedback!   Thanks for letting us be apart of your health journey!!  Primary Care and Sports Medicine   Dr. Ceasar Mons Peru   We encourage you to activate your patient portal called "MyChart".  Sign up information is provided on this After Visit Summary.  MyChart is used to connect with patients for Virtual Visits (Telemedicine).  Patients are able to view lab/test results, encounter notes, upcoming appointments, etc.  Non-urgent messages can be sent to your provider as well. To learn more about what you can do with MyChart, please visit --  [email or web address].

## 2023-11-04 NOTE — Assessment & Plan Note (Signed)
Has had evaluation with orthopedic surgeon, did have interventional procedure about 1 month ago.  So far, feels that he did have some benefit from injection, however still with some symptoms.  Does plan to follow-up with orthopedic surgeon, however does need to schedule this appointment. Encouraged patient to schedule appointment with orthopedic surgeon for follow-up and to review progress following injection

## 2023-11-04 NOTE — Assessment & Plan Note (Signed)
Has had evaluation with neurologist.  Was started on triptan.  Next appointment is scheduled in about 2 months.  Has had some improvement in headaches with use of medication.  He does feel that symptoms are not completely controlled as of yet. Advised on continuing with plan to follow-up with neurologist.  Discussed potential considerations including adjustments in episodic management or possibly switching to daily prophylactic medication.  He will discuss further with his neurologist at time of follow-up.

## 2023-11-04 NOTE — Progress Notes (Signed)
    Procedures performed today:    None.  Independent interpretation of notes and tests performed by another provider:   None.  Brief History, Exam, Impression, and Recommendations:    BP 138/88 (BP Location: Right Arm, Patient Position: Sitting, Cuff Size: Normal)   Pulse 74   Ht 5\' 9"  (1.753 m)   Wt 162 lb 12.8 oz (73.8 kg)   SpO2 97%   BMI 24.04 kg/m   Wellness examination -     CBC with Differential/Platelet; Future -     Comprehensive metabolic panel; Future -     Hemoglobin A1c; Future -     Lipid panel; Future -     TSH Rfx on Abnormal to Free T4; Future  Routine screening for STI (sexually transmitted infection) -     RPR; Future -     HIV Antibody (routine testing w rflx); Future -     Hepatitis C antibody; Future  Need for hepatitis C screening test -     Hepatitis C antibody; Future  Chronic bilateral low back pain, unspecified whether sciatica present Assessment & Plan: Has had evaluation with orthopedic surgeon, did have interventional procedure about 1 month ago.  So far, feels that he did have some benefit from injection, however still with some symptoms.  Does plan to follow-up with orthopedic surgeon, however does need to schedule this appointment. Encouraged patient to schedule appointment with orthopedic surgeon for follow-up and to review progress following injection   Nonintractable episodic headache, unspecified headache type Assessment & Plan: Has had evaluation with neurologist.  Was started on triptan.  Next appointment is scheduled in about 2 months.  Has had some improvement in headaches with use of medication.  He does feel that symptoms are not completely controlled as of yet. Advised on continuing with plan to follow-up with neurologist.  Discussed potential considerations including adjustments in episodic management or possibly switching to daily prophylactic medication.  He will discuss further with his neurologist at time of  follow-up.   Return in about 9 months (around 08/03/2024) for CPE with fasting labs 1 week prior.   ___________________________________________ Alejandro Gell de [place], MD, ABFM, CAQSM Primary Care and Sports Medicine [facility]

## 2023-12-07 ENCOUNTER — Other Ambulatory Visit (HOSPITAL_BASED_OUTPATIENT_CLINIC_OR_DEPARTMENT_OTHER): Payer: Self-pay | Admitting: Family Medicine

## 2023-12-07 DIAGNOSIS — N529 Male erectile dysfunction, unspecified: Secondary | ICD-10-CM

## 2024-01-06 ENCOUNTER — Ambulatory Visit: Admitting: Neurology

## 2024-01-14 ENCOUNTER — Ambulatory Visit (HOSPITAL_BASED_OUTPATIENT_CLINIC_OR_DEPARTMENT_OTHER): Admitting: Family Medicine

## 2024-01-29 NOTE — Progress Notes (Unsigned)
 NEUROLOGY FOLLOW UP OFFICE NOTE  Alejandro Beltran [id number]  Assessment/Plan:   Migraine without aura, without status migrainosus, not intractable  Migraine prevention:  Not indicated Migraine rescue:  sumatriptan  100mg  and Zofran  4mg  Limit use of pain relievers to no more than 9 days out of the month to prevent risk of rebound or medication-overuse headache. Keep headache diary Follow up ***    Subjective:  Alejandro Beltran is a 58 year old right-handed male with glaucoma who follows up for headaches.  UPDATE: Intensity:  *** Duration:  *** with sumatriptan  Frequency:  *** Frequency of abortive medication: *** Current NSAIDS/analgesics:  Tylenol, Excedrin, Mobic Current triptans:  sumatriptan  100mg  Current ergotamine:  none Current anti-emetic:  Zofran  4mg  Current muscle relaxants:  none Current Antihypertensive medications:  none Current Antidepressant medications:  none Current Anticonvulsant medications:  Lyrica  75mg  twice daily (back pain) Current anti-CGRP:  none Other therapy:  none Other medications:  tadalafil    Caffeine:  Tea. No coffee or soda Exercise:  not routine Depression:  sometimes; Anxiety:  sometimes Other pain:  Chronic back pain, knee replacement Sleep hygiene:  poor.  No more than 5 hours a night.  Trouble falling asleep  HISTORY: Onset:  Off and on in his 20s and 30s, just a mild headache;  These headaches started turning 58 years old Location:  mainly across forehead Quality:  throbbing Intensity:  Severe.   Aura:  absent Prodrome:  absent Associated symptoms:  Nausea, photophobia, phonophobia, slight blurred vision.  He denies associated unilateral numbness or weakness. Duration:  several hours Frequency:  once a month on average Triggers/aggravating factors:  bright light, loud noise Relieving factors:  rest in dark and quiet room Activity:  aggravates Treats with Tylenol or Excedrin.    Past NSAIDS/analgesics:  none Past  abortive triptans:  none Past abortive ergotamine:  none Past muscle relaxants:  none Past anti-emetic:  none Past antihypertensive medications:  none Past antidepressant medications:  none Past anticonvulsant medications:  none Past anti-CGRP:  none Other past therapies:  none   Family history of headache:  brother  PAST MEDICAL HISTORY: Past Medical History:  Diagnosis Date   Acute medial meniscal tear    left   Acute meniscal tear of knee    Allergy    Glaucoma    Peanut allergy     MEDICATIONS: Current Outpatient Medications on File Prior to Visit  Medication Sig Dispense Refill   brimonidine-timolol (COMBIGAN) 0.2-0.5 % ophthalmic solution Place 1 drop into both eyes every 12 (twelve) hours.     EPINEPHrine  (EPIPEN  2-PAK) 0.3 mg/0.3 mL IJ SOAJ injection Inject 0.3 mg into the muscle once as needed for up to 1 dose. 2 each 2   meloxicam (MOBIC) 15 MG tablet TAKE 1 TABLET BY MOUTH ONCE DAILY AS NEEDED FOR PAIN     Multiple Vitamin (MULTIVITAMIN) tablet Take 1 tablet by mouth daily.     ondansetron  (ZOFRAN ) 4 MG tablet Take 1 tablet (4 mg total) by mouth every 8 (eight) hours as needed. 20 tablet 5   pregabalin  (LYRICA ) 75 MG capsule Take 1 capsule by mouth twice daily 84 capsule 0   SUMAtriptan  (IMITREX ) 100 MG tablet Take 1 tablet at earliest onset of migraine.  May repeat in 2 hours if headache persists or recurs. Maximum 2 tablets in 24 hours. 10 tablet 5   tadalafil  (CIALIS ) 20 MG tablet TAKE 1/2 TO 1 TABLET AS NEEDED FOR ED, MAX 20 MG IN 24 HOURS 30 tablet  0   terbinafine  (LAMISIL ) 250 MG tablet Take 1 tablet (250 mg total) by mouth daily. 90 tablet 0   [DISCONTINUED] fluticasone  (FLONASE ) 50 MCG/ACT nasal spray Place 1 spray into both nostrils daily. 15.8 mL 2   No current facility-administered medications on file prior to visit.    ALLERGIES: Allergies  Allergen Reactions   Other Anaphylaxis    "nuts" - pt does not recall what kind, he can eat peanuts and peanut  butter   Peanuts [Peanut Oil] Anaphylaxis    Exotic nuts   Pecan Extract Anaphylaxis   Pistachio Nut Extract Anaphylaxis    FAMILY HISTORY: Family History  Problem Relation Age of Onset   Hypertension Mother    Anuerysm Father    Diabetes Maternal Grandmother    Stroke Maternal Grandmother    Cancer Maternal Grandfather    Colon cancer Neg Hx       Objective:  *** General: No acute distress.  Patient appears ***-groomed.   Head:  Normocephalic/atraumatic Eyes:  Fundi examined but not visualized Neck: supple, no paraspinal tenderness, full range of motion Heart:  Regular rate and rhythm Lungs:  Clear to auscultation bilaterally Back: No paraspinal tenderness Neurological Exam: alert and oriented.  Speech fluent and not dysarthric, language intact.  CN II-XII intact. Bulk and tone normal, muscle strength 5/5 throughout.  Sensation to light touch intact.  Deep tendon reflexes 2+ throughout, toes downgoing.  Finger to nose testing intact.  Gait normal, Romberg negative.   Janne Members, DO  CC: ***

## 2024-02-01 ENCOUNTER — Ambulatory Visit (INDEPENDENT_AMBULATORY_CARE_PROVIDER_SITE_OTHER): Admitting: Neurology

## 2024-02-01 ENCOUNTER — Encounter: Payer: Self-pay | Admitting: Neurology

## 2024-02-01 VITALS — BP 124/74 | HR 58 | Ht 68.0 in | Wt 158.0 lb

## 2024-02-01 DIAGNOSIS — G43009 Migraine without aura, not intractable, without status migrainosus: Secondary | ICD-10-CM | POA: Diagnosis not present

## 2024-02-01 MED ORDER — ONDANSETRON HCL 4 MG PO TABS: 4.0000 mg | ORAL_TABLET | Freq: Three times a day (TID) | ORAL | 5 refills | Status: AC | PRN

## 2024-02-01 MED ORDER — RIZATRIPTAN BENZOATE 10 MG PO TABS: 10.0000 mg | ORAL_TABLET | ORAL | 5 refills | Status: AC | PRN

## 2024-02-01 NOTE — Patient Instructions (Signed)
 1. Instead of sumatriptan , take rizatriptan at earliest onset of migraine.  May repeat after 2 hours.  2 tablets in 24 hours maximum.  If you like sumatriptan  better, let me know 2.  Ondansetron  for nausea 3.  Limit use of pain relievers to no more than 9 days out of the month to prevent risk of rebound or medication-overuse headache. 4.  Keep headache diary

## 2024-02-08 ENCOUNTER — Other Ambulatory Visit (HOSPITAL_BASED_OUTPATIENT_CLINIC_OR_DEPARTMENT_OTHER): Payer: Self-pay | Admitting: Family Medicine

## 2024-02-08 ENCOUNTER — Other Ambulatory Visit: Payer: Self-pay | Admitting: Podiatry

## 2024-02-08 DIAGNOSIS — N529 Male erectile dysfunction, unspecified: Secondary | ICD-10-CM

## 2024-05-18 ENCOUNTER — Other Ambulatory Visit (HOSPITAL_BASED_OUTPATIENT_CLINIC_OR_DEPARTMENT_OTHER): Payer: Self-pay | Admitting: Family Medicine

## 2024-05-18 DIAGNOSIS — N529 Male erectile dysfunction, unspecified: Secondary | ICD-10-CM

## 2024-07-05 ENCOUNTER — Other Ambulatory Visit (HOSPITAL_BASED_OUTPATIENT_CLINIC_OR_DEPARTMENT_OTHER): Payer: Self-pay | Admitting: Family Medicine

## 2024-07-05 DIAGNOSIS — N529 Male erectile dysfunction, unspecified: Secondary | ICD-10-CM

## 2024-07-05 MED ORDER — TADALAFIL 20 MG PO TABS: ORAL_TABLET | ORAL | 0 refills | Status: DC

## 2024-07-05 NOTE — Telephone Encounter (Signed)
 Copied from CRM #[id number]. Topic: Clinical - Medication Refill >> Jul 05, 2024  2:20 PM Sophia H wrote: Medication: tadalafil  (CIALIS ) 20 MG tablet   Has the patient contacted their pharmacy? Yes, new rx needed.   This is the patient's preferred pharmacy:   [facility] - [place], [place] - [address] [place] [address] Phone: [phone] Fax: [phone]   Is this the correct pharmacy for this prescription? Yes If no, delete pharmacy and type the correct one.   Has the prescription been filled recently? Yes  Is the patient out of the medication? Yes  Has the patient been seen for an appointment in the last year OR does the patient have an upcoming appointment? Yes, Appt Nov 12.   Can we respond through MyChart? Yes  Agent: Please be advised that Rx refills may take up to 3 business days. We ask that you follow-up with your pharmacy.

## 2024-07-25 ENCOUNTER — Encounter: Payer: Self-pay | Admitting: Radiology

## 2024-07-27 ENCOUNTER — Other Ambulatory Visit (HOSPITAL_BASED_OUTPATIENT_CLINIC_OR_DEPARTMENT_OTHER)

## 2024-08-03 ENCOUNTER — Encounter (HOSPITAL_BASED_OUTPATIENT_CLINIC_OR_DEPARTMENT_OTHER): Admitting: Family Medicine

## 2024-09-05 ENCOUNTER — Encounter (HOSPITAL_BASED_OUTPATIENT_CLINIC_OR_DEPARTMENT_OTHER): Payer: Self-pay

## 2024-09-12 ENCOUNTER — Other Ambulatory Visit (HOSPITAL_BASED_OUTPATIENT_CLINIC_OR_DEPARTMENT_OTHER)

## 2024-09-12 ENCOUNTER — Other Ambulatory Visit (HOSPITAL_BASED_OUTPATIENT_CLINIC_OR_DEPARTMENT_OTHER): Payer: Self-pay | Admitting: Family Medicine

## 2024-09-12 DIAGNOSIS — Z1159 Encounter for screening for other viral diseases: Secondary | ICD-10-CM

## 2024-09-12 DIAGNOSIS — Z113 Encounter for screening for infections with a predominantly sexual mode of transmission: Secondary | ICD-10-CM

## 2024-09-12 DIAGNOSIS — Z0000 Encounter for general adult medical examination without abnormal findings: Secondary | ICD-10-CM

## 2024-09-13 LAB — CBC WITH DIFFERENTIAL/PLATELET
Basophils Absolute: 0 x10E3/uL (ref 0.0–0.2)
Basos: 1 %
EOS (ABSOLUTE): 0.2 x10E3/uL (ref 0.0–0.4)
Eos: 4 %
Hematocrit: 46.8 % (ref 37.5–51.0)
Hemoglobin: 14.8 g/dL (ref 13.0–17.7)
Immature Grans (Abs): 0 x10E3/uL (ref 0.0–0.1)
Immature Granulocytes: 0 %
Lymphocytes Absolute: 1.4 x10E3/uL (ref 0.7–3.1)
Lymphs: 37 %
MCH: 29.4 pg (ref 26.6–33.0)
MCHC: 31.6 g/dL (ref 31.5–35.7)
MCV: 93 fL (ref 79–97)
Monocytes Absolute: 0.4 x10E3/uL (ref 0.1–0.9)
Monocytes: 11 %
Neutrophils Absolute: 1.7 x10E3/uL (ref 1.4–7.0)
Neutrophils: 47 %
Platelets: 207 x10E3/uL (ref 150–450)
RBC: 5.04 x10E6/uL (ref 4.14–5.80)
RDW: 12.3 % (ref 11.6–15.4)
WBC: 3.7 x10E3/uL (ref 3.4–10.8)

## 2024-09-13 LAB — LIPID PANEL
Chol/HDL Ratio: 2.9 ratio (ref 0.0–5.0)
Cholesterol, Total: 167 mg/dL (ref 100–199)
HDL: 58 mg/dL
LDL Chol Calc (NIH): 98 mg/dL (ref 0–99)
Triglycerides: 57 mg/dL (ref 0–149)
VLDL Cholesterol Cal: 11 mg/dL (ref 5–40)

## 2024-09-13 LAB — SYPHILIS: RPR W/REFLEX TO RPR TITER AND TREPONEMAL ANTIBODIES, TRADITIONAL SCREENING AND DIAGNOSIS ALGORITHM: RPR Ser Ql: NONREACTIVE

## 2024-09-13 LAB — COMPREHENSIVE METABOLIC PANEL WITH GFR
ALT: 22 IU/L (ref 0–44)
AST: 32 IU/L (ref 0–40)
Albumin: 4.3 g/dL (ref 3.8–4.9)
Alkaline Phosphatase: 62 IU/L (ref 47–123)
BUN/Creatinine Ratio: 14 (ref 9–20)
BUN: 16 mg/dL (ref 6–24)
Bilirubin Total: 0.4 mg/dL (ref 0.0–1.2)
CO2: 24 mmol/L (ref 20–29)
Calcium: 9.4 mg/dL (ref 8.7–10.2)
Chloride: 101 mmol/L (ref 96–106)
Creatinine, Ser: 1.15 mg/dL (ref 0.76–1.27)
Globulin, Total: 1.8 g/dL (ref 1.5–4.5)
Glucose: 94 mg/dL (ref 70–99)
Potassium: 5 mmol/L (ref 3.5–5.2)
Sodium: 138 mmol/L (ref 134–144)
Total Protein: 6.1 g/dL (ref 6.0–8.5)
eGFR: 74 mL/min/1.73

## 2024-09-13 LAB — TSH RFX ON ABNORMAL TO FREE T4: TSH: 1.9 u[IU]/mL (ref 0.450–4.500)

## 2024-09-13 LAB — HEPATITIS C ANTIBODY: Hep C Virus Ab: NONREACTIVE

## 2024-09-13 LAB — HEMOGLOBIN A1C
Est. average glucose Bld gHb Est-mCnc: 117 mg/dL
Hgb A1c MFr Bld: 5.7 % — ABNORMAL HIGH (ref 4.8–5.6)

## 2024-09-13 LAB — HIV ANTIBODY (ROUTINE TESTING W REFLEX): HIV Screen 4th Generation wRfx: NONREACTIVE

## 2024-09-15 ENCOUNTER — Other Ambulatory Visit (HOSPITAL_BASED_OUTPATIENT_CLINIC_OR_DEPARTMENT_OTHER): Payer: Self-pay | Admitting: Family Medicine

## 2024-09-15 DIAGNOSIS — N529 Male erectile dysfunction, unspecified: Secondary | ICD-10-CM

## 2024-09-20 ENCOUNTER — Encounter (HOSPITAL_BASED_OUTPATIENT_CLINIC_OR_DEPARTMENT_OTHER): Admitting: Family Medicine

## 2024-09-28 ENCOUNTER — Ambulatory Visit (HOSPITAL_BASED_OUTPATIENT_CLINIC_OR_DEPARTMENT_OTHER): Admitting: Family Medicine

## 2024-09-28 ENCOUNTER — Encounter (HOSPITAL_BASED_OUTPATIENT_CLINIC_OR_DEPARTMENT_OTHER): Payer: Self-pay | Admitting: Family Medicine

## 2024-09-28 VITALS — BP 151/98 | HR 65 | Ht 68.0 in | Wt 159.6 lb

## 2024-09-28 DIAGNOSIS — M25511 Pain in right shoulder: Secondary | ICD-10-CM | POA: Diagnosis not present

## 2024-09-28 DIAGNOSIS — Z23 Encounter for immunization: Secondary | ICD-10-CM | POA: Diagnosis not present

## 2024-09-28 DIAGNOSIS — M21619 Bunion of unspecified foot: Secondary | ICD-10-CM | POA: Diagnosis not present

## 2024-09-28 DIAGNOSIS — M545 Low back pain, unspecified: Secondary | ICD-10-CM | POA: Diagnosis not present

## 2024-09-28 DIAGNOSIS — G8929 Other chronic pain: Secondary | ICD-10-CM | POA: Diagnosis not present

## 2024-09-28 DIAGNOSIS — R1031 Right lower quadrant pain: Secondary | ICD-10-CM | POA: Diagnosis not present

## 2024-09-28 DIAGNOSIS — R109 Unspecified abdominal pain: Secondary | ICD-10-CM | POA: Insufficient documentation

## 2024-09-28 DIAGNOSIS — R7303 Prediabetes: Secondary | ICD-10-CM

## 2024-09-28 NOTE — Assessment & Plan Note (Signed)
 Intermittent right lower quadrant pain with blood in stool. Normal previous colonoscopy.  Given abdominal pain with associated blood in stool noted, recommend further evaluation with GI - Referred to GI specialist for further evaluation.

## 2024-09-28 NOTE — Assessment & Plan Note (Signed)
 Pain persists despite previous injections and stretching exercises. Previous injections provided temporary relief. Considering repeat injections or alternatives. - Referred to orthopedic surgeon for evaluation and potential repeat injections or alternative treatments.

## 2024-09-28 NOTE — Assessment & Plan Note (Signed)
 Has met with podiatry in the past, recommend further evaluation with them.  Referral placed today to assist with scheduling, given that he last saw them a little bit less than a year and a half ago, can reach out to their office to schedule directly as well

## 2024-09-28 NOTE — Assessment & Plan Note (Signed)
 Pain without recent injury, exacerbated by movement. Previous left shoulder rotator cuff repair. No prior imaging for right shoulder. - Referred to orthopedic surgeon for evaluation.

## 2024-09-28 NOTE — Assessment & Plan Note (Signed)
 Recent A1c remains within prediabetes range, however did show improvement from 5.9 down to 5.7 at this time.  Recommend continuing with lifestyle modifications and we will monitor A1c intermittently

## 2024-09-28 NOTE — Progress Notes (Signed)
 "   Procedures performed today:    None.  Independent interpretation of notes and tests performed by another provider:   None.  Brief History, Exam, Impression, and Recommendations:    BP (!) 151/98 (BP Location: Right Arm, Patient Position: Sitting, Cuff Size: Normal)   Pulse 65   Ht 5' 8 (1.727 m)   Wt 159 lb 9.6 oz (72.4 kg)   SpO2 100%   BMI 24.27 kg/m   Discussed the use of AI scribe software for clinical note transcription with the patient, who gave verbal consent to proceed.  History of Present Illness Alejandro Beltran is a 59 year old male who presents with concerns of blood in the stool and abdominal pain.  He has noticed blood in his stool following episodes of abdominal pain. A colonoscopy was performed approximately two to three years ago, but he does not recall any specific concerns or recommendations from that procedure. He describes two episodes of abdominal pain, with the most recent occurring on December 24th. The pain was located on the right side of his abdomen and was described as cramping and intense, causing him to consider going to the emergency room. However, the pain subsided after a few minutes. No current abdominal pain is reported.  He experiences chronic right shoulder pain, particularly at night, which affects his sleep. He had a rotator cuff surgery on his left shoulder about seven years ago, but now his right shoulder is causing discomfort without any new injury. No prior imaging has been done for the right shoulder.  He has a history of back pain and has previously received an injection for relief, which provided a few months of relief. He continues to experience back pain and stretches daily to manage it.  He has a bunion on his foot that overlaps his second toe, causing discomfort when walking. He has previously discussed surgical options with a podiatrist but has not pursued surgery yet.  Recent lab work showed normal blood cell counts and improved  A1c levels from 5.9 to 5.7. His cholesterol levels and thyroid function were reported in recent lab work. Screening tests for syphilis, HIV, and hepatitis C were performed.  On exam, patient in no acute distress. Right shoulder: Obvious swelling, bruising or erythema: absent Deformity of the shoulder: absent Active ROM: full range with pain Passive ROM: full range with pain Strength: normal/normal Empty can: Positive Hawkins: Positive Neer's: Negative Neurovascular exam: intact  Bunion Assessment & Plan: Has met with podiatry in the past, recommend further evaluation with them.  Referral placed today to assist with scheduling, given that he last saw them a little bit less than a year and a half ago, can reach out to their office to schedule directly as well  Orders: -     Ambulatory referral to Podiatry  Chronic bilateral low back pain, unspecified whether sciatica present Assessment & Plan: Pain persists despite previous injections and stretching exercises. Previous injections provided temporary relief. Considering repeat injections or alternatives. - Referred to orthopedic surgeon for evaluation and potential repeat injections or alternative treatments.  Orders: -     Ambulatory referral to Orthopedic Surgery  Right shoulder pain, unspecified chronicity Assessment & Plan: Pain without recent injury, exacerbated by movement. Previous left shoulder rotator cuff repair. No prior imaging for right shoulder. - Referred to orthopedic surgeon for evaluation.  Orders: -     Ambulatory referral to Orthopedic Surgery  Right lower quadrant abdominal pain Assessment & Plan: Intermittent right lower quadrant pain  with blood in stool. Normal previous colonoscopy.  Given abdominal pain with associated blood in stool noted, recommend further evaluation with GI - Referred to GI specialist for further evaluation.  Orders: -     Ambulatory referral to Gastroenterology  Immunization due -      Flu vaccine trivalent PF, 6mos and older(Flulaval,Afluria,Fluarix,Fluzone)  Prediabetes Assessment & Plan: Recent A1c remains within prediabetes range, however did show improvement from 5.9 down to 5.7 at this time.  Recommend continuing with lifestyle modifications and we will monitor A1c intermittently   Return in about 2 months (around 11/26/2024) for CPE.   ___________________________________________ Ajanae Virag de [place], MD, ABFM, CAQSM Primary Care and Sports Medicine [facility] "

## 2024-11-07 ENCOUNTER — Ambulatory Visit: Admitting: Neurology

## 2024-12-01 ENCOUNTER — Encounter (HOSPITAL_BASED_OUTPATIENT_CLINIC_OR_DEPARTMENT_OTHER): Admitting: Family Medicine
